# Patient Record
Sex: Female | Born: 1935 | Race: White | Hispanic: No | Smoking: Never smoker
Health system: Southern US, Community
[De-identification: ages and names within clinical notes are randomized; demographics above are authoritative.]

## PROBLEM LIST (undated history)

## (undated) DIAGNOSIS — D229 Melanocytic nevi, unspecified: Secondary | ICD-10-CM

## (undated) DIAGNOSIS — I359 Nonrheumatic aortic valve disorder, unspecified: Secondary | ICD-10-CM

## (undated) DIAGNOSIS — N6459 Other signs and symptoms in breast: Secondary | ICD-10-CM

## (undated) DIAGNOSIS — R42 Dizziness and giddiness: Secondary | ICD-10-CM

## (undated) HISTORY — PX: BACK SURGERY: SHX140

## (undated) HISTORY — DX: Nonrheumatic aortic valve disorder, unspecified: I35.9

## (undated) HISTORY — DX: Melanocytic nevi, unspecified: D22.9

## (undated) HISTORY — DX: Dizziness and giddiness: R42

## (undated) HISTORY — PX: APPENDECTOMY: SHX54

## (undated) HISTORY — DX: Other signs and symptoms in breast: N64.59

---

## 2003-09-25 ENCOUNTER — Encounter: Admission: RE | Admit: 2003-09-25 | Discharge: 2003-09-25 | Payer: Self-pay | Admitting: Internal Medicine

## 2004-01-18 ENCOUNTER — Ambulatory Visit: Payer: Self-pay | Admitting: Internal Medicine

## 2004-01-26 ENCOUNTER — Ambulatory Visit: Payer: Self-pay | Admitting: Gastroenterology

## 2004-02-09 ENCOUNTER — Ambulatory Visit: Payer: Self-pay | Admitting: Gastroenterology

## 2004-12-07 ENCOUNTER — Encounter: Admission: RE | Admit: 2004-12-07 | Discharge: 2004-12-07 | Payer: Self-pay | Admitting: Internal Medicine

## 2004-12-26 ENCOUNTER — Ambulatory Visit: Payer: Self-pay | Admitting: Internal Medicine

## 2004-12-30 ENCOUNTER — Encounter: Admission: RE | Admit: 2004-12-30 | Discharge: 2004-12-30 | Payer: Self-pay | Admitting: Internal Medicine

## 2005-07-19 ENCOUNTER — Ambulatory Visit: Payer: Self-pay | Admitting: Internal Medicine

## 2005-07-20 ENCOUNTER — Ambulatory Visit (HOSPITAL_COMMUNITY): Admission: RE | Admit: 2005-07-20 | Discharge: 2005-07-20 | Payer: Self-pay | Admitting: Internal Medicine

## 2005-07-20 ENCOUNTER — Encounter (INDEPENDENT_AMBULATORY_CARE_PROVIDER_SITE_OTHER): Payer: Self-pay | Admitting: *Deleted

## 2005-11-02 ENCOUNTER — Ambulatory Visit: Payer: Self-pay | Admitting: Internal Medicine

## 2006-10-24 ENCOUNTER — Ambulatory Visit: Payer: Self-pay | Admitting: Internal Medicine

## 2007-01-09 DIAGNOSIS — E785 Hyperlipidemia, unspecified: Secondary | ICD-10-CM | POA: Insufficient documentation

## 2007-01-09 DIAGNOSIS — J309 Allergic rhinitis, unspecified: Secondary | ICD-10-CM

## 2007-01-24 ENCOUNTER — Ambulatory Visit: Payer: Self-pay | Admitting: Internal Medicine

## 2007-01-24 DIAGNOSIS — M899 Disorder of bone, unspecified: Secondary | ICD-10-CM | POA: Insufficient documentation

## 2007-01-24 DIAGNOSIS — M545 Low back pain: Secondary | ICD-10-CM

## 2007-01-24 DIAGNOSIS — M949 Disorder of cartilage, unspecified: Secondary | ICD-10-CM

## 2007-01-24 LAB — CONVERTED CEMR LAB
ALT: 17 units/L (ref 0–35)
AST: 19 units/L (ref 0–37)
Albumin: 3.7 g/dL (ref 3.5–5.2)
Alkaline Phosphatase: 83 units/L (ref 39–117)
BUN: 15 mg/dL (ref 6–23)
Basophils Absolute: 0 10*3/uL (ref 0.0–0.1)
Basophils Relative: 0.3 % (ref 0.0–1.0)
Bilirubin, Direct: 0.1 mg/dL (ref 0.0–0.3)
CO2: 29 meq/L (ref 19–32)
Calcium: 9.5 mg/dL (ref 8.4–10.5)
Chloride: 106 meq/L (ref 96–112)
Cholesterol: 217 mg/dL (ref 0–200)
Creatinine, Ser: 1.1 mg/dL (ref 0.4–1.2)
Direct LDL: 141.4 mg/dL
Eosinophils Absolute: 0.3 10*3/uL (ref 0.0–0.6)
Eosinophils Relative: 3.3 % (ref 0.0–5.0)
GFR calc Af Amer: 62 mL/min
GFR calc non Af Amer: 51 mL/min
Glucose, Bld: 96 mg/dL (ref 70–99)
HCT: 38.9 % (ref 36.0–46.0)
HDL: 46.5 mg/dL (ref >39.0)
Hemoglobin: 13.5 g/dL (ref 12.0–15.0)
Lymphocytes Relative: 31.7 % (ref 12.0–46.0)
MCHC: 34.6 g/dL (ref 30.0–36.0)
MCV: 94.4 fL (ref 78.0–100.0)
Monocytes Absolute: 0.6 10*3/uL (ref 0.2–0.7)
Monocytes Relative: 8.3 % (ref 3.0–11.0)
Neutro Abs: 4.4 10*3/uL (ref 1.4–7.7)
Neutrophils Relative %: 56.4 % (ref 43.0–77.0)
Platelets: 214 10*3/uL (ref 150–400)
Potassium: 5.3 meq/L — ABNORMAL HIGH (ref 3.5–5.1)
RBC: 4.12 M/uL (ref 3.87–5.11)
RDW: 12.6 % (ref 11.5–14.6)
Sodium: 142 meq/L (ref 135–145)
TSH: 2 microintl units/mL (ref 0.35–5.50)
Total Bilirubin: 0.6 mg/dL (ref 0.3–1.2)
Total CHOL/HDL Ratio: 4.7
Total Protein: 6.8 g/dL (ref 6.0–8.3)
Triglycerides: 125 mg/dL (ref 0–149)
VLDL: 25 mg/dL (ref 0–40)
WBC: 7.8 10*3/uL (ref 4.5–10.5)

## 2007-02-15 ENCOUNTER — Encounter: Admission: RE | Admit: 2007-02-15 | Discharge: 2007-02-15 | Payer: Self-pay | Admitting: Internal Medicine

## 2007-10-22 ENCOUNTER — Ambulatory Visit: Payer: Self-pay | Admitting: Internal Medicine

## 2007-12-10 ENCOUNTER — Telehealth: Payer: Self-pay | Admitting: Internal Medicine

## 2008-01-07 ENCOUNTER — Ambulatory Visit: Payer: Self-pay | Admitting: Family Medicine

## 2008-02-26 ENCOUNTER — Ambulatory Visit: Payer: Self-pay | Admitting: Internal Medicine

## 2008-02-26 DIAGNOSIS — J069 Acute upper respiratory infection, unspecified: Secondary | ICD-10-CM | POA: Insufficient documentation

## 2008-08-31 ENCOUNTER — Encounter: Payer: Self-pay | Admitting: Internal Medicine

## 2008-08-31 ENCOUNTER — Encounter: Payer: Self-pay | Admitting: Cardiovascular Disease

## 2008-10-06 ENCOUNTER — Ambulatory Visit: Payer: Self-pay | Admitting: Internal Medicine

## 2008-11-23 ENCOUNTER — Telehealth: Payer: Self-pay | Admitting: Internal Medicine

## 2008-11-27 ENCOUNTER — Ambulatory Visit: Payer: Self-pay | Admitting: Internal Medicine

## 2008-11-27 DIAGNOSIS — L821 Other seborrheic keratosis: Secondary | ICD-10-CM

## 2008-12-23 ENCOUNTER — Encounter (INDEPENDENT_AMBULATORY_CARE_PROVIDER_SITE_OTHER): Payer: Self-pay | Admitting: *Deleted

## 2009-05-31 ENCOUNTER — Encounter: Payer: Self-pay | Admitting: Internal Medicine

## 2009-12-06 ENCOUNTER — Encounter: Payer: Self-pay | Admitting: Internal Medicine

## 2009-12-06 ENCOUNTER — Ambulatory Visit: Payer: Self-pay | Admitting: Cardiovascular Disease

## 2010-02-10 NOTE — Letter (Signed)
Summary: [facility]   Imported By: Maryln Gottron 12/14/2009 13:03:03  _____________________________________________________________________  External Attachment:    Type:   Image     Comment:   External Document

## 2010-02-10 NOTE — Letter (Signed)
Summary: [facility]   Imported By: Maryln Gottron 06/04/2009 14:53:49  _____________________________________________________________________  External Attachment:    Type:   Image     Comment:   External Document

## 2010-06-22 ENCOUNTER — Telehealth: Payer: Self-pay | Admitting: *Deleted

## 2010-06-22 DIAGNOSIS — Z1231 Encounter for screening mammogram for malignant neoplasm of breast: Secondary | ICD-10-CM

## 2010-06-22 DIAGNOSIS — Z1239 Encounter for other screening for malignant neoplasm of breast: Secondary | ICD-10-CM

## 2010-06-22 NOTE — Telephone Encounter (Signed)
Spoke with pt - informed that when I try to order - [place] does not come up as a chioce for locations to be done [facility] does. I will send order there and when she schedules she needs to ask if they are within network. KIK

## 2010-06-22 NOTE — Telephone Encounter (Signed)
ok 

## 2010-06-22 NOTE — Telephone Encounter (Signed)
Pt needs order for mammogram sent to [facility]  [facility] is not a Location manager with her Financial controller.

## 2010-06-30 ENCOUNTER — Ambulatory Visit: Payer: Self-pay

## 2010-06-30 ENCOUNTER — Other Ambulatory Visit: Payer: Self-pay | Admitting: Internal Medicine

## 2010-06-30 ENCOUNTER — Ambulatory Visit
Admission: RE | Admit: 2010-06-30 | Discharge: 2010-06-30 | Disposition: A | Discharge status: Home / Self Care | Payer: Medicare Other | Source: Ambulatory Visit | Attending: Internal Medicine | Admitting: Internal Medicine

## 2010-06-30 DIAGNOSIS — Z1231 Encounter for screening mammogram for malignant neoplasm of breast: Secondary | ICD-10-CM

## 2010-06-30 DIAGNOSIS — N649 Disorder of breast, unspecified: Secondary | ICD-10-CM

## 2010-07-07 ENCOUNTER — Ambulatory Visit
Admission: RE | Admit: 2010-07-07 | Discharge: 2010-07-07 | Disposition: A | Discharge status: Home / Self Care | Payer: Medicare Other | Source: Ambulatory Visit | Attending: Internal Medicine | Admitting: Internal Medicine

## 2010-07-07 DIAGNOSIS — N649 Disorder of breast, unspecified: Secondary | ICD-10-CM

## 2010-07-19 ENCOUNTER — Other Ambulatory Visit (INDEPENDENT_AMBULATORY_CARE_PROVIDER_SITE_OTHER): Payer: Self-pay | Admitting: Surgery

## 2010-07-19 ENCOUNTER — Ambulatory Visit
Admission: RE | Admit: 2010-07-19 | Discharge: 2010-07-19 | Disposition: A | Discharge status: Home / Self Care | Payer: Medicare Other | Source: Ambulatory Visit | Attending: Surgery | Admitting: Surgery

## 2010-07-19 ENCOUNTER — Encounter (INDEPENDENT_AMBULATORY_CARE_PROVIDER_SITE_OTHER): Payer: Self-pay | Admitting: Surgery

## 2010-07-19 ENCOUNTER — Ambulatory Visit (INDEPENDENT_AMBULATORY_CARE_PROVIDER_SITE_OTHER): Payer: Medicare Other | Admitting: Surgery

## 2010-07-19 ENCOUNTER — Ambulatory Visit
Admission: RE | Admit: 2010-07-19 | Discharge: 2010-07-19 | Disposition: A | Discharge status: Home / Self Care | Payer: Medicare Other | Source: Ambulatory Visit

## 2010-07-19 VITALS — BP 118/68 | HR 72 | Temp 96.6°F | Ht 64.0 in | Wt 157.6 lb

## 2010-07-19 DIAGNOSIS — N6320 Unspecified lump in the left breast, unspecified quadrant: Secondary | ICD-10-CM

## 2010-07-19 DIAGNOSIS — N6452 Nipple discharge: Secondary | ICD-10-CM | POA: Insufficient documentation

## 2010-07-19 DIAGNOSIS — N63 Unspecified lump in unspecified breast: Secondary | ICD-10-CM

## 2010-07-19 DIAGNOSIS — N6459 Other signs and symptoms in breast: Secondary | ICD-10-CM

## 2010-07-19 NOTE — Progress Notes (Signed)
Subjective:     Patient ID: Natalie Fitzgerald, female   DOB: [birth year], 77 y.o.   MRN: [id number]    BP 118/68  Pulse 72  Temp 96.6 F (35.9 C)  Ht 5\' 4"  (1.626 m)  Wt 157 lb 9.6 oz (71.487 kg)  BMI 27.05 kg/m2    HPI The patient recently noted a tiny nodule in the tip of the right nipple periods otherwise been asymptomatic. She doesn't she's had one deep breasts in the past. She's had no symptoms from the area of the nipple. The patient recently had a mammogram which she says was negative. She came here for surgical evaluation.  Review of Systems  Constitutional: Negative.   HENT: Negative.   Eyes: Negative.   Cardiovascular: Negative.   Gastrointestinal: Negative.   Genitourinary: Negative.   Musculoskeletal: Negative.   Neurological: Negative.   Hematological: Negative.   Psychiatric/Behavioral: Negative.        Objective:   Physical Exam  Constitutional: She is oriented to person, place, and time. She appears well-developed and well-nourished.  HENT:  Head: Normocephalic.  Eyes: Conjunctivae are normal. Pupils are equal, round, and reactive to light.  Neck: Normal range of motion. No thyromegaly present.  Cardiovascular: Normal rate, regular rhythm and normal heart sounds.   Pulmonary/Chest: Effort normal and breath sounds normal. Right breast exhibits nipple discharge. Right breast exhibits no inverted nipple and no tenderness. Left breast exhibits mass. Left breast exhibits no inverted nipple, no nipple discharge, no skin change and no tenderness. Breasts are symmetrical.         Tiny white 1mm nodule tip of R nipple, looks like dilated gland. Small amount clear nipple dc R. Mass like area high L UOQ  Abdominal: Soft. She exhibits no distension and no mass. There is no tenderness.  Neurological: She is alert and oriented to person, place, and time.  Skin: Skin is warm and dry.  Psychiatric: She has a normal mood and affect. Her behavior is normal. Judgment and  thought content normal.   Data reviewed: I went over her old records and attic as well as the mammogram an old ultrasound reports.   Assessment:     This small nodular density on tip of nipple which appears benign and likely were present a slightly dilated land. A small amount of clear nipple discharge is likewise benign she she has no spontaneous discharge. There is a soft about 3 cm masslike effect in the upper outer quadrant of the left breast. She says that's been there previously but to me it is slightly suspicious although it thank benign.    Plan:     I don't think we need to do anything with the small larger area on the tip of the nipple nor the nipple discharge was to become spontaneous. We are going to obtain an ultrasound of the upper outer quadrant of the left breast to be sure there is not a small mass there. It is negative she can just continue followup with her primary physician.

## 2010-07-19 NOTE — Patient Instructions (Signed)
Go to the breast center now for an ultrasoound of the left breast. If its OK we will see you again on an as needed basis

## 2010-09-13 ENCOUNTER — Encounter: Payer: Self-pay | Admitting: Internal Medicine

## 2010-09-13 ENCOUNTER — Ambulatory Visit (INDEPENDENT_AMBULATORY_CARE_PROVIDER_SITE_OTHER): Payer: Medicare Other | Admitting: Internal Medicine

## 2010-09-13 DIAGNOSIS — I10 Essential (primary) hypertension: Secondary | ICD-10-CM

## 2010-09-13 DIAGNOSIS — M899 Disorder of bone, unspecified: Secondary | ICD-10-CM

## 2010-09-13 MED ORDER — AMLODIPINE BESYLATE 2.5 MG PO TABS: 2.5000 mg | ORAL_TABLET | Freq: Every day | ORAL | Status: DC

## 2010-09-13 NOTE — Progress Notes (Signed)
  Subjective:    Patient ID: Natalie Fitzgerald, female    DOB: 02-11-1927, 77 y.o.   MRN: [id number]  HPI  77 year old patient who is seen today for followup. She has a history of mild dyslipidemia osteopenia and treated hypertension. Her blood pressure meds well controlled on amlodipine 2.5 mg daily. She has recently been seen by general surgery for a benign left breast lesion. She is doing quite well. He denies any cardiopulmonary complaints.    Review of Systems  Constitutional: Negative.   HENT: Negative for hearing loss, congestion, sore throat, rhinorrhea, dental problem, sinus pressure and tinnitus.   Eyes: Negative for pain, discharge and visual disturbance.  Respiratory: Negative for cough and shortness of breath.   Cardiovascular: Negative for chest pain, palpitations and leg swelling.  Gastrointestinal: Negative for nausea, vomiting, abdominal pain, diarrhea, constipation, blood in stool and abdominal distention.  Genitourinary: Negative for dysuria, urgency, frequency, hematuria, flank pain, vaginal bleeding, vaginal discharge, difficulty urinating, vaginal pain and pelvic pain.  Musculoskeletal: Negative for joint swelling, arthralgias and gait problem.  Skin: Negative for rash.  Neurological: Negative for dizziness, syncope, speech difficulty, weakness, numbness and headaches.  Hematological: Negative for adenopathy.  Psychiatric/Behavioral: Negative for behavioral problems, dysphoric mood and agitation. The patient is not nervous/anxious.        Objective:   Physical Exam  Constitutional: She is oriented to person, place, and time. She appears well-developed and well-nourished.  HENT:  Head: Normocephalic.  Right Ear: External ear normal.  Left Ear: External ear normal.  Mouth/Throat: Oropharynx is clear and moist.  Eyes: Conjunctivae and EOM are normal. Pupils are equal, round, and reactive to light.  Neck: Normal range of motion. Neck supple. No thyromegaly present.    Cardiovascular: Normal rate, regular rhythm and normal heart sounds.        Diminished right dorsalis pedis pulse  Pulmonary/Chest: Effort normal and breath sounds normal.  Abdominal: Soft. Bowel sounds are normal. She exhibits no mass. There is no tenderness.  Musculoskeletal: Normal range of motion.  Lymphadenopathy:    She has no cervical adenopathy.  Neurological: She is alert and oriented to person, place, and time.  Skin: Skin is warm and dry. No rash noted.  Psychiatric: She has a normal mood and affect. Her behavior is normal.          Assessment & Plan:   Hypertension. Well controlled Dyslipidemia  We'll schedule for a complete physical

## 2010-09-13 NOTE — Patient Instructions (Signed)
Limit your sodium (Salt) intake  Take a calcium supplement, plus 800-1200 units of vitamin D    It is important that you exercise regularly, at least 20 minutes 3 to 4 times per week.  If you develop chest pain or shortness of breath seek  medical attention.   

## 2010-10-31 ENCOUNTER — Telehealth: Payer: Self-pay | Admitting: Internal Medicine

## 2010-10-31 NOTE — Telephone Encounter (Signed)
30 min appt 

## 2010-10-31 NOTE — Telephone Encounter (Signed)
Daughter called and said that her mom may have slight dementia and/or Alzheimers. May need to be seen. 15 or 30 minutes for appt?

## 2011-01-19 ENCOUNTER — Encounter: Payer: Self-pay | Admitting: Gastroenterology

## 2011-06-01 ENCOUNTER — Other Ambulatory Visit: Payer: Self-pay | Admitting: Internal Medicine

## 2011-06-01 DIAGNOSIS — Z1231 Encounter for screening mammogram for malignant neoplasm of breast: Secondary | ICD-10-CM

## 2011-07-04 ENCOUNTER — Ambulatory Visit
Admission: RE | Admit: 2011-07-04 | Discharge: 2011-07-04 | Disposition: A | Discharge status: Home / Self Care | Payer: Medicare Other | Source: Ambulatory Visit | Attending: Internal Medicine | Admitting: Internal Medicine

## 2011-07-04 DIAGNOSIS — Z1231 Encounter for screening mammogram for malignant neoplasm of breast: Secondary | ICD-10-CM

## 2011-10-13 ENCOUNTER — Encounter: Payer: Self-pay | Admitting: Gastroenterology

## 2011-11-05 ENCOUNTER — Other Ambulatory Visit: Payer: Self-pay | Admitting: Internal Medicine

## 2011-11-30 ENCOUNTER — Ambulatory Visit (INDEPENDENT_AMBULATORY_CARE_PROVIDER_SITE_OTHER): Payer: Medicare Other | Admitting: Internal Medicine

## 2011-11-30 ENCOUNTER — Encounter: Payer: Self-pay | Admitting: Internal Medicine

## 2011-11-30 VITALS — BP 160/80 | HR 72 | Temp 98.1°F | Resp 18 | Wt 160.0 lb

## 2011-11-30 DIAGNOSIS — Z23 Encounter for immunization: Secondary | ICD-10-CM

## 2011-11-30 DIAGNOSIS — J309 Allergic rhinitis, unspecified: Secondary | ICD-10-CM

## 2011-11-30 DIAGNOSIS — E785 Hyperlipidemia, unspecified: Secondary | ICD-10-CM

## 2011-11-30 DIAGNOSIS — I10 Essential (primary) hypertension: Secondary | ICD-10-CM

## 2011-11-30 LAB — COMPREHENSIVE METABOLIC PANEL
ALT: 14 U/L (ref 0–35)
AST: 21 U/L (ref 0–37)
Albumin: 3.8 g/dL (ref 3.5–5.2)
Alkaline Phosphatase: 89 U/L (ref 39–117)
BUN: 30 mg/dL — ABNORMAL HIGH (ref 6–23)
CO2: 27 mEq/L (ref 19–32)
Chloride: 104 mEq/L (ref 96–112)
Glucose, Bld: 119 mg/dL — ABNORMAL HIGH (ref 70–99)
Potassium: 5.1 mEq/L (ref 3.5–5.1)
Sodium: 137 mEq/L (ref 135–145)
Total Protein: 7.4 g/dL (ref 6.0–8.3)

## 2011-11-30 LAB — CBC WITH DIFFERENTIAL/PLATELET
Basophils Absolute: 0 10*3/uL (ref 0.0–0.1)
Basophils Relative: 0.3 % (ref 0.0–3.0)
Eosinophils Absolute: 0.3 10*3/uL (ref 0.0–0.7)
Eosinophils Relative: 3.4 % (ref 0.0–5.0)
Hemoglobin: 12.3 g/dL (ref 12.0–15.0)
Lymphocytes Relative: 24.5 % (ref 12.0–46.0)
Lymphs Abs: 2.2 10*3/uL (ref 0.7–4.0)
MCHC: 32.7 g/dL (ref 30.0–36.0)
MCV: 96.4 fl (ref 78.0–100.0)
Monocytes Absolute: 0.6 10*3/uL (ref 0.1–1.0)
Monocytes Relative: 6.4 % (ref 3.0–12.0)
Neutro Abs: 5.9 10*3/uL (ref 1.4–7.7)
Neutrophils Relative %: 65.4 % (ref 43.0–77.0)
RBC: 3.91 Mil/uL (ref 3.87–5.11)
RDW: 13.7 % (ref 11.5–14.6)
WBC: 9.1 10*3/uL (ref 4.5–10.5)

## 2011-11-30 LAB — LIPID PANEL
Cholesterol: 198 mg/dL (ref 0–200)
LDL Cholesterol: 119 mg/dL — ABNORMAL HIGH (ref 0–99)
Total CHOL/HDL Ratio: 4
Triglycerides: 156 mg/dL — ABNORMAL HIGH (ref 0.0–149.0)

## 2011-11-30 MED ORDER — AMLODIPINE BESYLATE 2.5 MG PO TABS: 2.5000 mg | ORAL_TABLET | Freq: Every day | ORAL | Status: DC

## 2011-11-30 NOTE — Progress Notes (Signed)
Subjective:    Patient ID: Natalie Fitzgerald, female    DOB: 04-08-1927, 78 y.o.   MRN: [id number]  HPI  78 year old patient who is seen today for followup. She has not been seen here in over one year. She has treated hypertension which has done quite well. She has mild dyslipidemia history of allergic rhinitis and mild osteoarthritis. Doing quite well no major concerns or complaints  Past Medical History  Diagnosis Date  . Nipple problem   . Aortic valve disorders     hardening of valve`  . Atypical mole     nose  . Glaucoma(365)   . Dizziness     History   Social History  . Marital Status: Married    Spouse Name: N/A    Number of Children: N/A  . Years of Education: N/A   Occupational History  . Not on file.   Social History Main Topics  . Smoking status: Never Smoker   . Smokeless tobacco: Not on file  . Alcohol Use: No  . Drug Use: No  . Sexually Active:    Other Topics Concern  . Not on file   Social History Narrative  . No narrative on file    Past Surgical History  Procedure Date  . Appendectomy   . Back surgery     Family History  Problem Relation Age of Onset  . Cancer Mother     kidney  . Alcohol abuse Father     No Known Allergies  Current Outpatient Prescriptions on File Prior to Visit  Medication Sig Dispense Refill  . amLODipine (NORVASC) 2.5 MG tablet TAKE ONE TABLET BY MOUTH EVERY DAY  90 tablet  0  . aspirin 81 MG tablet Take 81 mg by mouth daily.        . Cholecalciferol (VITAMIN D-3 PO) Take 500 mg by mouth daily.        . Multiple Vitamin (MULTIVITAMINS PO) Take by mouth daily.          BP 160/80  Pulse 72  Temp 98.1 F (36.7 C) (Oral)  Resp 18  Wt 160 lb (72.576 kg)  SpO2 97%      Review of Systems  Constitutional: Negative.   HENT: Negative for hearing loss, congestion, sore throat, rhinorrhea, dental problem, sinus pressure and tinnitus.   Eyes: Negative for pain, discharge and visual disturbance.  Respiratory:  Negative for cough and shortness of breath.   Cardiovascular: Negative for chest pain, palpitations and leg swelling.  Gastrointestinal: Negative for nausea, vomiting, abdominal pain, diarrhea, constipation, blood in stool and abdominal distention.  Genitourinary: Negative for dysuria, urgency, frequency, hematuria, flank pain, vaginal bleeding, vaginal discharge, difficulty urinating, vaginal pain and pelvic pain.  Musculoskeletal: Negative for joint swelling, arthralgias and gait problem.  Skin: Negative for rash.  Neurological: Negative for dizziness, syncope, speech difficulty, weakness, numbness and headaches.  Hematological: Negative for adenopathy.  Psychiatric/Behavioral: Negative for behavioral problems, dysphoric mood and agitation. The patient is not nervous/anxious.        Objective:   Physical Exam  Constitutional: She is oriented to person, place, and time. She appears well-developed and well-nourished.       Repeat blood pressure 130/60  HENT:  Head: Normocephalic.  Right Ear: External ear normal.  Left Ear: External ear normal.  Mouth/Throat: Oropharynx is clear and moist.  Eyes: Conjunctivae normal and EOM are normal. Pupils are equal, round, and reactive to light.  Neck: Normal range of motion. Neck supple. No thyromegaly  present.  Cardiovascular: Normal rate, regular rhythm and normal heart sounds.        Pedal pulses full except absent right dorsalis pedis pulse  Pulmonary/Chest: Effort normal and breath sounds normal.  Abdominal: Soft. Bowel sounds are normal. She exhibits no mass. There is no tenderness.  Musculoskeletal: Normal range of motion.  Lymphadenopathy:    She has no cervical adenopathy.  Neurological: She is alert and oriented to person, place, and time.  Skin: Skin is warm and dry. No rash noted.  Psychiatric: She has a normal mood and affect. Her behavior is normal.          Assessment & Plan:   Hypertension well controlled. Repeat blood  pressure 130/60 History dyslipidemia Osteopenia allergic rhinitis   Medications refilled Lab updated  CPX one year

## 2011-11-30 NOTE — Patient Instructions (Signed)
Limit your sodium (Salt) intake    It is important that you exercise regularly, at least 20 minutes 3 to 4 times per week.  If you develop chest pain or shortness of breath seek  medical attention.  Return in one year for follow-up  Take a calcium supplement, plus 800-1200 units of vitamin D 

## 2012-12-23 ENCOUNTER — Ambulatory Visit (INDEPENDENT_AMBULATORY_CARE_PROVIDER_SITE_OTHER): Payer: Medicare Other | Admitting: Family

## 2012-12-23 ENCOUNTER — Encounter: Payer: Self-pay | Admitting: Family

## 2012-12-23 VITALS — BP 142/80 | HR 71 | Ht 62.0 in | Wt 160.0 lb

## 2012-12-23 DIAGNOSIS — I10 Essential (primary) hypertension: Secondary | ICD-10-CM

## 2012-12-23 DIAGNOSIS — E785 Hyperlipidemia, unspecified: Secondary | ICD-10-CM

## 2012-12-23 DIAGNOSIS — Z23 Encounter for immunization: Secondary | ICD-10-CM

## 2012-12-23 DIAGNOSIS — R82998 Other abnormal findings in urine: Secondary | ICD-10-CM

## 2012-12-23 DIAGNOSIS — R8290 Unspecified abnormal findings in urine: Secondary | ICD-10-CM

## 2012-12-23 DIAGNOSIS — Z0000 Encounter for general adult medical examination without abnormal findings: Secondary | ICD-10-CM

## 2012-12-23 DIAGNOSIS — J309 Allergic rhinitis, unspecified: Secondary | ICD-10-CM

## 2012-12-23 LAB — POCT URINALYSIS DIPSTICK
Bilirubin, UA: NEGATIVE
Blood, UA: NEGATIVE
Glucose, UA: NEGATIVE
Ketones, UA: NEGATIVE
Nitrite, UA: POSITIVE
Protein, UA: NEGATIVE
Spec Grav, UA: 1.02
Urobilinogen, UA: 0.2
pH, UA: 5.5

## 2012-12-23 MED ORDER — AMLODIPINE BESYLATE 2.5 MG PO TABS: 2.5000 mg | ORAL_TABLET | Freq: Every day | ORAL | Status: DC

## 2012-12-23 NOTE — Progress Notes (Signed)
Subjective:    Patient ID: Natalie Fitzgerald, female    DOB: Oct 01, 1927, 79 y.o.   MRN: [id number]  HPI 79 year old white female, nonsmoker, patient of Dr. Kirtland Bouchard is in for a routine physical examination for this healthy  Female. Reviewed all health maintenance protocols including mammography colonoscopy bone density and reviewed appropriate screening labs. Her immunization history was reviewed as well as her current medications and allergies refills of her chronic medications were given and the plan for yearly health maintenance was discussed all orders and referrals were made as appropriate.   Review of Systems  Constitutional: Negative.   HENT: Negative.   Eyes: Negative.   Respiratory: Negative.   Cardiovascular: Negative.   Gastrointestinal: Negative.   Endocrine: Negative.   Genitourinary: Negative.   Musculoskeletal: Negative.   Skin: Negative.   Allergic/Immunologic: Negative.   Neurological: Negative.   Hematological: Negative.   Psychiatric/Behavioral: Negative.    Past Medical History  Diagnosis Date  . Nipple problem   . Aortic valve disorders     hardening of valve`  . Atypical mole     nose  . Glaucoma   . Dizziness     History   Social History  . Marital Status: Married    Spouse Name: N/A    Number of Children: N/A  . Years of Education: N/A   Occupational History  . Not on file.   Social History Main Topics  . Smoking status: Never Smoker   . Smokeless tobacco: Not on file  . Alcohol Use: No  . Drug Use: No  . Sexual Activity:    Other Topics Concern  . Not on file   Social History Narrative  . No narrative on file    Past Surgical History  Procedure Laterality Date  . Appendectomy    . Back surgery      Family History  Problem Relation Age of Onset  . Cancer Mother     kidney  . Alcohol abuse Father     No Known Allergies  Current Outpatient Prescriptions on File Prior to Visit  Medication Sig Dispense Refill  . aspirin 81 MG  tablet Take 81 mg by mouth daily.        . Cholecalciferol (VITAMIN D-3 PO) Take 500 mg by mouth daily.        . Multiple Vitamin (MULTIVITAMINS PO) Take by mouth daily.         No current facility-administered medications on file prior to visit.    BP 142/80  Pulse 71  Ht 5\' 2"  (1.575 m)  Wt 160 lb (72.576 kg)  BMI 29.26 kg/m2chart    Objective:   Physical Exam  Constitutional: She is oriented to person, place, and time. She appears well-developed and well-nourished.  HENT:  Head: Normocephalic.  Right Ear: External ear normal.  Left Ear: External ear normal.  Nose: Nose normal.  Mouth/Throat: Oropharynx is clear and moist.  Eyes: Conjunctivae and EOM are normal. Pupils are equal, round, and reactive to light.  Neck: Normal range of motion. Neck supple. No thyromegaly present.  Cardiovascular: Normal rate, regular rhythm and normal heart sounds.   Pulmonary/Chest: Effort normal and breath sounds normal.  Abdominal: Soft. Bowel sounds are normal. She exhibits no distension. There is no tenderness. There is no rebound.  Musculoskeletal: She exhibits no edema and no tenderness.  Neurological: She is alert and oriented to person, place, and time. She has normal reflexes. She displays normal reflexes. No cranial nerve deficit. Coordination  normal.  Skin: Skin is warm and dry.  Psychiatric: She has a normal mood and affect.          Assessment & Plan:  Assessment: 1. Complete Physical Exam 2. Hyperlipidemia 3. Hypertension   Plan: Continue current medications. Norvasc refill. Labs and to include UA, CBC, BMP, LFTs, and lipid. The patient and the results. Patient declines mammogram which is to wait till June of next year. Advised her to continue self breast exams and notify us with any issues.Was given tetanus and flu immunization.

## 2012-12-23 NOTE — Patient Instructions (Signed)

## 2012-12-24 LAB — CBC WITH DIFFERENTIAL/PLATELET
Basophils Absolute: 0.1 10*3/uL (ref 0.0–0.1)
Basophils Relative: 1 % (ref 0.0–3.0)
Eosinophils Absolute: 0.4 10*3/uL (ref 0.0–0.7)
Eosinophils Relative: 4.6 % (ref 0.0–5.0)
HCT: 35.3 % — ABNORMAL LOW (ref 36.0–46.0)
Hemoglobin: 11.9 g/dL — ABNORMAL LOW (ref 12.0–15.0)
Lymphocytes Relative: 28.4 % (ref 12.0–46.0)
Lymphs Abs: 2.5 10*3/uL (ref 0.7–4.0)
MCHC: 33.8 g/dL (ref 30.0–36.0)
MCV: 93.1 fl (ref 78.0–100.0)
Monocytes Absolute: 0.5 10*3/uL (ref 0.1–1.0)
Monocytes Relative: 5.8 % (ref 3.0–12.0)
Neutro Abs: 5.4 10*3/uL (ref 1.4–7.7)
Neutrophils Relative %: 60.2 % (ref 43.0–77.0)
Platelets: 208 10*3/uL (ref 150.0–400.0)
RBC: 3.79 Mil/uL — ABNORMAL LOW (ref 3.87–5.11)
RDW: 13.6 % (ref 11.5–14.6)
WBC: 8.9 10*3/uL (ref 4.5–10.5)

## 2012-12-24 LAB — BASIC METABOLIC PANEL
BUN: 30 mg/dL — ABNORMAL HIGH (ref 6–23)
CO2: 25 mEq/L (ref 19–32)
Calcium: 9.7 mg/dL (ref 8.4–10.5)
Chloride: 104 mEq/L (ref 96–112)
Creatinine, Ser: 1.5 mg/dL — ABNORMAL HIGH (ref 0.4–1.2)
GFR: 34.71 mL/min — ABNORMAL LOW (ref >60.00)
Glucose, Bld: 94 mg/dL (ref 70–99)
Potassium: 5.2 mEq/L — ABNORMAL HIGH (ref 3.5–5.1)
Sodium: 136 mEq/L (ref 135–145)

## 2012-12-24 LAB — HEPATIC FUNCTION PANEL
ALT: 15 U/L (ref 0–35)
AST: 22 U/L (ref 0–37)
Albumin: 3.8 g/dL (ref 3.5–5.2)
Alkaline Phosphatase: 84 U/L (ref 39–117)
Bilirubin, Direct: 0.1 mg/dL (ref 0.0–0.3)
Total Bilirubin: 0.4 mg/dL (ref 0.3–1.2)
Total Protein: 6.6 g/dL (ref 6.0–8.3)

## 2012-12-24 LAB — TSH: TSH: 2.91 u[IU]/mL (ref 0.35–5.50)

## 2012-12-24 LAB — LIPID PANEL
Cholesterol: 207 mg/dL — ABNORMAL HIGH (ref 0–200)
HDL: 46.2 mg/dL (ref >39.00)
Total CHOL/HDL Ratio: 4
VLDL: 42.6 mg/dL — ABNORMAL HIGH (ref 0.0–40.0)

## 2012-12-24 NOTE — Addendum Note (Signed)
Addended by: Rita Ohara R on: 12/24/2012 12:57 PM   Modules accepted: Orders

## 2012-12-26 ENCOUNTER — Telehealth: Payer: Self-pay

## 2012-12-26 LAB — URINE CULTURE

## 2012-12-26 MED ORDER — SULFAMETHOXAZOLE-TMP DS 800-160 MG PO TABS: 1.0000 | ORAL_TABLET | Freq: Two times a day (BID) | ORAL | Status: DC

## 2012-12-26 NOTE — Telephone Encounter (Signed)
Pt aware and verbalized understanding.  

## 2012-12-26 NOTE — Telephone Encounter (Signed)
Message copied by Beverely Low on Thu Dec 26, 2012  9:19 AM ------      Message from: Adline Mango B      Created: Thu Dec 26, 2012  8:42 AM       Positive uti ------

## 2013-01-03 ENCOUNTER — Emergency Department (HOSPITAL_BASED_OUTPATIENT_CLINIC_OR_DEPARTMENT_OTHER)
Admission: EM | Admit: 2013-01-03 | Discharge: 2013-01-03 | Disposition: A | Discharge status: Home / Self Care | Payer: Medicare Other | Attending: Emergency Medicine | Admitting: Emergency Medicine

## 2013-01-03 ENCOUNTER — Emergency Department (HOSPITAL_BASED_OUTPATIENT_CLINIC_OR_DEPARTMENT_OTHER): Payer: Medicare Other

## 2013-01-03 ENCOUNTER — Encounter (HOSPITAL_BASED_OUTPATIENT_CLINIC_OR_DEPARTMENT_OTHER): Payer: Self-pay | Admitting: Emergency Medicine

## 2013-01-03 DIAGNOSIS — Z8679 Personal history of other diseases of the circulatory system: Secondary | ICD-10-CM | POA: Insufficient documentation

## 2013-01-03 DIAGNOSIS — Y929 Unspecified place or not applicable: Secondary | ICD-10-CM | POA: Insufficient documentation

## 2013-01-03 DIAGNOSIS — Z8669 Personal history of other diseases of the nervous system and sense organs: Secondary | ICD-10-CM | POA: Insufficient documentation

## 2013-01-03 DIAGNOSIS — S8990XA Unspecified injury of unspecified lower leg, initial encounter: Secondary | ICD-10-CM | POA: Insufficient documentation

## 2013-01-03 DIAGNOSIS — Z7982 Long term (current) use of aspirin: Secondary | ICD-10-CM | POA: Insufficient documentation

## 2013-01-03 DIAGNOSIS — Y9389 Activity, other specified: Secondary | ICD-10-CM | POA: Insufficient documentation

## 2013-01-03 DIAGNOSIS — M25561 Pain in right knee: Secondary | ICD-10-CM

## 2013-01-03 DIAGNOSIS — Z79899 Other long term (current) drug therapy: Secondary | ICD-10-CM | POA: Insufficient documentation

## 2013-01-03 MED ORDER — CYCLOBENZAPRINE HCL 5 MG PO TABS: 5.0000 mg | ORAL_TABLET | Freq: Three times a day (TID) | ORAL | Status: DC | PRN

## 2013-01-03 MED ORDER — IBUPROFEN 600 MG PO TABS: 600.0000 mg | ORAL_TABLET | Freq: Four times a day (QID) | ORAL | Status: DC | PRN

## 2013-01-03 NOTE — ED Notes (Signed)
Pt c/o right knee injury getting into car x 1 hr ago

## 2013-01-03 NOTE — ED Provider Notes (Signed)
CSN: [id number]     Arrival date & time 01/03/13  4098 History  This chart was scribed for non-physician practitioner Elpidio Anis working with Hurman Horn, MD by Carl Best, ED Scribe. This patient was seen in room [facility]2/[facility] and the patient's care was started at 9:09 PM.     Chief Complaint  Patient presents with  . Knee Pain    Patient is a 79 y.o. female presenting with knee pain. The history is provided by the patient. No language interpreter was used.  Knee Pain  HPI Comments: Natalie Fitzgerald is a 79 y.o. female who presents to the Emergency Department complaining of constant right knee pain that started last night while the patient was lying on her right side.  She states that when she turned over to her left side, the right knee pain subsided.  She states that while getting into her car this afternoon, the right knee pain returned and radiated to her right heel.  She states that the pain continued throughout the day but has subsided since being in the ED.  She states that the ACE wrap helped alleviate her right knee pain.  The patient states that elevating her right leg helped alleviate her right heel pain.  She denies experiencing these symptoms in the past.  The patient denies having any allergies to medications.  The patient denies seeing an orthopedist int he past.  The patient states that she lives by herself.   Past Medical History  Diagnosis Date  . Nipple problem   . Aortic valve disorders     hardening of valve`  . Atypical mole     nose  . Glaucoma   . Dizziness    Past Surgical History  Procedure Laterality Date  . Appendectomy    . Back surgery     Family History  Problem Relation Age of Onset  . Cancer Mother     kidney  . Alcohol abuse Father    History  Substance Use Topics  . Smoking status: Never Smoker   . Smokeless tobacco: Not on file  . Alcohol Use: No   OB History   Grav Para Term Preterm Abortions TAB SAB Ect Mult Living                  Review of Systems  Musculoskeletal: Positive for arthralgias (right knee).  All other systems reviewed and are negative.    Allergies  Review of patient's allergies indicates no known allergies.  Home Medications   Current Outpatient Rx  Name  Route  Sig  Dispense  Refill  . amLODipine (NORVASC) 2.5 MG tablet   Oral   Take 1 tablet (2.5 mg total) by mouth daily.   90 tablet   6     Dispense as written.   Marland Kitchen aspirin 81 MG tablet   Oral   Take 81 mg by mouth daily.           . Cholecalciferol (VITAMIN D-3 PO)   Oral   Take 500 mg by mouth daily.           . Multiple Vitamin (MULTIVITAMINS PO)   Oral   Take by mouth daily.           Marland Kitchen sulfamethoxazole-trimethoprim (BACTRIM DS) 800-160 MG per tablet   Oral   Take 1 tablet by mouth 2 (two) times daily.   14 tablet   0    Triage Vitals: BP 143/76  Pulse 92  Temp(Src)  98 F (36.7 C) (Oral)  Ht 5\' 3"  (1.6 m)  Wt 160 lb (72.576 kg)  BMI 28.35 kg/m2  SpO2 98%  Physical Exam  Nursing note and vitals reviewed. Constitutional: She is oriented to person, place, and time. She appears well-developed and well-nourished.  HENT:  Head: Normocephalic and atraumatic.  Eyes: Conjunctivae and EOM are normal. Pupils are equal, round, and reactive to light.  Neck: Normal range of motion and phonation normal. Neck supple.  Cardiovascular:  Distal pulses intact.    Pulmonary/Chest: Effort normal.  Musculoskeletal: Normal range of motion.  Right knee no swelling.  No redness.  Joint stable.  Focal tenderness to the posterolateral aspect of the knee without proximal or distal tenderness.    No tenderness or swelling to the right heel or foot.  Neurological: She is alert and oriented to person, place, and time. She exhibits normal muscle tone.  Skin: Skin is warm and dry.  Psychiatric: She has a normal mood and affect. Her behavior is normal. Judgment and thought content normal.    ED Course  Procedures (including  critical care time)  DIAGNOSTIC STUDIES: Oxygen Saturation is 98% on room air, normal by my interpretation.    COORDINATION OF CARE: 9:12 PM- Discussed a clinical suspicion of a soft tissue pain.  Discussed discharging the patient with an immobilizer and a muscle relaxant.  Advised the patient to follow up with an orthopedist and to take Motrin or Advil.  The patient agreed to the treatment plan.    Labs Review Labs Reviewed - No data to display Imaging Review Dg Knee Complete 4 Views Right  01/03/2013   CLINICAL DATA:  Twisting injury to the right knee.  Pain.  EXAM: RIGHT KNEE - COMPLETE 4+ VIEW  COMPARISON:  None.  FINDINGS: No fracture. The knee joint is normally aligned. There is mild narrowing of the patellofemoral joint space compartment. Chondrocalcinosis is noted along the menisci. Minimal marginal osteophytes project from the superior and inferior patella.  The bones are demineralized. There is no joint effusion. The soft tissues are unremarkable.  IMPRESSION: No fracture or dislocation or acute finding.   Electronically Signed   By: Amie Portland M.D.   On: 01/03/2013 20:03    EKG Interpretation   None       MDM  No diagnosis found. 1. Right knee pain\  Suspect pain is related to muscle or ligamentous strain. Doubt DVT, articular effusion. Knee immobilizer with improved comfort. Ambulatory. Desires to go home. Recommended ortho follow up.  I personally performed the services described in this documentation, which was scribed in my presence. The recorded information has been reviewed and is accurate.     Arnoldo Hooker, PA-C 01/04/13 0005

## 2013-01-04 NOTE — ED Provider Notes (Signed)
Medical screening examination/treatment/procedure(s) were performed by non-physician practitioner and as supervising physician I was immediately available for consultation/collaboration.   Kert Shackett M Tierre Gerard, MD 01/04/13 1332 

## 2013-07-22 ENCOUNTER — Encounter: Payer: Self-pay | Admitting: Internal Medicine

## 2013-07-22 ENCOUNTER — Ambulatory Visit (INDEPENDENT_AMBULATORY_CARE_PROVIDER_SITE_OTHER): Payer: Commercial Managed Care - HMO | Admitting: Internal Medicine

## 2013-07-22 VITALS — BP 122/76 | HR 65 | Temp 98.2°F | Resp 20 | Ht 63.0 in | Wt 154.0 lb

## 2013-07-22 DIAGNOSIS — I10 Essential (primary) hypertension: Secondary | ICD-10-CM

## 2013-07-22 DIAGNOSIS — J309 Allergic rhinitis, unspecified: Secondary | ICD-10-CM

## 2013-07-22 DIAGNOSIS — L989 Disorder of the skin and subcutaneous tissue, unspecified: Secondary | ICD-10-CM

## 2013-07-22 NOTE — Progress Notes (Signed)
Pre visit review using our clinic review tool, if applicable. No additional management support is needed unless otherwise documented below in the visit note. 

## 2013-07-22 NOTE — Patient Instructions (Signed)
Limit your sodium (Salt) intake  Please check your blood pressure on a regular basis.  If it is consistently greater than 150/90, please make an office appointment.  Return in 6 months for follow-up and CPX  Use hydrocortisone 1% cream applied twice daily to the left side of her nose for 10 days only (do  not use chronically)  will need dermatology referral.  If rash persists or reoccurs

## 2013-07-22 NOTE — Progress Notes (Signed)
Subjective:    Patient ID: Natalie Fitzgerald, female    DOB: 1928/01/07, 80 y.o.   MRN: [id number]  HPI 80 year old patient who has a history of hypertension and also history of allergic rhinitis.  She presents today with a chief complaint of a lesion involving the left side of her nose.  She states that approximately 15-20 years ago.  She was seen by dermatology for removal of a "precancerous dark mole"involving the same area.  For the past 3 months.  She has had some chronic scaliness involving this area.  That has been refractory to moisturizing creams and lotions Past Medical History  Diagnosis Date  . Nipple problem   . Aortic valve disorders     hardening of valve`  . Atypical mole     nose  . Glaucoma   . Dizziness     History   Social History  . Marital Status: Married    Spouse Name: N/A    Number of Children: N/A  . Years of Education: N/A   Occupational History  . Not on file.   Social History Main Topics  . Smoking status: Never Smoker   . Smokeless tobacco: Not on file  . Alcohol Use: No  . Drug Use: No  . Sexual Activity:    Other Topics Concern  . Not on file   Social History Narrative  . No narrative on file    Past Surgical History  Procedure Laterality Date  . Appendectomy    . Back surgery      Family History  Problem Relation Age of Onset  . Cancer Mother     kidney  . Alcohol abuse Father     No Known Allergies  Current Outpatient Prescriptions on File Prior to Visit  Medication Sig Dispense Refill  . amLODipine (NORVASC) 2.5 MG tablet Take 1 tablet (2.5 mg total) by mouth daily.  90 tablet  6  . aspirin 81 MG tablet Take 81 mg by mouth daily.        . Cholecalciferol (VITAMIN D-3 PO) Take 500 mg by mouth daily.        . cyclobenzaprine (FLEXERIL) 5 MG tablet Take 1 tablet (5 mg total) by mouth 3 (three) times daily as needed for muscle spasms.  30 tablet  0  . ibuprofen (ADVIL,MOTRIN) 600 MG tablet Take 1 tablet (600 mg total) by  mouth every 6 (six) hours as needed.  30 tablet  0  . Multiple Vitamin (MULTIVITAMINS PO) Take by mouth daily.         No current facility-administered medications on file prior to visit.    BP 122/76  Pulse 65  Temp(Src) 98.2 F (36.8 C) (Oral)  Resp 20  Ht 5\' 3"  (1.6 m)  Wt 154 lb (69.854 kg)  BMI 27.29 kg/m2  SpO2 98%      Review of Systems  Constitutional: Negative.   HENT: Negative for congestion, dental problem, hearing loss, rhinorrhea, sinus pressure, sore throat and tinnitus.   Eyes: Negative for pain, discharge and visual disturbance.  Respiratory: Negative for cough and shortness of breath.   Cardiovascular: Negative for chest pain, palpitations and leg swelling.  Gastrointestinal: Negative for nausea, vomiting, abdominal pain, diarrhea, constipation, blood in stool and abdominal distention.  Genitourinary: Negative for dysuria, urgency, frequency, hematuria, flank pain, vaginal bleeding, vaginal discharge, difficulty urinating, vaginal pain and pelvic pain.  Musculoskeletal: Negative for arthralgias, gait problem and joint swelling.  Skin: Positive for rash.  Neurological: Negative  for dizziness, syncope, speech difficulty, weakness, numbness and headaches.  Hematological: Negative for adenopathy.  Psychiatric/Behavioral: Negative for behavioral problems, dysphoric mood and agitation. The patient is not nervous/anxious.        Objective:   Physical Exam  Constitutional: She is oriented to person, place, and time. She appears well-developed and well-nourished.  Blood pressure well controlled  HENT:  Head: Normocephalic.  Right Ear: External ear normal.  Left Ear: External ear normal.  Mouth/Throat: Oropharynx is clear and moist.  Eyes: Conjunctivae and EOM are normal. Pupils are equal, round, and reactive to light.  Neck: Normal range of motion. Neck supple. No thyromegaly present.  Cardiovascular: Normal rate, regular rhythm, normal heart sounds and intact  distal pulses.   Pulmonary/Chest: Effort normal and breath sounds normal.  Abdominal: Soft. Bowel sounds are normal. She exhibits no mass. There is no tenderness.  Musculoskeletal: Normal range of motion.  Lymphadenopathy:    She has no cervical adenopathy.  Neurological: She is alert and oriented to person, place, and time.  Skin: Skin is warm and dry. No rash noted.  Some dry, flaky skin noted involving the left lateral nose.  This appeared to be over an area of mild chronic scarring  Psychiatric: She has a normal mood and affect. Her behavior is normal.          Assessment & Plan:    Hypertension well controlled.  Continue present regimen.  CPX 6 months Lesion left lateral nose.  We'll treat with a 1% hydrocortisone for 10 days twice daily and observe.  Patient was made aware that chronic steroid use would not be appropriate.  If rash fails to improve or if rash, recurs, we'll set up for dermatology referral

## 2013-07-23 ENCOUNTER — Telehealth: Payer: Self-pay | Admitting: Internal Medicine

## 2013-07-23 NOTE — Telephone Encounter (Signed)
Relevant patient education assigned to patient using Emmi. ° °

## 2013-10-23 ENCOUNTER — Ambulatory Visit (INDEPENDENT_AMBULATORY_CARE_PROVIDER_SITE_OTHER): Payer: Commercial Managed Care - HMO | Admitting: *Deleted

## 2013-10-23 DIAGNOSIS — Z23 Encounter for immunization: Secondary | ICD-10-CM

## 2013-10-24 ENCOUNTER — Ambulatory Visit: Payer: Commercial Managed Care - HMO | Admitting: *Deleted

## 2013-10-30 ENCOUNTER — Telehealth: Payer: Self-pay | Admitting: Internal Medicine

## 2013-10-30 DIAGNOSIS — E785 Hyperlipidemia, unspecified: Secondary | ICD-10-CM

## 2013-10-30 NOTE — Telephone Encounter (Signed)
Pt has hardening of a valve and went to dr Acie Fredrickson one time.  Needs to go back, bt wants to go to [facility]: Natalie Fitzgerald  pt needs referral to go. Pt has appt 10/27.

## 2013-11-04 ENCOUNTER — Ambulatory Visit (INDEPENDENT_AMBULATORY_CARE_PROVIDER_SITE_OTHER): Payer: Commercial Managed Care - HMO | Admitting: Internal Medicine

## 2013-11-04 ENCOUNTER — Encounter: Payer: Self-pay | Admitting: Internal Medicine

## 2013-11-04 VITALS — BP 120/80 | HR 65 | Temp 98.2°F | Resp 20 | Ht 63.0 in | Wt 155.0 lb

## 2013-11-04 DIAGNOSIS — L219 Seborrheic dermatitis, unspecified: Secondary | ICD-10-CM

## 2013-11-04 MED ORDER — KETOCONAZOLE 2 % EX CREA: 1.0000 "application " | TOPICAL_CREAM | Freq: Every day | CUTANEOUS | Status: DC

## 2013-11-04 MED ORDER — KETOCONAZOLE 1 % EX SHAM: MEDICATED_SHAMPOO | CUTANEOUS | Status: DC

## 2013-11-04 MED ORDER — HYDROCORTISONE 1 % EX LOTN: 1.0000 "application " | TOPICAL_LOTION | Freq: Two times a day (BID) | CUTANEOUS | Status: DC

## 2013-11-04 NOTE — Progress Notes (Signed)
   Subjective:    Patient ID: Natalie Fitzgerald, female    DOB: 1927-05-13, 80 y.o.   MRN: [id number]  HPI  80 year old patient who presents with a six-month history of a bothersome facial rash.  She basically describes small papules and scaling primarily over the mental region  Past Medical History  Diagnosis Date  . Nipple problem   . Aortic valve disorders     hardening of valve`  . Atypical mole     nose  . Glaucoma   . Dizziness     History   Social History  . Marital Status: Married    Spouse Name: N/A    Number of Children: N/A  . Years of Education: N/A   Occupational History  . Not on file.   Social History Main Topics  . Smoking status: Never Smoker   . Smokeless tobacco: Not on file  . Alcohol Use: No  . Drug Use: No  . Sexual Activity:    Other Topics Concern  . Not on file   Social History Narrative  . No narrative on file    Past Surgical History  Procedure Laterality Date  . Appendectomy    . Back surgery      Family History  Problem Relation Age of Onset  . Cancer Mother     kidney  . Alcohol abuse Father     No Known Allergies  Current Outpatient Prescriptions on File Prior to Visit  Medication Sig Dispense Refill  . amLODipine (NORVASC) 2.5 MG tablet Take 1 tablet (2.5 mg total) by mouth daily.  90 tablet  6  . aspirin 81 MG tablet Take 81 mg by mouth daily.        . Cholecalciferol (VITAMIN D-3 PO) Take 500 mg by mouth daily.        Marland Kitchen ibuprofen (ADVIL,MOTRIN) 600 MG tablet Take 1 tablet (600 mg total) by mouth every 6 (six) hours as needed.  30 tablet  0  . Multiple Vitamin (MULTIVITAMINS PO) Take by mouth daily.        . cyclobenzaprine (FLEXERIL) 5 MG tablet Take 1 tablet (5 mg total) by mouth 3 (three) times daily as needed for muscle spasms.  30 tablet  0   No current facility-administered medications on file prior to visit.    BP 120/80  Pulse 65  Temp(Src) 98.2 F (36.8 C) (Oral)  Resp 20  Ht 5\' 3"  (1.6 m)  Wt 155 lb  (70.308 kg)  BMI 27.46 kg/m2  SpO2 98%     Review of Systems  Skin: Positive for rash.       Objective:   Physical Exam  Constitutional: She appears well-developed and well-nourished. No distress.  Skin:  Scattered areas of erythema, dry, flaky skin in tiny papules, most prominent over the left side chin region          Assessment & Plan:   Seborrhea dermatitis.  Will treat with short-term hydrocortisone and also with ketoconazole cream and weekly shampoo CPX as scheduled

## 2013-11-04 NOTE — Patient Instructions (Signed)
Seborrheic Dermatitis °Seborrheic dermatitis involves pink or red skin with greasy, flaky scales. This is often found on the scalp, eyebrows, nose, bearded area, and on or behind the ears. It can also occur on the central chest. It often occurs where there are more oil (sebaceous) glands. This condition is also known as dandruff. When this condition affects a baby's scalp, it is called cradle cap. It may come and go for no known reason. It can occur at any time of life from infancy to old age. °CAUSES  °The cause is unknown. It is not the result of too little moisture or too much oil. In some people, seborrheic dermatitis flare-ups seem to be triggered by stress. It also commonly occurs in people with certain diseases such as Parkinson's disease or HIV/AIDS. °SYMPTOMS  °· Thick scales on the scalp. °· Redness on the face or in the armpits. °· The skin may seem oily or dry, but moisturizers do not help. °· In infants, seborrheic dermatitis appears as scaly redness that does not seem to bother the baby. In some babies, it affects only the scalp. In others, it also affects the neck creases, armpits, groin, or behind the ears. °· In adults and adolescents, seborrheic dermatitis may affect only the scalp. It may look patchy or spread out, with areas of redness and flaking. Other areas commonly affected include: °¨ Eyebrows. °¨ Eyelids. °¨ Forehead. °¨ Skin behind the ears. °¨ Outer ears. °¨ Chest. °¨ Armpits. °¨ Nose creases. °¨ Skin creases under the breasts. °¨ Skin between the buttocks. °¨ Groin. °· Some adults and adolescents feel itching or burning in the affected areas. °DIAGNOSIS  °Your caregiver can usually tell what the problem is by doing a physical exam. °TREATMENT  °· Cortisone (steroid) ointments, creams, and lotions can help decrease inflammation. °· Babies can be treated with baby oil to soften the scales, then they may be washed with baby shampoo. If this does not help, a prescription topical steroid  medicine may work. °· Adults can use medicated shampoos. °· Your caregiver may prescribe corticosteroid cream and shampoo containing an antifungal or yeast medicine (ketoconazole). Hydrocortisone or anti-yeast cream can be rubbed directly onto seborrheic dermatitis patches. Yeast does not cause seborrheic dermatitis, but it seems to add to the problem. °In infants, seborrheic dermatitis is often worst during the first year of life. It tends to disappear on its own as the child grows. However, it may return during the teenage years. In adults and adolescents, seborrheic dermatitis tends to be a long-lasting condition that comes and goes over many years. °HOME CARE INSTRUCTIONS  °· Use prescribed medicines as directed. °· In infants, do not aggressively remove the scales or flakes on the scalp with a comb or by other means. This may lead to hair loss. °SEEK MEDICAL CARE IF:  °· The problem does not improve from the medicated shampoos, lotions, or other medicines given by your caregiver. °· You have any other questions or concerns. °Document Released: 12/26/2004 Document Revised: 06/27/2011 Document Reviewed: 05/17/2009 °[employer]® Patient Information ©2015 [employer]. This information is not intended to replace advice given to you by your health care provider. Make sure you discuss any questions you have with your health care provider. ° °

## 2014-01-14 ENCOUNTER — Ambulatory Visit (INDEPENDENT_AMBULATORY_CARE_PROVIDER_SITE_OTHER): Payer: Commercial Managed Care - HMO | Admitting: Cardiovascular Disease

## 2014-01-14 ENCOUNTER — Encounter: Payer: Self-pay | Admitting: Cardiovascular Disease

## 2014-01-14 VITALS — BP 120/76 | HR 82 | Ht 64.0 in | Wt 153.1 lb

## 2014-01-14 DIAGNOSIS — I10 Essential (primary) hypertension: Secondary | ICD-10-CM

## 2014-01-14 DIAGNOSIS — I351 Nonrheumatic aortic (valve) insufficiency: Secondary | ICD-10-CM | POA: Insufficient documentation

## 2014-01-14 NOTE — Assessment & Plan Note (Signed)
History of moderate aortic insufficiency by 2-D echocardiogram performed in 2010 by Dr. Cathie Olden . He apparently placed her on low-dose amlodipine for "afterload reduction". I do not hear diastolic murmur today. I will recheck a 2-D echocardiogram.

## 2014-01-14 NOTE — Assessment & Plan Note (Signed)
History of hypertension with blood pressure measurements at 120/76. She is on low-dose amlodipine 2.5 mg daily. Continue current meds at current dosing

## 2014-01-14 NOTE — Patient Instructions (Signed)
Echocardiogram. Echocardiography is a painless test that uses sound waves to create images of your heart. It provides your doctor with information about the size and shape of your heart and how well your heart's chambers and valves are working. This procedure takes approximately one hour. There are no restrictions for this procedure.  Your physician wants you to follow-up in 1 year with Dr. Gwenlyn Found. You will receive a reminder letter in the mail 2 months in advance. If you do not receive a letter, please call our office to schedule the follow-up appointment.

## 2014-01-14 NOTE — Progress Notes (Signed)
01/14/2014 Natalie Fitzgerald   [birth year]  [id number]  Primary Physician Nyoka Cowden, MD Primary Cardiologist: Lorretta Harp MD [name]   HPI:  Natalie Fitzgerald is a delightful 81 year old moderately overweight widowed Caucasian female mother of 4 children, grandmother of 5 grandchildren self-referred to our [place] office because of convenience and parking issues at the [employer]. Her prior cardiologist was Dr. Cathie Olden. She has a history of questionable hypertension as well as moderate aortic insufficiency documented by 2-D echocardiogram in 2010. She tells me that she was put on low-dose amlodipine because of her aortic insufficiency for "afterload reduction". She has never had a heart attack or stroke. She denies chest pain or shortness of breath.   Current Outpatient Prescriptions  Medication Sig Dispense Refill  . amLODipine (NORVASC) 2.5 MG tablet Take 1 tablet (2.5 mg total) by mouth daily. 90 tablet 6  . aspirin 81 MG tablet Take 81 mg by mouth daily.      . cyclobenzaprine (FLEXERIL) 5 MG tablet Take 1 tablet (5 mg total) by mouth 3 (three) times daily as needed for muscle spasms. 30 tablet 0  . hydrocortisone 1 % lotion Apply 1 application topically 2 (two) times daily. 118 mL 0  . ketoconazole (NIZORAL) 2 % cream Apply 1 application topically daily. 15 g 0  . KETOCONAZOLE, TOPICAL, 1 % SHAM Use as a shampoo and facial wash once weekly 125 mL 4   No current facility-administered medications for this visit.    No Known Allergies  History   Social History  . Marital Status: Married    Spouse Name: N/A    Number of Children: N/A  . Years of Education: N/A   Occupational History  . Not on file.   Social History Main Topics  . Smoking status: Never Smoker   . Smokeless tobacco: Not on file  . Alcohol Use: No  . Drug Use: No  . Sexual Activity: Not on file   Other Topics Concern  . Not on file   Social History Narrative     Review of Systems: General: negative for chills, fever, night sweats or weight changes.  Cardiovascular: negative for chest pain, dyspnea on exertion, edema, orthopnea, palpitations, paroxysmal nocturnal dyspnea or shortness of breath Dermatological: negative for rash Respiratory: negative for cough or wheezing Urologic: negative for hematuria Abdominal: negative for nausea, vomiting, diarrhea, bright red blood per rectum, melena, or hematemesis Neurologic: negative for visual changes, syncope, or dizziness All other systems reviewed and are otherwise negative except as noted above.    Blood pressure 120/76, pulse 82, height 5\' 4"  (1.626 m), weight 153 lb 1.6 oz (69.446 kg).  General appearance: alert and no distress Neck: no adenopathy, no carotid bruit, no JVD, supple, symmetrical, trachea midline and thyroid not enlarged, symmetric, no tenderness/mass/nodules Lungs: clear to auscultation bilaterally Heart: regular rate and rhythm, S1, S2 normal, no murmur, click, rub or gallop Extremities: extremities normal, atraumatic, no cyanosis or edema  EKG normal sinus rhythm at 82 with a ST or T-wave changes. I personally reviewed this EKG  ASSESSMENT AND PLAN:   Hypertension History of hypertension with blood pressure measurements at 120/76. She is on low-dose amlodipine 2.5 mg daily. Continue current meds at current dosing  Aortic insufficiency History of moderate aortic insufficiency by 2-D echocardiogram performed in 2010 by Dr. Cathie Olden . He apparently placed her on low-dose amlodipine for "afterload reduction". I do not hear diastolic murmur today. I will recheck a 2-D  echocardiogram.      Lorretta Harp MD [facility], [facility] 01/14/2014 2:06 PM

## 2014-01-15 DIAGNOSIS — H47233 Glaucomatous optic atrophy, bilateral: Secondary | ICD-10-CM | POA: Diagnosis not present

## 2014-01-15 DIAGNOSIS — H402234 Chronic angle-closure glaucoma, bilateral, indeterminate stage: Secondary | ICD-10-CM | POA: Diagnosis not present

## 2014-01-21 ENCOUNTER — Ambulatory Visit (HOSPITAL_COMMUNITY)
Admission: RE | Admit: 2014-01-21 | Discharge: 2014-01-21 | Disposition: A | Discharge status: Home / Self Care | Payer: Medicare HMO | Source: Ambulatory Visit | Attending: Internal Medicine | Admitting: Internal Medicine

## 2014-01-21 DIAGNOSIS — I359 Nonrheumatic aortic valve disorder, unspecified: Secondary | ICD-10-CM

## 2014-01-21 DIAGNOSIS — I351 Nonrheumatic aortic (valve) insufficiency: Secondary | ICD-10-CM

## 2014-01-21 DIAGNOSIS — I10 Essential (primary) hypertension: Secondary | ICD-10-CM | POA: Insufficient documentation

## 2014-01-21 NOTE — Progress Notes (Signed)
2D Echo Performed 01/21/2014    Marygrace Drought, RCS

## 2014-01-22 ENCOUNTER — Encounter: Payer: Self-pay | Admitting: Internal Medicine

## 2014-01-22 ENCOUNTER — Ambulatory Visit (INDEPENDENT_AMBULATORY_CARE_PROVIDER_SITE_OTHER): Payer: Commercial Managed Care - HMO | Admitting: Internal Medicine

## 2014-01-22 DIAGNOSIS — E785 Hyperlipidemia, unspecified: Secondary | ICD-10-CM | POA: Diagnosis not present

## 2014-01-22 DIAGNOSIS — I10 Essential (primary) hypertension: Secondary | ICD-10-CM | POA: Diagnosis not present

## 2014-01-22 DIAGNOSIS — I351 Nonrheumatic aortic (valve) insufficiency: Secondary | ICD-10-CM | POA: Diagnosis not present

## 2014-01-22 DIAGNOSIS — Z23 Encounter for immunization: Secondary | ICD-10-CM

## 2014-01-22 DIAGNOSIS — Z0000 Encounter for general adult medical examination without abnormal findings: Secondary | ICD-10-CM

## 2014-01-22 LAB — CBC WITH DIFFERENTIAL/PLATELET
BASOS PCT: 0.5 % (ref 0.0–3.0)
Basophils Absolute: 0 10*3/uL (ref 0.0–0.1)
Eosinophils Absolute: 0.3 10*3/uL (ref 0.0–0.7)
Eosinophils Relative: 4.1 % (ref 0.0–5.0)
HEMATOCRIT: 37.4 % (ref 36.0–46.0)
HEMOGLOBIN: 12.2 g/dL (ref 12.0–15.0)
LYMPHS ABS: 2.3 10*3/uL (ref 0.7–4.0)
Lymphocytes Relative: 29.7 % (ref 12.0–46.0)
MCHC: 32.6 g/dL (ref 30.0–36.0)
MCV: 95.2 fl (ref 78.0–100.0)
MONO ABS: 0.6 10*3/uL (ref 0.1–1.0)
Monocytes Relative: 7.4 % (ref 3.0–12.0)
NEUTROS PCT: 58.3 % (ref 43.0–77.0)
Neutro Abs: 4.6 10*3/uL (ref 1.4–7.7)
Platelets: 212 10*3/uL (ref 150.0–400.0)
RBC: 3.93 Mil/uL (ref 3.87–5.11)
RDW: 14.3 % (ref 11.5–15.5)
WBC: 7.8 10*3/uL (ref 4.0–10.5)

## 2014-01-22 LAB — COMPREHENSIVE METABOLIC PANEL
ALK PHOS: 82 U/L (ref 39–117)
ALT: 11 U/L (ref 0–35)
AST: 17 U/L (ref 0–37)
Albumin: 3.8 g/dL (ref 3.5–5.2)
BUN: 31 mg/dL — ABNORMAL HIGH (ref 6–23)
CHLORIDE: 105 meq/L (ref 96–112)
CO2: 27 meq/L (ref 19–32)
Calcium: 9.6 mg/dL (ref 8.4–10.5)
Creatinine, Ser: 1.43 mg/dL — ABNORMAL HIGH (ref 0.40–1.20)
GFR: 37.15 mL/min — ABNORMAL LOW (ref >60.00)
Glucose, Bld: 96 mg/dL (ref 70–99)
POTASSIUM: 5 meq/L (ref 3.5–5.1)
Sodium: 137 mEq/L (ref 135–145)
Total Bilirubin: 0.4 mg/dL (ref 0.2–1.2)
Total Protein: 7.2 g/dL (ref 6.0–8.3)

## 2014-01-22 LAB — LIPID PANEL
CHOLESTEROL: 182 mg/dL (ref 0–200)
HDL: 47.2 mg/dL (ref >39.00)
LDL Cholesterol: 110 mg/dL — ABNORMAL HIGH (ref 0–99)
NONHDL: 134.8
Total CHOL/HDL Ratio: 4
Triglycerides: 126 mg/dL (ref 0.0–149.0)
VLDL: 25.2 mg/dL (ref 0.0–40.0)

## 2014-01-22 LAB — TSH: TSH: 3.46 u[IU]/mL (ref 0.35–4.50)

## 2014-01-22 MED ORDER — AMLODIPINE BESYLATE 2.5 MG PO TABS: 2.5000 mg | ORAL_TABLET | Freq: Every day | ORAL | Status: DC

## 2014-01-22 NOTE — Patient Instructions (Signed)
Limit your sodium (Salt) intake    It is important that you exercise regularly, at least 20 minutes 3 to 4 times per week.  If you develop chest pain or shortness of breath seek  medical attention.  Take a calcium supplement, plus 800-1200 units of vitamin D  Return in one year for follow-up  Health Maintenance Adopting a healthy lifestyle and getting preventive care can go a long way to promote health and wellness. Talk with your health care provider about what schedule of regular examinations is right for you. This is a good chance for you to check in with your provider about disease prevention and staying healthy. In between checkups, there are plenty of things you can do on your own. Experts have done a lot of research about which lifestyle changes and preventive measures are most likely to keep you healthy. Ask your health care provider for more information. WEIGHT AND DIET  Eat a healthy diet  Be sure to include plenty of vegetables, fruits, low-fat dairy products, and lean protein.  Do not eat a lot of foods high in solid fats, added sugars, or salt.  Get regular exercise. This is one of the most important things you can do for your health.  Most adults should exercise for at least 150 minutes each week. The exercise should increase your heart rate and make you sweat (moderate-intensity exercise).  Most adults should also do strengthening exercises at least twice a week. This is in addition to the moderate-intensity exercise.  Maintain a healthy weight  Body mass index (BMI) is a measurement that can be used to identify possible weight problems. It estimates body fat based on height and weight. Your health care provider can help determine your BMI and help you achieve or maintain a healthy weight.  For females 20 years of age and older:   A BMI below 18.5 is considered underweight.  A BMI of 18.5 to 24.9 is normal.  A BMI of 25 to 29.9 is considered overweight.  A BMI of  30 and above is considered obese.  Watch levels of cholesterol and blood lipids  You should start having your blood tested for lipids and cholesterol at 79 years of age, then have this test every 5 years.  You may need to have your cholesterol levels checked more often if:  Your lipid or cholesterol levels are high.  You are older than 79 years of age.  You are at high risk for heart disease.  CANCER SCREENING   Lung Cancer  Lung cancer screening is recommended for adults 55-80 years old who are at high risk for lung cancer because of a history of smoking.  A yearly low-dose CT scan of the lungs is recommended for people who:  Currently smoke.  Have quit within the past 15 years.  Have at least a 30-pack-year history of smoking. A pack year is smoking an average of one pack of cigarettes a day for 1 year.  Yearly screening should continue until it has been 15 years since you quit.  Yearly screening should stop if you develop a health problem that would prevent you from having lung cancer treatment.  Breast Cancer  Practice breast self-awareness. This means understanding how your breasts normally appear and feel.  It also means doing regular breast self-exams. Let your health care provider know about any changes, no matter how small.  If you are in your 20s or 30s, you should have a clinical breast exam (CBE) by   a health care provider every 1-3 years as part of a regular health exam.  If you are 40 or older, have a CBE every year. Also consider having a breast X-ray (mammogram) every year.  If you have a family history of breast cancer, talk to your health care provider about genetic screening.  If you are at high risk for breast cancer, talk to your health care provider about having an MRI and a mammogram every year.  Breast cancer gene (BRCA) assessment is recommended for women who have family members with BRCA-related cancers. BRCA-related cancers  include:  Breast.  Ovarian.  Tubal.  Peritoneal cancers.  Results of the assessment will determine the need for genetic counseling and BRCA1 and BRCA2 testing. Cervical Cancer Routine pelvic examinations to screen for cervical cancer are no longer recommended for nonpregnant women who are considered low risk for cancer of the pelvic organs (ovaries, uterus, and vagina) and who do not have symptoms. A pelvic examination may be necessary if you have symptoms including those associated with pelvic infections. Ask your health care provider if a screening pelvic exam is right for you.   The Pap test is the screening test for cervical cancer for women who are considered at risk.  If you had a hysterectomy for a problem that was not cancer or a condition that could lead to cancer, then you no longer need Pap tests.  If you are older than 65 years, and you have had normal Pap tests for the past 10 years, you no longer need to have Pap tests.  If you have had past treatment for cervical cancer or a condition that could lead to cancer, you need Pap tests and screening for cancer for at least 20 years after your treatment.  If you no longer get a Pap test, assess your risk factors if they change (such as having a new sexual partner). This can affect whether you should start being screened again.  Some women have medical problems that increase their chance of getting cervical cancer. If this is the case for you, your health care provider may recommend more frequent screening and Pap tests.  The human papillomavirus (HPV) test is another test that may be used for cervical cancer screening. The HPV test looks for the virus that can cause cell changes in the cervix. The cells collected during the Pap test can be tested for HPV.  The HPV test can be used to screen women 30 years of age and older. Getting tested for HPV can extend the interval between normal Pap tests from three to five years.  An HPV  test also should be used to screen women of any age who have unclear Pap test results.  After 79 years of age, women should have HPV testing as often as Pap tests.  Colorectal Cancer  This type of cancer can be detected and often prevented.  Routine colorectal cancer screening usually begins at 79 years of age and continues through 79 years of age.  Your health care provider may recommend screening at an earlier age if you have risk factors for colon cancer.  Your health care provider may also recommend using home test kits to check for hidden blood in the stool.  A small camera at the end of a tube can be used to examine your colon directly (sigmoidoscopy or colonoscopy). This is done to check for the earliest forms of colorectal cancer.  Routine screening usually begins at age 50.  Direct examination   of the colon should be repeated every 5-10 years through 79 years of age. However, you may need to be screened more often if early forms of precancerous polyps or small growths are found. Skin Cancer  Check your skin from head to toe regularly.  Tell your health care provider about any new moles or changes in moles, especially if there is a change in a mole's shape or color.  Also tell your health care provider if you have a mole that is larger than the size of a pencil eraser.  Always use sunscreen. Apply sunscreen liberally and repeatedly throughout the day.  Protect yourself by wearing long sleeves, pants, a wide-brimmed hat, and sunglasses whenever you are outside. HEART DISEASE, DIABETES, AND HIGH BLOOD PRESSURE   Have your blood pressure checked at least every 1-2 years. High blood pressure causes heart disease and increases the risk of stroke.  If you are between 51 years and 9 years old, ask your health care provider if you should take aspirin to prevent strokes.  Have regular diabetes screenings. This involves taking a blood sample to check your fasting blood sugar  level.  If you are at a normal weight and have a low risk for diabetes, have this test once every three years after 79 years of age.  If you are overweight and have a high risk for diabetes, consider being tested at a younger age or more often. PREVENTING INFECTION  Hepatitis B  If you have a higher risk for hepatitis B, you should be screened for this virus. You are considered at high risk for hepatitis B if:  You were born in a country where hepatitis B is common. Ask your health care provider which countries are considered high risk.  Your parents were born in a high-risk country, and you have not been immunized against hepatitis B (hepatitis B vaccine).  You have HIV or AIDS.  You use needles to inject street drugs.  You live with someone who has hepatitis B.  You have had sex with someone who has hepatitis B.  You get hemodialysis treatment.  You take certain medicines for conditions, including cancer, organ transplantation, and autoimmune conditions. Hepatitis C  Blood testing is recommended for:  Everyone born from [birth year] through 1963.  Anyone with known risk factors for hepatitis C. Sexually transmitted infections (STIs)  You should be screened for sexually transmitted infections (STIs) including gonorrhea and chlamydia if:  You are sexually active and are younger than 79 years of age.  You are older than 79 years of age and your health care provider tells you that you are at risk for this type of infection.  Your sexual activity has changed since you were last screened and you are at an increased risk for chlamydia or gonorrhea. Ask your health care provider if you are at risk.  If you do not have HIV, but are at risk, it may be recommended that you take a prescription medicine daily to prevent HIV infection. This is called pre-exposure prophylaxis (PrEP). You are considered at risk if:  You are sexually active and do not regularly use condoms or know the HIV status  of your partner(s).  You take drugs by injection.  You are sexually active with a partner who has HIV. Talk with your health care provider about whether you are at high risk of being infected with HIV. If you choose to begin PrEP, you should first be tested for HIV. You should then be tested every  3 months for as long as you are taking PrEP.  PREGNANCY   If you are premenopausal and you may become pregnant, ask your health care provider about preconception counseling.  If you may become pregnant, take 400 to 800 micrograms (mcg) of folic acid every day.  If you want to prevent pregnancy, talk to your health care provider about birth control (contraception). OSTEOPOROSIS AND MENOPAUSE   Osteoporosis is a disease in which the bones lose minerals and strength with aging. This can result in serious bone fractures. Your risk for osteoporosis can be identified using a bone density scan.  If you are 65 years of age or older, or if you are at risk for osteoporosis and fractures, ask your health care provider if you should be screened.  Ask your health care provider whether you should take a calcium or vitamin D supplement to lower your risk for osteoporosis.  Menopause may have certain physical symptoms and risks.  Hormone replacement therapy may reduce some of these symptoms and risks. Talk to your health care provider about whether hormone replacement therapy is right for you.  HOME CARE INSTRUCTIONS   Schedule regular health, dental, and eye exams.  Stay current with your immunizations.   Do not use any tobacco products including cigarettes, chewing tobacco, or electronic cigarettes.  If you are pregnant, do not drink alcohol.  If you are breastfeeding, limit how much and how often you drink alcohol.  Limit alcohol intake to no more than 1 drink per day for nonpregnant women. One drink equals 12 ounces of beer, 5 ounces of wine, or 1 ounces of hard liquor.  Do not use street  drugs.  Do not share needles.  Ask your health care provider for help if you need support or information about quitting drugs.  Tell your health care provider if you often feel depressed.  Tell your health care provider if you have ever been abused or do not feel safe at home. Document Released: 07/11/2010 Document Revised: 05/12/2013 Document Reviewed: 11/27/2012 Exit[employer] Patient Information 2015 [employer]. This information is not intended to replace advice given to you by your health care provider. Make sure you discuss any questions you have with your health care provider.  

## 2014-01-22 NOTE — Progress Notes (Signed)
Pre visit review using our clinic review tool, if applicable. No additional management support is needed unless otherwise documented below in the visit note. 

## 2014-01-22 NOTE — Progress Notes (Signed)
Subjective:    Patient ID: Natalie Fitzgerald, female    DOB: November 27, 1927, 81 y.o.   MRN: [id number]  HPI   81 year old patient who is seen today for a wellness exam.  She enjoys excellent health.  She has treated hypertension.  She is followed by cardiology due to mild aortic insufficiency.  She did have a 2-D echocardiogram yesterday that was reviewed.  No concerns or complaints  Current Allergies: No known allergies   Past Medical History:  Reviewed history from 01/24/2007 and no changes required:  Allergic rhinitis  Hyperlipidemia  Positive PPD  Low back pain  Osteopenia   Family History:  Reviewed history from 01/24/2007 and no changes required:  father died age 45. Gunshot wound history of EtOH  mother died in her 6s. Renal cancer    One brother history of hypertension and diabetes; history of CVA  Past Medical History  Diagnosis Date  . Nipple problem   . Aortic valve disorders     moderate aortic insufficiency  . Atypical mole     nose  . Glaucoma   . Dizziness     History   Social History  . Marital Status: Married    Spouse Name: N/A    Number of Children: N/A  . Years of Education: N/A   Occupational History  . Not on file.   Social History Main Topics  . Smoking status: Never Smoker   . Smokeless tobacco: Not on file  . Alcohol Use: No  . Drug Use: No  . Sexual Activity: Not on file   Other Topics Concern  . Not on file   Social History Narrative    Past Surgical History  Procedure Laterality Date  . Appendectomy    . Back surgery      Family History  Problem Relation Age of Onset  . Cancer Mother     kidney  . Alcohol abuse Father     No Known Allergies  Current Outpatient Prescriptions on File Prior to Visit  Medication Sig Dispense Refill  . amLODipine (NORVASC) 2.5 MG tablet Take 1 tablet (2.5 mg total) by mouth daily. 90 tablet 6  . aspirin 81 MG tablet Take 81 mg  by mouth daily.      . cyclobenzaprine (FLEXERIL) 5 MG tablet Take 1 tablet (5 mg total) by mouth 3 (three) times daily as needed for muscle spasms. 30 tablet 0  . hydrocortisone 1 % lotion Apply 1 application topically 2 (two) times daily. 118 mL 0  . ketoconazole (NIZORAL) 2 % cream Apply 1 application topically daily. 15 g 0  . KETOCONAZOLE, TOPICAL, 1 % SHAM Use as a shampoo and facial wash once weekly 125 mL 4   No current facility-administered medications on file prior to visit.    BP 138/80 mmHg  Pulse 73  Temp(Src) 98.2 F (36.8 C) (Oral)  Resp 20  Ht 5' 2.5" (1.588 m)  Wt 152 lb (68.947 kg)  BMI 27.34 kg/m2  SpO2 98%   1. Risk factors, based on past  M,S,F history.  Cardiovascular risk factors include hypertension, a mild dyslipidemia.  Patient has a history of mild aortic insufficiency.  2.  Physical activities: No restrictions.  Does walk 20 minutes 3 times weekly  3.  Depression/mood: No history of depression or mood disorder  4.  Hearing: No deficits  5.  ADL's: Independent in all aspects of daily living  6.  Fall risk: Low  7.  Home safety: No problems  identified  8.  Height weight, and visual acuity; height and weight stable no change in visual acuity.  Has had bilateral cataract extraction surgery.  Is followed by ophthalmology for glaucoma which has been stable  72.  Counseling: Continue regular exercise and heart healthy diet  10. Lab orders based on risk factors: Laboratory update, including lipid profile will be reviewed  11. Referral : Follow-up cardiology  12. Care plan: Continue heart healthy diet and active lifestyle  13. Cognitive assessment: Alert and oriented with normal affect.  No cognitive dysfunction  14. Screening: Continue annual primary care evaluation with screening lab  15. Provider List Update: Includes cardiology and primary care      Review of Systems  Constitutional: Negative for fever, appetite change, fatigue and  unexpected weight change.  HENT: Negative for congestion, dental problem, ear pain, hearing loss, mouth sores, nosebleeds, sinus pressure, sore throat, tinnitus, trouble swallowing and voice change.   Eyes: Negative for photophobia, pain, redness and visual disturbance.  Respiratory: Negative for cough, chest tightness and shortness of breath.   Cardiovascular: Negative for chest pain, palpitations and leg swelling.  Gastrointestinal: Negative for nausea, vomiting, abdominal pain, diarrhea, constipation, blood in stool, abdominal distention and rectal pain.  Genitourinary: Negative for dysuria, urgency, frequency, hematuria, flank pain, vaginal bleeding, vaginal discharge, difficulty urinating, genital sores, vaginal pain, menstrual problem and pelvic pain.  Musculoskeletal: Negative for back pain, arthralgias and neck stiffness.  Skin: Negative for rash.  Neurological: Negative for dizziness, syncope, speech difficulty, weakness, light-headedness, numbness and headaches.  Hematological: Negative for adenopathy. Does not bruise/bleed easily.  Psychiatric/Behavioral: Negative for suicidal ideas, behavioral problems, self-injury, dysphoric mood and agitation. The patient is not nervous/anxious.        Objective:   Physical Exam  Constitutional: She is oriented to person, place, and time. She appears well-developed and well-nourished.  HENT:  Head: Normocephalic and atraumatic.  Right Ear: External ear normal.  Left Ear: External ear normal.  Mouth/Throat: Oropharynx is clear and moist.  Eyes: Conjunctivae and EOM are normal.  Anisocoria  with irregular pupils  Neck: Normal range of motion. Neck supple. No JVD present. No thyromegaly present.  Cardiovascular: Normal rate, regular rhythm and normal heart sounds.   No murmur heard. Pedal pulses full except for an absent right dorsalis pedis pulse  No murmur of AI noted with the patient supine  Pulmonary/Chest: Effort normal and breath  sounds normal. She has no wheezes. She has no rales.  Abdominal: Soft. Bowel sounds are normal. She exhibits no distension and no mass. There is no tenderness. There is no rebound and no guarding.  Musculoskeletal: Normal range of motion. She exhibits no edema or tenderness.  Neurological: She is alert and oriented to person, place, and time. She has normal reflexes. No cranial nerve deficit. She exhibits normal muscle tone. Coordination normal.  Skin: Skin is warm and dry. No rash noted.  Psychiatric: She has a normal mood and affect. Her behavior is normal.          Assessment & Plan:   Preventive health care History of aortic insufficiency, stable Hypertension, controlled Osteopenia.  Continue calcium and vitamin D supplements Dyslipidemia.  Will review a lipid profile  Cardiology follow-up Recheck one year

## 2014-01-23 ENCOUNTER — Telehealth: Payer: Self-pay | Admitting: Cardiovascular Disease

## 2014-01-23 NOTE — Telephone Encounter (Signed)
Spoke with patient and notified of echo results

## 2014-01-23 NOTE — Telephone Encounter (Signed)
Returning your call from yesterday about her echo results.

## 2014-07-16 DIAGNOSIS — H43812 Vitreous degeneration, left eye: Secondary | ICD-10-CM | POA: Diagnosis not present

## 2014-07-16 DIAGNOSIS — H402290 Chronic angle-closure glaucoma, unspecified eye, stage unspecified: Secondary | ICD-10-CM | POA: Diagnosis not present

## 2015-01-15 ENCOUNTER — Encounter: Payer: Self-pay | Admitting: Cardiovascular Disease

## 2015-01-15 ENCOUNTER — Ambulatory Visit (INDEPENDENT_AMBULATORY_CARE_PROVIDER_SITE_OTHER): Payer: Commercial Managed Care - HMO | Admitting: Cardiovascular Disease

## 2015-01-15 VITALS — BP 116/52 | HR 79 | Ht 64.0 in | Wt 154.3 lb

## 2015-01-15 DIAGNOSIS — I10 Essential (primary) hypertension: Secondary | ICD-10-CM

## 2015-01-15 DIAGNOSIS — I351 Nonrheumatic aortic (valve) insufficiency: Secondary | ICD-10-CM

## 2015-01-15 DIAGNOSIS — E785 Hyperlipidemia, unspecified: Secondary | ICD-10-CM

## 2015-01-15 NOTE — Patient Instructions (Signed)

## 2015-01-15 NOTE — Assessment & Plan Note (Signed)
History of mild aortic insufficiency by 2-D echo most recently checked 01/21/14 with normal LV size and function.

## 2015-01-15 NOTE — Progress Notes (Signed)
     01/15/2015 Natalie Fitzgerald   04/20/1927  [id number]  Primary Physician Nyoka Cowden, MD Primary Cardiologist: Lorretta Harp MD [name]   HPI:  Natalie Fitzgerald is a delightful 82 year old moderately overweight widowed Caucasian female mother of 4 children, grandmother of 5 grandchildren self-referred to our [place] office because of convenience and parking issues at the [employer]. Her prior cardiologist was Dr. Cathie Olden. Last saw her in the office 01/14/14.She has a history of questionable hypertension as well as mild aortic insufficiency by 2-D echo as recently as 01/21/14 with normal LV size and function. She tells me that she was put on low-dose amlodipine because of her aortic insufficiency for "afterload reduction". She has never had a heart attack or stroke. She denies chest pain or shortness of breath.   Current Outpatient Prescriptions  Medication Sig Dispense Refill  . amLODipine (NORVASC) 2.5 MG tablet Take 1 tablet (2.5 mg total) by mouth daily. 90 tablet 3  . aspirin 81 MG tablet Take 81 mg by mouth daily.      . hydrocortisone 1 % lotion Apply 1 application topically 2 (two) times daily. 118 mL 0   No current facility-administered medications for this visit.    No Known Allergies  Social History   Social History  . Marital Status: Married    Spouse Name: N/A  . Number of Children: N/A  . Years of Education: N/A   Occupational History  . Not on file.   Social History Main Topics  . Smoking status: Never Smoker   . Smokeless tobacco: Not on file  . Alcohol Use: No  . Drug Use: No  . Sexual Activity: Not on file   Other Topics Concern  . Not on file   Social History Narrative     Review of Systems: General: negative for chills, fever, night sweats or weight changes.  Cardiovascular: negative for chest pain, dyspnea on exertion, edema, orthopnea, palpitations, paroxysmal nocturnal dyspnea or shortness of  breath Dermatological: negative for rash Respiratory: negative for cough or wheezing Urologic: negative for hematuria Abdominal: negative for nausea, vomiting, diarrhea, bright red blood per rectum, melena, or hematemesis Neurologic: negative for visual changes, syncope, or dizziness All other systems reviewed and are otherwise negative except as noted above.    Blood pressure 116/52, pulse 79, height 5\' 4"  (1.626 m), weight 154 lb 4.8 oz (69.99 kg).  General appearance: alert and no distress Neck: no adenopathy, no carotid bruit, no JVD, supple, symmetrical, trachea midline and thyroid not enlarged, symmetric, no tenderness/mass/nodules Lungs: clear to auscultation bilaterally Heart: regular rate and rhythm, S1, S2 normal, no murmur, click, rub or gallop  EKG normal sinus rhythm at 79 without ST or T-wave changes. I personally reviewed this EKG  ASSESSMENT AND PLAN:   Hypertension History of hypertension blood pressure measures 116/92. She is on amlodipine. Continue current meds at current dosing  Hyperlipemia History of hyperlipidemia not on statin therapy with recent lipid profile performed one year ago revealing total cholesterol 82, LDL of 110 with HDL 47.  Aortic insufficiency History of mild aortic insufficiency by 2-D echo most recently checked 01/21/14 with normal LV size and function.      Lorretta Harp MD FACP,FACC,FAHA, [facility] 01/15/2015 2:54 PM

## 2015-01-15 NOTE — Assessment & Plan Note (Signed)
History of hyperlipidemia not on statin therapy with recent lipid profile performed one year ago revealing total cholesterol 82, LDL of 110 with HDL 47.

## 2015-01-15 NOTE — Assessment & Plan Note (Signed)
History of hypertension blood pressure measures 116/92. She is on amlodipine. Continue current meds at current dosing

## 2015-01-21 DIAGNOSIS — H43813 Vitreous degeneration, bilateral: Secondary | ICD-10-CM | POA: Diagnosis not present

## 2015-01-21 DIAGNOSIS — H402232 Chronic angle-closure glaucoma, bilateral, moderate stage: Secondary | ICD-10-CM | POA: Diagnosis not present

## 2015-01-21 DIAGNOSIS — H47233 Glaucomatous optic atrophy, bilateral: Secondary | ICD-10-CM | POA: Diagnosis not present

## 2015-01-26 ENCOUNTER — Ambulatory Visit (INDEPENDENT_AMBULATORY_CARE_PROVIDER_SITE_OTHER): Payer: Commercial Managed Care - HMO | Admitting: Internal Medicine

## 2015-01-26 ENCOUNTER — Encounter: Payer: Self-pay | Admitting: Internal Medicine

## 2015-01-26 VITALS — BP 150/76 | HR 69 | Temp 98.2°F | Resp 20 | Ht 62.5 in | Wt 156.0 lb

## 2015-01-26 DIAGNOSIS — Z0000 Encounter for general adult medical examination without abnormal findings: Secondary | ICD-10-CM

## 2015-01-26 DIAGNOSIS — E785 Hyperlipidemia, unspecified: Secondary | ICD-10-CM

## 2015-01-26 DIAGNOSIS — Z23 Encounter for immunization: Secondary | ICD-10-CM | POA: Diagnosis not present

## 2015-01-26 DIAGNOSIS — I351 Nonrheumatic aortic (valve) insufficiency: Secondary | ICD-10-CM | POA: Diagnosis not present

## 2015-01-26 DIAGNOSIS — I10 Essential (primary) hypertension: Secondary | ICD-10-CM | POA: Diagnosis not present

## 2015-01-26 DIAGNOSIS — J309 Allergic rhinitis, unspecified: Secondary | ICD-10-CM | POA: Diagnosis not present

## 2015-01-26 LAB — COMPREHENSIVE METABOLIC PANEL
ALT: 10 U/L (ref 0–35)
AST: 15 U/L (ref 0–37)
Albumin: 3.8 g/dL (ref 3.5–5.2)
Alkaline Phosphatase: 79 U/L (ref 39–117)
BUN: 21 mg/dL (ref 6–23)
CO2: 30 mEq/L (ref 19–32)
Calcium: 9.2 mg/dL (ref 8.4–10.5)
Chloride: 105 mEq/L (ref 96–112)
Creatinine, Ser: 1.28 mg/dL — ABNORMAL HIGH (ref 0.40–1.20)
GFR: 42.12 mL/min — ABNORMAL LOW (ref >60.00)
GLUCOSE: 101 mg/dL — AB (ref 70–99)
POTASSIUM: 5 meq/L (ref 3.5–5.1)
SODIUM: 139 meq/L (ref 135–145)
Total Bilirubin: 0.4 mg/dL (ref 0.2–1.2)
Total Protein: 6.9 g/dL (ref 6.0–8.3)

## 2015-01-26 LAB — CBC WITH DIFFERENTIAL/PLATELET
Basophils Absolute: 0 10*3/uL (ref 0.0–0.1)
Basophils Relative: 0.5 % (ref 0.0–3.0)
EOS PCT: 3.7 % (ref 0.0–5.0)
Eosinophils Absolute: 0.3 10*3/uL (ref 0.0–0.7)
HEMATOCRIT: 36.2 % (ref 36.0–46.0)
Hemoglobin: 11.9 g/dL — ABNORMAL LOW (ref 12.0–15.0)
LYMPHS ABS: 1.9 10*3/uL (ref 0.7–4.0)
Lymphocytes Relative: 21.4 % (ref 12.0–46.0)
MCHC: 32.9 g/dL (ref 30.0–36.0)
MCV: 94.8 fl (ref 78.0–100.0)
MONOS PCT: 6.7 % (ref 3.0–12.0)
Monocytes Absolute: 0.6 10*3/uL (ref 0.1–1.0)
Neutro Abs: 6.1 10*3/uL (ref 1.4–7.7)
Neutrophils Relative %: 67.7 % (ref 43.0–77.0)
Platelets: 242 10*3/uL (ref 150.0–400.0)
RBC: 3.81 Mil/uL — AB (ref 3.87–5.11)
RDW: 14.2 % (ref 11.5–15.5)
WBC: 9.1 10*3/uL (ref 4.0–10.5)

## 2015-01-26 LAB — TSH: TSH: 4.08 u[IU]/mL (ref 0.35–4.50)

## 2015-01-26 MED ORDER — AMLODIPINE BESYLATE 2.5 MG PO TABS: 2.5000 mg | ORAL_TABLET | Freq: Every day | ORAL | Status: DC

## 2015-01-26 NOTE — Progress Notes (Signed)
Pre visit review using our clinic review tool, if applicable. No additional management support is needed unless otherwise documented below in the visit note. 

## 2015-01-26 NOTE — Patient Instructions (Signed)
Limit your sodium (Salt) intake  Please check your blood pressure on a regular basis.  If it is consistently greater than 150/90, please make an office appointment.    It is important that you exercise regularly, at least 20 minutes 3 to 4 times per week.  If you develop chest pain or shortness of breath seek  medical attention.  Return in one year for follow-up  

## 2015-01-26 NOTE — Progress Notes (Signed)
Subjective:    Patient ID: Natalie Fitzgerald, female    DOB: 13-Jun-1927, 82 y.o.   MRN: [id number]  HPI    82 year-old patient who is seen today for a wellness exam.    She enjoys excellent health.  She has treated hypertension.  She is followed by cardiology due to mild aortic insufficiency.  She did have a 2-D echocardiogram yesterday that was reviewed.  No concerns or complaints  Current Allergies: No known allergies   Past Medical History:   Allergic rhinitis  Hyperlipidemia  Positive PPD  Low back pain  Osteopenia   Family History:   father died age 61. Gunshot wound history of EtOH  mother died in her 84s. Renal cancer    One brother history of hypertension and diabetes; history of CVA  Past Medical History  Diagnosis Date  . Nipple problem   . Aortic valve disorders     moderate aortic insufficiency  . Atypical mole     nose  . Glaucoma   . Dizziness     Social History   Social History  . Marital Status: Married    Spouse Name: N/A  . Number of Children: N/A  . Years of Education: N/A   Occupational History  . Not on file.   Social History Main Topics  . Smoking status: Never Smoker   . Smokeless tobacco: Not on file  . Alcohol Use: No  . Drug Use: No  . Sexual Activity: Not on file   Other Topics Concern  . Not on file   Social History Narrative    Past Surgical History  Procedure Laterality Date  . Appendectomy    . Back surgery      Family History  Problem Relation Age of Onset  . Cancer Mother     kidney  . Alcohol abuse Father     No Known Allergies  Current Outpatient Prescriptions on File Prior to Visit  Medication Sig Dispense Refill  . aspirin 81 MG tablet Take 81 mg by mouth daily.      . hydrocortisone 1 % lotion Apply 1 application topically 2 (two) times daily. (Patient taking differently: Apply 1 application topically as needed. ) 118 mL 0   No current  facility-administered medications on file prior to visit.    BP 150/76 mmHg  Pulse 69  Temp(Src) 98.2 F (36.8 C) (Oral)  Resp 20  Ht 5' 2.5" (1.588 m)  Wt 156 lb (70.761 kg)  BMI 28.06 kg/m2  SpO2 98%   1. Risk factors, based on past  M,S,F history.  Cardiovascular risk factors include hypertension, a mild dyslipidemia.  Patient has a history of mild aortic insufficiency.  2.  Physical activities: No restrictions.  Does walk 20 minutes 3 times weekly  3.  Depression/mood: No history of depression or mood disorder  4.  Hearing: No deficits  5.  ADL's: Independent in all aspects of daily living  6.  Fall risk: Low  7.  Home safety: No problems identified  8.  Height weight, and visual acuity; height and weight stable no change in visual acuity.  Has had bilateral cataract extraction surgery.  Is followed by ophthalmology for glaucoma which has been stable  62.  Counseling: Continue regular exercise and heart healthy diet  10. Lab orders based on risk factors: Laboratory update, including lipid profile will be reviewed  11. Referral : Follow-up cardiology  12. Care plan: Continue heart healthy diet and active lifestyle  13.  Cognitive assessment: Alert and oriented with normal affect.  No cognitive dysfunction  14. Screening: Continue annual primary care evaluation with screening lab  15. Provider List Update: Includes cardiology and primary care      Review of Systems  Constitutional: Negative for fever, appetite change, fatigue and unexpected weight change.  HENT: Negative for congestion, dental problem, ear pain, hearing loss, mouth sores, nosebleeds, sinus pressure, sore throat, tinnitus, trouble swallowing and voice change.   Eyes: Negative for photophobia, pain, redness and visual disturbance.  Respiratory: Negative for cough, chest tightness and shortness of breath.   Cardiovascular: Negative for chest pain, palpitations and leg swelling.  Gastrointestinal:  Negative for nausea, vomiting, abdominal pain, diarrhea, constipation, blood in stool, abdominal distention and rectal pain.  Genitourinary: Negative for dysuria, urgency, frequency, hematuria, flank pain, vaginal bleeding, vaginal discharge, difficulty urinating, genital sores, vaginal pain, menstrual problem and pelvic pain.  Musculoskeletal: Negative for back pain, arthralgias and neck stiffness.  Skin: Negative for rash.  Neurological: Negative for dizziness, syncope, speech difficulty, weakness, light-headedness, numbness and headaches.  Hematological: Negative for adenopathy. Does not bruise/bleed easily.  Psychiatric/Behavioral: Negative for suicidal ideas, behavioral problems, self-injury, dysphoric mood and agitation. The patient is not nervous/anxious.        Objective:   Physical Exam  Constitutional: She is oriented to person, place, and time. She appears well-developed and well-nourished.  HENT:  Head: Normocephalic and atraumatic.  Right Ear: External ear normal.  Left Ear: External ear normal.  Mouth/Throat: Oropharynx is clear and moist.  Eyes: Conjunctivae and EOM are normal.  Anisocoria  with irregular pupils  Neck: Normal range of motion. Neck supple. No JVD present. No thyromegaly present.  Cardiovascular: Normal rate, regular rhythm and normal heart sounds.   No murmur heard. Pedal pulses full except for an absent right dorsalis pedis pulse  No murmur of AI noted with the patient supine  Pulmonary/Chest: Effort normal and breath sounds normal. She has no wheezes. She has no rales.  Abdominal: Soft. Bowel sounds are normal. She exhibits no distension and no mass. There is no tenderness. There is no rebound and no guarding.  Musculoskeletal: Normal range of motion. She exhibits no edema or tenderness.  Neurological: She is alert and oriented to person, place, and time. She has normal reflexes. No cranial nerve deficit. She exhibits normal muscle tone. Coordination  normal.  Skin: Skin is warm and dry. No rash noted.  Psychiatric: She has a normal mood and affect. Her behavior is normal.          Assessment & Plan:   Preventive health care History of aortic insufficiency, stable Hypertension, controlled Osteopenia.  Continue calcium and vitamin D supplements   Cardiology follow-up Recheck one year

## 2015-09-17 ENCOUNTER — Telehealth: Payer: Self-pay

## 2015-09-17 ENCOUNTER — Ambulatory Visit (INDEPENDENT_AMBULATORY_CARE_PROVIDER_SITE_OTHER): Payer: Commercial Managed Care - HMO

## 2015-09-17 DIAGNOSIS — Z23 Encounter for immunization: Secondary | ICD-10-CM

## 2015-09-17 DIAGNOSIS — Z0000 Encounter for general adult medical examination without abnormal findings: Secondary | ICD-10-CM | POA: Diagnosis not present

## 2015-09-17 NOTE — Progress Notes (Addendum)
Subjective:   Natalie Fitzgerald is a 82 y.o. female who presents for Medicare Annual (Subsequent) preventive examination.  HRA assessment completed during this visit with Natalie Fitzgerald   The Patient was informed that the wellness visit is to identify future health risk and educate and initiate measures that can reduce risk for increased disease through the lifespan.    NO ROS; Medicare Wellness Visit Last OV:  01/2015 Labs completed: 01/26/2015 Lipids 01/2014; Trig 126; HDL 47; LDL 110   Has a quinea pig pet at home  Has 3 children in town;  Son end stage COPD Baby girl has end stage COPD Oldest has OA  Children call her, help with shopping; bring her lunch etc    Social History   Social History  . Marital status: Married    Spouse name: N/A  . Number of children: N/A  . Years of education: N/A   Occupational History  . Not on file.   Social History Main Topics  . Smoking status: Never Smoker  . Smokeless tobacco: Not on file  . Alcohol use No  . Drug use: No  . Sexual activity: Not on file   Other Topics Concern  . Not on file   Social History Narrative  . No narrative on file   fup  Tobacco / no hx Etoh / very limited / only medicinal use   Likes the history channel and discovery channel  Medications; no issues   BMI: 26.8 (wnl for seniors)  Diet: oatmeal and banana for breakfast; 10am:  eats a snack  Steams vegetables;  Bakes potatoes in microwave Likes vegetables; loves grapes  Dtrs will pick her up and take her places;  Brings in lunch every day;  Weight is stable; was 160;  156 today but states she has lost weight   Teeth or Denture issues? No issues  Dentist x 2 per year  Exercise;  tries to walk x 20 minutes per day Goes up and down 7 steps to the bathroom 7 steps down to do laundry  Asked what she would do if she needed help? 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  Started taking cell phone in the bathroom with her  I  Fall hx; fell going up the stairs on nightgown; Now has shower gowns.    Given education on "Fall Prevention in the Home" for more safety tips the patient can apply as appropriate.  Long term goal is to "age in place" or undecided  States on child wants her to move in downstairs at her home  Started thinking it would be warmer in the winter; cooler in the summer; can set up kitchenette downstairs. Has bathroom but will still go up stairs to shower.   Safety features reviewed for safe community; firearms if in the home; smoke alarms; sun protection when outside; driving difficulties or accidents   Mental Health:  Any emotional problems? Anxious, depressed, irritable, sad or blue? no Denies feeling depressed or hopeless; voices pleasure in daily life no  Who would help you with chores; illness; shopping other? Middle dtr is doing more now  Doesn't drive at hs except on very few occasions close to home   Pain: no  Cognitive; no failures at independent living  Manages checkbook, medications; no failures of task Ad8 score reviewed for  issues;  Issues making decisions; no  Less interest in hobbies / activities" no  Repeats questions, stories; family complaining: NO  Trouble using ordinary gadgets; microwave; computer: no  Forgets the month or year: no  Mismanaging finances: just now decided to get POW via [place] her middle dtr.   Missing apt: no but does write them down  Daily problems with thinking of memory NO Ad8 score is 0   Any dizziness when standing up? No   Mobilization and Functional losses from last year to this year? no  Sleep pattern changes; up to the bathroom to void. Asked to discuss with her doctor if she would like to try medicine  Hearing: 2000hz  both ears  Ophthalmology exam; Natalie Fitzgerald ; biannual eye visit     Advanced Directive  addressed; Completed  May take organ donor off; has middle dtr as POA     Counseling Health Maintenance Gaps: Health Maintenance Due  Topic Date Due  . DEXA SCAN  01/01/1995  . INFLUENZA VACCINE  08/10/2015   Declined shingles; not sure she every had chicken pox   DEXA; declined at this time  Recommendations for Dexa Scan Female over the age of 32 If you broke a bone past the age of 33 Women menopausal age with risk factors (thin frame; smoker; hx of fx ) Post menopausal women under the age of 45 with risk factors  Strength building exercises discussed; can include walking; housework; small weights or stretch bands; silver sneakers [facility] to the [facility]  Agreed to take the flu shot today (high dose)    Colonoscopy; 06/2008/ AGED OUT  EKG: 01/2015 Mammogram: 06/2010 Dexa/ no scan noted DECLINED TODAY   Eye checked; no med for ? Glaucoma Natalie Fitzgerald no meds but watching   Individual Goal:  To continue to exercise some   Health Recommendations and Referrals  Barriers to Success Frail and aging; but get up and go is good Fairly good support. Is checked on often;  Mood bright and stable  Cardiac Risk Factors include: advanced age (>45men, >14 women);sedentary lifestyle     Objective:     Vitals: BP 130/60   Pulse 79   Ht 5\' 4"  (1.626 m)   Wt 156 lb 8 oz (71 kg)   SpO2 98%   BMI 26.86 kg/m   Body mass index is 26.86 kg/m.   Tobacco History  Smoking Status  . Never Smoker  Smokeless Tobacco  . Not on file     Counseling given: Yes   Past Medical History:  Diagnosis Date  . Aortic valve disorders    moderate aortic insufficiency  . Atypical mole    nose  . Dizziness   . Glaucoma   . Nipple problem    Past Surgical History:  Procedure Laterality Date  . APPENDECTOMY    . BACK SURGERY     Family History  Problem Relation Age of Onset  . Cancer Mother     kidney  . Alcohol abuse Father    History  Sexual Activity  . Sexual activity: Not on  file    Outpatient Encounter Prescriptions as of 09/17/2015  Medication Sig  . amLODipine (NORVASC) 2.5 MG tablet Take 1 tablet (2.5 mg total) by mouth daily.  Marland Kitchen aspirin 81 MG tablet Take 81 mg by mouth daily.    . hydrocortisone 1 % lotion Apply 1 application topically 2 (two) times daily. (Patient taking differently: Apply 1 application topically as needed. )   No  facility-administered encounter medications on file as of 09/17/2015.     Activities of Daily Living In your present state of health, do you have any difficulty performing the following activities: 09/17/2015 09/17/2015  Hearing? - N  Vision? - N  Difficulty concentrating or making decisions? - N  Walking or climbing stairs? - N  Dressing or bathing? - N  Doing errands, shopping? - N  Conservation officer, nature and eating ? - N  Using the Toilet? - N  In the past six months, have you accidently leaked urine? Y N  Do you have problems with loss of bowel control? - N  Managing your Medications? - N  Managing your Finances? - N  Housekeeping or managing your Housekeeping? - N  Some recent data might be hidden    Patient Care Team: Marletta Lor, MD as PCP - General    Assessment:    Assessment included:  Review for health history  Self Assessment of health status; poor, fair, good or Great  Psychosocial risk; stress; depression; pain; lack of support; lack of income to buy groceries, meds etc.  Behavioral risk addressed such as tobacco, ETOH; diet (metabolic syndrome) and exercise  Loss of ability to function independently with ADLs or IADLs  Risk for independent living   Risk for safety; Bathroom; falls or railing or a decline in gait; which include review of osteopenia   Review of over due preventive screens with plan to obtain or educate as appropriate   Changes in mental status   Medication reconciliation, past medical history, social history, problem list and allergies were reviewed in detail with the  patient  Goals were established with regard to weight loss, exercise, and diet in compliance with medications based on the patient individualized risk;   End of life planning was discussed.     Exercise Activities and Dietary recommendations Current Exercise Habits: Home exercise routine, Type of exercise: walking, Time (Minutes): 30, Frequency (Times/Week): 3, Weekly Exercise (Minutes/Week): 90, Intensity: Mild  Goals    . Exercise 150 minutes per week (moderate activity)          Using the  Stretch for ARMS AND WORK WITH LEGS     . patient          Patient may move downstairs to make life easier;  No steps; can get outside; has a bathroom       Fall Risk Fall Risk  09/17/2015 01/26/2015 01/22/2014  Falls in the past year? Yes Yes No  Number falls in past yr: 1 1 -  Injury with Fall? - Yes -  Risk for fall due to : - Other (Comment) -  Risk for fall due to (comments): - syncope episode -  Follow up Education provided - -   Depression Screen PHQ 2/9 Scores 01/26/2015 01/22/2014  PHQ - 2 Score 0 0     Cognitive Testing MMSE - Mini Mental State Exam 09/17/2015  Not completed: (No Data)   Ad8 score 0   Immunization History  Administered Date(s) Administered  . Influenza Split 11/30/2011  . Influenza Whole 10/24/2006, 10/22/2007, 10/06/2008  . Influenza, High Dose Seasonal PF 12/09/2014, 09/17/2015  . Influenza,inj,Quad PF,36+ Mos 12/23/2012, 10/23/2013  . Pneumococcal Conjugate-13 01/22/2014  . Pneumococcal-Unspecified 11/05/2010  . Td 12/23/2012   Screening Tests Health Maintenance  Topic Date Due  . DEXA SCAN  01/01/1995  . INFLUENZA VACCINE  08/10/2015  . ZOSTAVAX  10/06/2016 (Originally 12/31/1989)  . TETANUS/TDAP  12/24/2022  . PNA vac  Low Risk Adult  Completed      Plan:   Will take high does flu shot today  Declined shingles  Declined dexa scan;   May discuss urinary frequency with Dr. Raliegh Ip if she would like.  Discussed multiple bathroom trips at hs  put her more at risk for falls.  During the course of the visit the patient was educated and counseled about the following appropriate screening and preventive services:   Vaccines to include Pneumoccal, Influenza, Hepatitis B, Td, Zostavax, HCV  Electrocardiogram  Cardiovascular Disease  Colorectal cancer screening/ aged out  Bone density screening/ declined   Diabetes screening/neg  Glaucoma screening/ mild; no treatment; Natalie Fitzgerald   Mammography/aged out   Nutrition counseling / adequate; appears stable, eats a lot of vegetables   Patient Instructions (the written plan) was given to the patient.   Wynetta Fines, RN  09/17/2015  Results of annual Medicare wellness visit reviewed.  Agree with findings  Nyoka Cowden

## 2015-09-17 NOTE — Telephone Encounter (Signed)
In for AWV. With cane today. Needs handicapped sticker renewed.  Completed and signed form; Please review and sign and mail.  Tks

## 2015-09-17 NOTE — Patient Instructions (Addendum)
[name] , Thank you for taking time to come for your Medicare Wellness Visit. I appreciate your ongoing commitment to your health goals. Please review the following plan we discussed and let me know if I can assist you in the future.   Will take flu shot today; (high does)   Will continue to use stretch bands  Can discuss urinary issues with Dr. Raliegh Ip at the next visit  Will decline shingles and dexa scan;     These are the goals we discussed: Goals    . Exercise 150 minutes per week (moderate activity)          Using the  Stretch for ARMS AND WORK WITH LEGS     . patient          Patient may move downstairs to make life easier;  No steps; can get outside; has a bathroom        This is a list of the screening recommended for you and due dates:  Health Maintenance  Topic Date Due  . DEXA scan (bone density measurement)  01/01/1995  . Flu Shot  08/10/2015  . Shingles Vaccine  10/06/2016*  . Tetanus Vaccine  12/24/2022  . Pneumonia vaccines  Completed  *Topic was postponed. The date shown is not the original due date.       Fall Prevention in the Home  Falls can cause injuries. They can happen to people of all ages. There are many things you can do to make your home safe and to help prevent falls.  WHAT CAN I DO ON THE OUTSIDE OF MY HOME?  Regularly fix the edges of walkways and driveways and fix any cracks.  Remove anything that might make you trip as you walk through a door, such as a raised step or threshold.  Trim any bushes or trees on the path to your home.  Use bright outdoor lighting.  Clear any walking paths of anything that might make someone trip, such as rocks or tools.  Regularly check to see if handrails are loose or broken. Make sure that both sides of any steps have handrails.  Any raised decks and porches should have guardrails on the edges.  Have any leaves, snow, or ice cleared regularly.  Use sand or salt on walking paths during  winter.  Clean up any spills in your garage right away. This includes oil or grease spills. WHAT CAN I DO IN THE BATHROOM?   Use night lights.  Install grab bars by the toilet and in the tub and shower. Do not use towel bars as grab bars.  Use non-skid mats or decals in the tub or shower.  If you need to sit down in the shower, use a plastic, non-slip stool.  Keep the floor dry. Clean up any water that spills on the floor as soon as it happens.  Remove soap buildup in the tub or shower regularly.  Attach bath mats securely with double-sided non-slip rug tape.  Do not have throw rugs and other things on the floor that can make you trip. WHAT CAN I DO IN THE BEDROOM?  Use night lights.  Make sure that you have a light by your bed that is easy to reach.  Do not use any sheets or blankets that are too big for your bed. They should not hang down onto the floor.  Have a firm chair that has side arms. You can use this for support while you get dressed.  Do  not have throw rugs and other things on the floor that can make you trip. WHAT CAN I DO IN THE KITCHEN?  Clean up any spills right away.  Avoid walking on wet floors.  Keep items that you use a lot in easy-to-reach places.  If you need to reach something above you, use a strong step stool that has a grab bar.  Keep electrical cords out of the way.  Do not use floor polish or wax that makes floors slippery. If you must use wax, use non-skid floor wax.  Do not have throw rugs and other things on the floor that can make you trip. WHAT CAN I DO WITH MY STAIRS?  Do not leave any items on the stairs.  Make sure that there are handrails on both sides of the stairs and use them. Fix handrails that are broken or loose. Make sure that handrails are as long as the stairways.  Check any carpeting to make sure that it is firmly attached to the stairs. Fix any carpet that is loose or worn.  Avoid having throw rugs at the top or  bottom of the stairs. If you do have throw rugs, attach them to the floor with carpet tape.  Make sure that you have a light switch at the top of the stairs and the bottom of the stairs. If you do not have them, ask someone to add them for you. WHAT ELSE CAN I DO TO HELP PREVENT FALLS?  Wear shoes that:  Do not have high heels.  Have rubber bottoms.  Are comfortable and fit you well.  Are closed at the toe. Do not wear sandals.  If you use a stepladder:  Make sure that it is fully opened. Do not climb a closed stepladder.  Make sure that both sides of the stepladder are locked into place.  Ask someone to hold it for you, if possible.  Clearly mark and make sure that you can see:  Any grab bars or handrails.  First and last steps.  Where the edge of each step is.  Use tools that help you move around (mobility aids) if they are needed. These include:  Canes.  Walkers.  Scooters.  Crutches.  Turn on the lights when you go into a dark area. Replace any light bulbs as soon as they burn out.  Set up your furniture so you have a clear path. Avoid moving your furniture around.  If any of your floors are uneven, fix them.  If there are any pets around you, be aware of where they are.  Review your medicines with your doctor. Some medicines can make you feel dizzy. This can increase your chance of falling. Ask your doctor what other things that you can do to help prevent falls.   This information is not intended to replace advice given to you by your health care provider. Make sure you discuss any questions you have with your health care provider.   Document Released: 10/22/2008 Document Revised: 05/12/2014 Document Reviewed: 01/30/2014 Elsevier Interactive Patient Education 2016 [place] Maintenance, Female Adopting a healthy lifestyle and getting preventive care can go a long way to promote health and wellness. Talk with your health care provider about  what schedule of regular examinations is right for you. This is a good chance for you to check in with your provider about disease prevention and staying healthy. In between checkups, there are plenty of things you can do on your own. Experts  have done a lot of research about which lifestyle changes and preventive measures are most likely to keep you healthy. Ask your health care provider for more information. WEIGHT AND DIET  Eat a healthy diet  Be sure to include plenty of vegetables, fruits, low-fat dairy products, and lean protein.  Do not eat a lot of foods high in solid fats, added sugars, or salt.  Get regular exercise. This is one of the most important things you can do for your health.  Most adults should exercise for at least 150 minutes each week. The exercise should increase your heart rate and make you sweat (moderate-intensity exercise).  Most adults should also do strengthening exercises at least twice a week. This is in addition to the moderate-intensity exercise.  Maintain a healthy weight  Body mass index (BMI) is a measurement that can be used to identify possible weight problems. It estimates body fat based on height and weight. Your health care provider can help determine your BMI and help you achieve or maintain a healthy weight.  For females 84 years of age and older:   A BMI below 18.5 is considered underweight.  A BMI of 18.5 to 24.9 is normal.  A BMI of 25 to 29.9 is considered overweight.  A BMI of 30 and above is considered obese.  Watch levels of cholesterol and blood lipids  You should start having your blood tested for lipids and cholesterol at 80 years of age, then have this test every 5 years.  You may need to have your cholesterol levels checked more often if:  Your lipid or cholesterol levels are high.  You are older than 80 years of age.  You are at high risk for heart disease.  CANCER SCREENING   Lung Cancer  Lung cancer screening is  recommended for adults 37-23 years old who are at high risk for lung cancer because of a history of smoking.  A yearly low-dose CT scan of the lungs is recommended for people who:  Currently smoke.  Have quit within the past 15 years.  Have at least a 30-pack-year history of smoking. A pack year is smoking an average of one pack of cigarettes a day for 1 year.  Yearly screening should continue until it has been 15 years since you quit.  Yearly screening should stop if you develop a health problem that would prevent you from having lung cancer treatment.  Breast Cancer  Practice breast self-awareness. This means understanding how your breasts normally appear and feel.  It also means doing regular breast self-exams. Let your health care provider know about any changes, no matter how small.  If you are in your 20s or 30s, you should have a clinical breast exam (CBE) by a health care provider every 1-3 years as part of a regular health exam.  If you are 35 or older, have a CBE every year. Also consider having a breast X-ray (mammogram) every year.  If you have a family history of breast cancer, talk to your health care provider about genetic screening.  If you are at high risk for breast cancer, talk to your health care provider about having an MRI and a mammogram every year.  Breast cancer gene (BRCA) assessment is recommended for women who have family members with BRCA-related cancers. BRCA-related cancers include:  Breast.  Ovarian.  Tubal.  Peritoneal cancers.  Results of the assessment will determine the need for genetic counseling and BRCA1 and BRCA2 testing. Cervical Cancer Your  health care provider may recommend that you be screened regularly for cancer of the pelvic organs (ovaries, uterus, and vagina). This screening involves a pelvic examination, including checking for microscopic changes to the surface of your cervix (Pap test). You may be encouraged to have this  screening done every 3 years, beginning at age 51.  For women ages 54-65, health care providers may recommend pelvic exams and Pap testing every 3 years, or they may recommend the Pap and pelvic exam, combined with testing for human papilloma virus (HPV), every 5 years. Some types of HPV increase your risk of cervical cancer. Testing for HPV may also be done on women of any age with unclear Pap test results.  Other health care providers may not recommend any screening for nonpregnant women who are considered low risk for pelvic cancer and who do not have symptoms. Ask your health care provider if a screening pelvic exam is right for you.  If you have had past treatment for cervical cancer or a condition that could lead to cancer, you need Pap tests and screening for cancer for at least 20 years after your treatment. If Pap tests have been discontinued, your risk factors (such as having a new sexual partner) need to be reassessed to determine if screening should resume. Some women have medical problems that increase the chance of getting cervical cancer. In these cases, your health care provider may recommend more frequent screening and Pap tests. Colorectal Cancer  This type of cancer can be detected and often prevented.  Routine colorectal cancer screening usually begins at 80 years of age and continues through 80 years of age.  Your health care provider may recommend screening at an earlier age if you have risk factors for colon cancer.  Your health care provider may also recommend using home test kits to check for hidden blood in the stool.  A small camera at the end of a tube can be used to examine your colon directly (sigmoidoscopy or colonoscopy). This is done to check for the earliest forms of colorectal cancer.  Routine screening usually begins at age 6.  Direct examination of the colon should be repeated every 5-10 years through 80 years of age. However, you may need to be screened  more often if early forms of precancerous polyps or small growths are found. Skin Cancer  Check your skin from head to toe regularly.  Tell your health care provider about any new moles or changes in moles, especially if there is a change in a mole's shape or color.  Also tell your health care provider if you have a mole that is larger than the size of a pencil eraser.  Always use sunscreen. Apply sunscreen liberally and repeatedly throughout the day.  Protect yourself by wearing long sleeves, pants, a wide-brimmed hat, and sunglasses whenever you are outside. HEART DISEASE, DIABETES, AND HIGH BLOOD PRESSURE   High blood pressure causes heart disease and increases the risk of stroke. High blood pressure is more likely to develop in:  People who have blood pressure in the high end of the normal range (130-139/85-89 mm Hg).  People who are overweight or obese.  People who are African American.  If you are 59-65 years of age, have your blood pressure checked every 3-5 years. If you are 22 years of age or older, have your blood pressure checked every year. You should have your blood pressure measured twice--once when you are at a hospital or clinic, and once when  you are not at a hospital or clinic. Record the average of the two measurements. To check your blood pressure when you are not at a hospital or clinic, you can use:  An automated blood pressure machine at a pharmacy.  A home blood pressure monitor.  If you are between 65 years and 59 years old, ask your health care provider if you should take aspirin to prevent strokes.  Have regular diabetes screenings. This involves taking a blood sample to check your fasting blood sugar level.  If you are at a normal weight and have a low risk for diabetes, have this test once every three years after 80 years of age.  If you are overweight and have a high risk for diabetes, consider being tested at a younger age or more often. PREVENTING  INFECTION  Hepatitis B  If you have a higher risk for hepatitis B, you should be screened for this virus. You are considered at high risk for hepatitis B if:  You were born in a country where hepatitis B is common. Ask your health care provider which countries are considered high risk.  Your parents were born in a high-risk country, and you have not been immunized against hepatitis B (hepatitis B vaccine).  You have HIV or AIDS.  You use needles to inject street drugs.  You live with someone who has hepatitis B.  You have had sex with someone who has hepatitis B.  You get hemodialysis treatment.  You take certain medicines for conditions, including cancer, organ transplantation, and autoimmune conditions. Hepatitis C  Blood testing is recommended for:  Everyone born from [birth year] through 1963.  Anyone with known risk factors for hepatitis C. Sexually transmitted infections (STIs)  You should be screened for sexually transmitted infections (STIs) including gonorrhea and chlamydia if:  You are sexually active and are younger than 80 years of age.  You are older than 80 years of age and your health care provider tells you that you are at risk for this type of infection.  Your sexual activity has changed since you were last screened and you are at an increased risk for chlamydia or gonorrhea. Ask your health care provider if you are at risk.  If you do not have HIV, but are at risk, it may be recommended that you take a prescription medicine daily to prevent HIV infection. This is called pre-exposure prophylaxis (PrEP). You are considered at risk if:  You are sexually active and do not regularly use condoms or know the HIV status of your partner(s).  You take drugs by injection.  You are sexually active with a partner who has HIV. Talk with your health care provider about whether you are at high risk of being infected with HIV. If you choose to begin PrEP, you should first be  tested for HIV. You should then be tested every 3 months for as long as you are taking PrEP.  PREGNANCY   If you are premenopausal and you may become pregnant, ask your health care provider about preconception counseling.  If you may become pregnant, take 400 to 800 micrograms (mcg) of folic acid every day.  If you want to prevent pregnancy, talk to your health care provider about birth control (contraception). OSTEOPOROSIS AND MENOPAUSE   Osteoporosis is a disease in which the bones lose minerals and strength with aging. This can result in serious bone fractures. Your risk for osteoporosis can be identified using a bone density scan.  If you  are 64 years of age or older, or if you are at risk for osteoporosis and fractures, ask your health care provider if you should be screened.  Ask your health care provider whether you should take a calcium or vitamin D supplement to lower your risk for osteoporosis.  Menopause may have certain physical symptoms and risks.  Hormone replacement therapy may reduce some of these symptoms and risks. Talk to your health care provider about whether hormone replacement therapy is right for you.  HOME CARE INSTRUCTIONS   Schedule regular health, dental, and eye exams.  Stay current with your immunizations.   Do not use any tobacco products including cigarettes, chewing tobacco, or electronic cigarettes.  If you are pregnant, do not drink alcohol.  If you are breastfeeding, limit how much and how often you drink alcohol.  Limit alcohol intake to no more than 1 drink per day for nonpregnant women. One drink equals 12 ounces of beer, 5 ounces of wine, or 1 ounces of hard liquor.  Do not use street drugs.  Do not share needles.  Ask your health care provider for help if you need support or information about quitting drugs.  Tell your health care provider if you often feel depressed.  Tell your health care provider if you have ever been abused or  do not feel safe at home.   This information is not intended to replace advice given to you by your health care provider. Make sure you discuss any questions you have with your health care provider.   Document Released: 07/11/2010 Document Revised: 01/16/2014 Document Reviewed: 11/27/2012 Elsevier Interactive Patient Education [employer].

## 2015-09-20 NOTE — Telephone Encounter (Signed)
Left detailed message on personal voicemail handicapped placard form filled out and put in the mail. Any questions please call office.

## 2016-01-18 ENCOUNTER — Encounter: Payer: Self-pay | Admitting: *Deleted

## 2016-01-18 ENCOUNTER — Ambulatory Visit: Payer: Commercial Managed Care - HMO | Admitting: Cardiovascular Disease

## 2016-04-07 ENCOUNTER — Other Ambulatory Visit: Payer: Self-pay | Admitting: Internal Medicine

## 2016-04-07 DIAGNOSIS — I10 Essential (primary) hypertension: Secondary | ICD-10-CM

## 2016-04-07 DIAGNOSIS — Z23 Encounter for immunization: Secondary | ICD-10-CM

## 2016-05-12 ENCOUNTER — Ambulatory Visit (INDEPENDENT_AMBULATORY_CARE_PROVIDER_SITE_OTHER): Payer: Medicare HMO | Admitting: Cardiovascular Disease

## 2016-05-12 ENCOUNTER — Encounter: Payer: Self-pay | Admitting: Cardiovascular Disease

## 2016-05-12 VITALS — BP 146/72 | HR 74 | Ht 64.0 in | Wt 152.8 lb

## 2016-05-12 DIAGNOSIS — E7800 Pure hypercholesterolemia, unspecified: Secondary | ICD-10-CM | POA: Diagnosis not present

## 2016-05-12 DIAGNOSIS — I351 Nonrheumatic aortic (valve) insufficiency: Secondary | ICD-10-CM

## 2016-05-12 DIAGNOSIS — I10 Essential (primary) hypertension: Secondary | ICD-10-CM

## 2016-05-12 NOTE — Progress Notes (Signed)
     05/12/2016 [place]   Dec 16, 1927  [id number]  Primary Physician Nyoka Cowden, MD Primary Cardiologist: Lorretta Harp MD [name]  HPI:  Mrs. Heming is a delightful 83 year old moderately overweight widowed Caucasian female mother of 4 children, grandmother of 5 grandchildren self-referred to our [place] office because of convenience and parking issues at the [employer]. Her prior cardiologist was Dr. Cathie Olden. Last saw her in the office 01/15/15.She has a history of questionable hypertension as well as mild aortic insufficiency by 2-D echo as recently as 01/21/14 with normal LV size and function. She tells me that she was put on low-dose amlodipine because of her aortic insufficiency for "afterload reduction". She has never had a heart attack or stroke. She denies chest pain or shortness of breath.    Current Outpatient Prescriptions  Medication Sig Dispense Refill  . amLODipine (NORVASC) 2.5 MG tablet TAKE ONE TABLET BY MOUTH ONCE DAILY 90 tablet 3  . aspirin 81 MG tablet Take 81 mg by mouth daily.      . hydrocortisone 1 % lotion Apply 1 application topically 2 (two) times daily. (Patient taking differently: Apply 1 application topically as needed. ) 118 mL 0   No current facility-administered medications for this visit.     No Known Allergies  Social History   Social History  . Marital status: Married    Spouse name: N/A  . Number of children: N/A  . Years of education: N/A   Occupational History  . Not on file.   Social History Main Topics  . Smoking status: Never Smoker  . Smokeless tobacco: Never Used  . Alcohol use No  . Drug use: No  . Sexual activity: Not on file   Other Topics Concern  . Not on file   Social History Narrative  . No narrative on file     Review of Systems: General: negative for chills, fever, night sweats or weight changes.  Cardiovascular: negative for chest pain, dyspnea on exertion,  edema, orthopnea, palpitations, paroxysmal nocturnal dyspnea or shortness of breath Dermatological: negative for rash Respiratory: negative for cough or wheezing Urologic: negative for hematuria Abdominal: negative for nausea, vomiting, diarrhea, bright red blood per rectum, melena, or hematemesis Neurologic: negative for visual changes, syncope, or dizziness All other systems reviewed and are otherwise negative except as noted above.    Blood pressure (!) 146/72, pulse 74, height 5\' 4"  (1.626 m), weight 152 lb 12.8 oz (69.3 kg).  General appearance: alert and no distress Neck: no adenopathy, no carotid bruit, no JVD, supple, symmetrical, trachea midline and thyroid not enlarged, symmetric, no tenderness/mass/nodules Lungs: clear to auscultation bilaterally Heart: regular rate and rhythm, S1, S2 normal, no murmur, click, rub or gallop Extremities: extremities normal, atraumatic, no cyanosis or edema  EKG normal sinus rhythm at 74 without ST or T-wave changes. I personally reviewed this EKG  ASSESSMENT AND PLAN:   Hyperlipemia History of hyperlipidemia not on statin therapy followed by her PCP  Hypertension History of hypertension blood pressure measures 130/60. She is on amlodipine. Continue current meds at current dosing  Aortic insufficiency History of mild aortic insufficiency on amlodipine for afterload reduction. Her last 2-D echocardiogram performed 01/21/14 revealed normal LV size and function with mild aortic insufficiency.      Lorretta Harp MD FACP,FACC,FAHA, FSCAI 05/12/2016 1:50 PM

## 2016-05-12 NOTE — Patient Instructions (Signed)

## 2016-05-12 NOTE — Assessment & Plan Note (Signed)
History of hyperlipidemia not on statin therapy followed by her PCP 

## 2016-05-12 NOTE — Assessment & Plan Note (Signed)
History of mild aortic insufficiency on amlodipine for afterload reduction. Her last 2-D echocardiogram performed 01/21/14 revealed normal LV size and function with mild aortic insufficiency.

## 2016-05-12 NOTE — Assessment & Plan Note (Signed)
History of hypertension blood pressure measures 130/60. She is on amlodipine. Continue current meds at current dosing

## 2016-06-28 ENCOUNTER — Encounter: Payer: Self-pay | Admitting: Internal Medicine

## 2016-06-28 ENCOUNTER — Ambulatory Visit (INDEPENDENT_AMBULATORY_CARE_PROVIDER_SITE_OTHER): Payer: Medicare HMO | Admitting: Internal Medicine

## 2016-06-28 VITALS — BP 118/74 | HR 80 | Temp 98.1°F | Ht 64.0 in | Wt 152.2 lb

## 2016-06-28 DIAGNOSIS — Z0000 Encounter for general adult medical examination without abnormal findings: Secondary | ICD-10-CM

## 2016-06-28 DIAGNOSIS — J301 Allergic rhinitis due to pollen: Secondary | ICD-10-CM

## 2016-06-28 DIAGNOSIS — I351 Nonrheumatic aortic (valve) insufficiency: Secondary | ICD-10-CM

## 2016-06-28 DIAGNOSIS — I10 Essential (primary) hypertension: Secondary | ICD-10-CM

## 2016-06-28 LAB — CBC WITH DIFFERENTIAL/PLATELET
BASOS ABS: 0 10*3/uL (ref 0.0–0.1)
Basophils Relative: 0.5 % (ref 0.0–3.0)
EOS ABS: 0.4 10*3/uL (ref 0.0–0.7)
Eosinophils Relative: 4.8 % (ref 0.0–5.0)
HEMATOCRIT: 36.4 % (ref 36.0–46.0)
HEMOGLOBIN: 12.1 g/dL (ref 12.0–15.0)
LYMPHS PCT: 22.3 % (ref 12.0–46.0)
Lymphs Abs: 1.8 10*3/uL (ref 0.7–4.0)
MCHC: 33.4 g/dL (ref 30.0–36.0)
MCV: 94.4 fl (ref 78.0–100.0)
Monocytes Absolute: 0.6 10*3/uL (ref 0.1–1.0)
Monocytes Relative: 7.3 % (ref 3.0–12.0)
Neutro Abs: 5.1 10*3/uL (ref 1.4–7.7)
Neutrophils Relative %: 65.1 % (ref 43.0–77.0)
Platelets: 247 10*3/uL (ref 150.0–400.0)
RBC: 3.85 Mil/uL — ABNORMAL LOW (ref 3.87–5.11)
RDW: 13.6 % (ref 11.5–15.5)
WBC: 7.8 10*3/uL (ref 4.0–10.5)

## 2016-06-28 LAB — COMPREHENSIVE METABOLIC PANEL
ALBUMIN: 3.8 g/dL (ref 3.5–5.2)
ALT: 12 U/L (ref 0–35)
AST: 16 U/L (ref 0–37)
Alkaline Phosphatase: 74 U/L (ref 39–117)
BILIRUBIN TOTAL: 0.3 mg/dL (ref 0.2–1.2)
BUN: 22 mg/dL (ref 6–23)
CALCIUM: 9.5 mg/dL (ref 8.4–10.5)
CHLORIDE: 100 meq/L (ref 96–112)
CO2: 23 mEq/L (ref 19–32)
CREATININE: 1.32 mg/dL — AB (ref 0.40–1.20)
GFR: 40.51 mL/min — AB (ref >60.00)
Glucose, Bld: 101 mg/dL — ABNORMAL HIGH (ref 70–99)
Potassium: 4.6 mEq/L (ref 3.5–5.1)
Sodium: 132 mEq/L — ABNORMAL LOW (ref 135–145)
Total Protein: 6.3 g/dL (ref 6.0–8.3)

## 2016-06-28 LAB — TSH: TSH: 3.16 u[IU]/mL (ref 0.35–4.50)

## 2016-06-28 NOTE — Progress Notes (Signed)
Subjective:    Patient ID: Natalie Fitzgerald, female    DOB: April 23, 1927, 83 y.o.   MRN: [id number]  HPI 83 year old patient who is seen today for annual exam and Medicare wellness visit.  She has a history of mild AI and is followed by cardiology annually.  She has essential hypertension and has been on amlodipine. More recently, blood pressure readings have been slightly low Otherwise, doing well without concerns or complaints Accompanied by her daughter today  Blood pressure readings at home are often low.  Since her last visit here, she has had a syncopal episode associated with heat exposure and poor oral intake  She now walks with a cane  She has been seen by cardiology recently and is scheduled for annual visits  Current Allergies: No known allergies   Past Medical History:   Allergic rhinitis  Hyperlipidemia  Positive PPD  Low back pain  Osteopenia   Family History:   father died age 6. Gunshot wound history of EtOH  mother died in her 72s. Renal cancer    One brother history of hypertension and diabetes; history of CVA  Past Medical History:  Diagnosis Date  . Aortic valve disorders    moderate aortic insufficiency  . Atypical mole    nose  . Dizziness   . Glaucoma   . Nipple problem      Social History   Social History  . Marital status: Married    Spouse name: N/A  . Number of children: N/A  . Years of education: N/A   Occupational History  . Not on file.   Social History Main Topics  . Smoking status: Never Smoker  . Smokeless tobacco: Never Used  . Alcohol use No  . Drug use: No  . Sexual activity: Not on file   Other Topics Concern  . Not on file   Social History Narrative  . No narrative on file    Past Surgical History:  Procedure Laterality Date  . APPENDECTOMY    . BACK SURGERY      Family History  Problem Relation Age of Onset  . Cancer Mother        kidney  .  Alcohol abuse Father     No Known Allergies  Current Outpatient Prescriptions on File Prior to Visit  Medication Sig Dispense Refill  . amLODipine (NORVASC) 2.5 MG tablet TAKE ONE TABLET BY MOUTH ONCE DAILY 90 tablet 3  . aspirin 81 MG tablet Take 81 mg by mouth daily.      . hydrocortisone 1 % lotion Apply 1 application topically 2 (two) times daily. (Patient taking differently: Apply 1 application topically as needed. ) 118 mL 0   No current facility-administered medications on file prior to visit.     BP 118/74 (BP Location: Left Arm, Patient Position: Sitting, Cuff Size: Normal)   Pulse 80   Temp 98.1 F (36.7 C) (Oral)   Ht 5\' 4"  (1.626 m)   Wt 152 lb 3.2 oz (69 kg)   SpO2 96%   BMI 26.13 kg/m   Subsequent Medicare wellness visit    1. Risk factors, based on past  M,S,F history.  Cardiovascular risk factors include hypertension, a mild dyslipidemia.  Patient has a history of mild aortic insufficiency.  2.  Physical activities: No restrictions.  Activity level has fallen off a bit complains of some mild gait instability  3.  Depression/mood: No history of depression or mood disorder  4.  Hearing: No deficits  5.  ADL's: Independent in all aspects of daily living  6.  Fall risk: moderate due to age and gait instability  7.  Home safety: No problems identified  8.  Height weight, and visual acuity; height and weight stable no change in visual acuity.  Has had bilateral cataract extraction surgery.  Is followed by ophthalmology for glaucoma which has been stable  62.  Counseling: Continue regular exercise and heart healthy diet  10. Lab orders based on risk factors: Laboratory update, including lipid profile will be reviewed  11. Referral : Follow-up cardiology  12. Care plan: Continue heart healthy diet and active lifestyle  13. Cognitive assessment: Alert and oriented with normal affect.  No cognitive dysfunction  14. Screening: Continue annual  primary care evaluation with screening lab  15. Provider List Update: Includes cardiology and primary care    Review of Systems  Constitutional: Negative.   HENT: Negative for congestion, dental problem, hearing loss, rhinorrhea, sinus pressure, sore throat and tinnitus.   Eyes: Negative for pain, discharge and visual disturbance.  Respiratory: Negative for cough and shortness of breath.   Cardiovascular: Negative for chest pain, palpitations and leg swelling.  Gastrointestinal: Negative for abdominal distention, abdominal pain, blood in stool, constipation, diarrhea, nausea and vomiting.  Genitourinary: Negative for difficulty urinating, dysuria, flank pain, frequency, hematuria, pelvic pain, urgency, vaginal bleeding, vaginal discharge and vaginal pain.  Musculoskeletal: Positive for arthralgias, back pain and gait problem. Negative for joint swelling.  Skin: Negative for rash.  Neurological: Negative for dizziness, syncope, speech difficulty, weakness, numbness and headaches.  Hematological: Negative for adenopathy.  Psychiatric/Behavioral: Negative for agitation, behavioral problems and dysphoric mood. The patient is not nervous/anxious.        Objective:   Physical Exam  Constitutional: She is oriented to person, place, and time. She appears well-developed and well-nourished.  Blood pressure 100/66  HENT:  Head: Normocephalic and atraumatic.  Right Ear: External ear normal.  Left Ear: External ear normal.  Mouth/Throat: Oropharynx is clear and moist.  Eyes: Conjunctivae and EOM are normal.  Neck: Normal range of motion. Neck supple. No JVD present. No thyromegaly present.  Cardiovascular: Normal rate, regular rhythm, normal heart sounds and intact distal pulses.   No murmur heard. Decreased right dorsalis pedis pulse  No murmur of AI noted  Pulmonary/Chest: Effort normal and breath sounds normal. She has no wheezes. She has no rales.  Abdominal: Soft. Bowel sounds are  normal. She exhibits no distension and no mass. There is no tenderness. There is no rebound and no guarding.  Musculoskeletal: Normal range of motion. She exhibits no edema or tenderness.  Neurological: She is alert and oriented to person, place, and time. She has normal reflexes. No cranial nerve deficit. She exhibits normal muscle tone. Coordination normal.  Skin: Skin is warm and dry. No rash noted.  Psychiatric: She has a normal mood and affect. Her behavior is normal.          Assessment & Plan:   Preventive health examination Subsequent Medicare wellness exam History of essential hypertension.  According to cardiology note, patient was placed on amlodipine as a afterload reducer due to her AI.  Blood pressure readings have been low normal and patient has had an episode of syncope.  Will discontinue low-dose amlodipine and observe off therapy  Recheck 6 months  Nyoka Cowden

## 2016-06-28 NOTE — Patient Instructions (Signed)
Discontinue amlodipine  Limit your sodium (Salt) intake  Please check your blood pressure on a regular basis.  If it is consistently greater than 150/90, please make an office appointment.    It is important that you exercise regularly, at least 20 minutes 3 to 4 times per week.  If you develop chest pain or shortness of breath seek  medical attention.  Take a calcium supplement, plus [id number] units of vitamin D

## 2016-08-23 ENCOUNTER — Telehealth: Payer: Self-pay | Admitting: Internal Medicine

## 2016-08-23 NOTE — Telephone Encounter (Signed)
Pt needs a letter stating she needs porch delivery for her mail. The mailman suggested she have the letter say, Pt is a physically disabled elderly pt who needs porch delivery.  Pt has fallen several times in the yard trying to get up her hill.

## 2016-08-24 NOTE — Telephone Encounter (Signed)
Please advise 

## 2016-08-25 ENCOUNTER — Encounter: Payer: Self-pay | Admitting: Internal Medicine

## 2016-08-25 NOTE — Telephone Encounter (Signed)
Pt made aware, she will stop by the office to pick it up.

## 2016-08-25 NOTE — Telephone Encounter (Signed)
Done.  Please notify patient for pickup or mail.  If patient desires

## 2017-06-13 ENCOUNTER — Encounter: Payer: Self-pay | Admitting: Cardiovascular Disease

## 2017-06-13 ENCOUNTER — Ambulatory Visit: Payer: Medicare HMO | Admitting: Cardiovascular Disease

## 2017-06-13 VITALS — BP 130/72 | HR 88 | Ht 62.0 in | Wt 157.0 lb

## 2017-06-13 DIAGNOSIS — E7800 Pure hypercholesterolemia, unspecified: Secondary | ICD-10-CM

## 2017-06-13 DIAGNOSIS — I351 Nonrheumatic aortic (valve) insufficiency: Secondary | ICD-10-CM

## 2017-06-13 DIAGNOSIS — I10 Essential (primary) hypertension: Secondary | ICD-10-CM | POA: Diagnosis not present

## 2017-06-13 NOTE — Patient Instructions (Signed)

## 2017-06-13 NOTE — Progress Notes (Signed)
06/13/2017 Natalie Fitzgerald   12-14-1927  [id number]  Primary Physician Marletta Lor, MD Primary Cardiologist: Lorretta Harp MD Lupe Carney, [place]  HPI:  Natalie Fitzgerald is a 84 y.o.  moderately overweight widowed Caucasian female mother of 4 children, grandmother of 5 grandchildren self-referred to our [place] office because of convenience and parking issues at the [employer]. Her prior cardiologist was Dr. Cathie Olden. Last saw her in the office  05/12/2016.She has a history of questionable hypertension as well as mild aortic insufficiency by 2-D echo as recently as 01/21/14 with normal LV size and function. She tells me that she was put on low-dose amlodipine because of her aortic insufficiency for "afterload reduction". She has never had a heart attack or stroke. She denies chest pain or shortness of breath.     Current Meds  Medication Sig  . aspirin 81 MG tablet Take 81 mg by mouth daily.    . hydrocortisone 1 % lotion Apply 1 application topically 2 (two) times daily. (Patient taking differently: Apply 1 application topically as needed. )     No Known Allergies  Social History   Socioeconomic History  . Marital status: Married    Spouse name: Not on file  . Number of children: Not on file  . Years of education: Not on file  . Highest education level: Not on file  Occupational History  . Not on file  Social Needs  . Financial resource strain: Not on file  . Food insecurity:    Worry: Not on file    Inability: Not on file  . Transportation needs:    Medical: Not on file    Non-medical: Not on file  Tobacco Use  . Smoking status: Never Smoker  . Smokeless tobacco: Never Used  Substance and Sexual Activity  . Alcohol use: No  . Drug use: No  . Sexual activity: Not on file  Lifestyle  . Physical activity:    Days per week: Not on file    Minutes per session: Not on file  . Stress: Not on file  Relationships  . Social connections:   Talks on phone: Not on file    Gets together: Not on file    Attends religious service: Not on file    Active member of club or organization: Not on file    Attends meetings of clubs or organizations: Not on file    Relationship status: Not on file  . Intimate partner violence:    Fear of current or ex partner: Not on file    Emotionally abused: Not on file    Physically abused: Not on file    Forced sexual activity: Not on file  Other Topics Concern  . Not on file  Social History Narrative  . Not on file     Review of Systems: General: negative for chills, fever, night sweats or weight changes.  Cardiovascular: negative for chest pain, dyspnea on exertion, edema, orthopnea, palpitations, paroxysmal nocturnal dyspnea or shortness of breath Dermatological: negative for rash Respiratory: negative for cough or wheezing Urologic: negative for hematuria Abdominal: negative for nausea, vomiting, diarrhea, bright red blood per rectum, melena, or hematemesis Neurologic: negative for visual changes, syncope, or dizziness All other systems reviewed and are otherwise negative except as noted above.    Blood pressure 130/72, pulse 88, height 5\' 2"  (1.575 m), weight 157 lb (71.2 kg).  General appearance: alert and no distress Neck: no adenopathy, no  carotid bruit, no JVD, supple, symmetrical, trachea midline and thyroid not enlarged, symmetric, no tenderness/mass/nodules Lungs: clear to auscultation bilaterally Heart: regular rate and rhythm, S1, S2 normal, no murmur, click, rub or gallop Extremities: extremities normal, atraumatic, no cyanosis or edema Pulses: 2+ and symmetric Skin: Skin color, texture, turgor normal. No rashes or lesions Neurologic: Alert and oriented X 3, normal strength and tone. Normal symmetric reflexes. Normal coordination and gait  EKG sinus rhythm at 88 without ST or T wave changes.  I personally reviewed this EKG  ASSESSMENT AND PLAN:   Hyperlipemia History  of hyperlipidemia not on statin therapy  Hypertension History of essential hypertension her blood pressure measured today 130/72.  She is on no antihypertensive medications  Aortic insufficiency History of mild aortic insufficiency in the past with echo performed 01/21/2014 revealing normal LV size and function mild AI.      Lorretta Harp MD FACP,FACC,FAHA, [facility] 06/13/2017 2:30 PM

## 2017-06-13 NOTE — Assessment & Plan Note (Signed)
History of hyperlipidemia not on statin therapy. 

## 2017-06-13 NOTE — Assessment & Plan Note (Signed)
History of essential hypertension her blood pressure measured today 130/72.  She is on no antihypertensive medications

## 2017-06-13 NOTE — Assessment & Plan Note (Signed)
History of mild aortic insufficiency in the past with echo performed 01/21/2014 revealing normal LV size and function mild AI.

## 2017-06-25 ENCOUNTER — Encounter: Payer: Self-pay | Admitting: Family Medicine

## 2017-06-25 ENCOUNTER — Ambulatory Visit (INDEPENDENT_AMBULATORY_CARE_PROVIDER_SITE_OTHER): Payer: Medicare HMO | Admitting: Family Medicine

## 2017-06-25 VITALS — BP 130/80 | HR 85 | Temp 98.1°F | Resp 12 | Ht 62.0 in | Wt 157.4 lb

## 2017-06-25 DIAGNOSIS — H6122 Impacted cerumen, left ear: Secondary | ICD-10-CM | POA: Diagnosis not present

## 2017-06-25 DIAGNOSIS — B372 Candidiasis of skin and nail: Secondary | ICD-10-CM

## 2017-06-25 DIAGNOSIS — L299 Pruritus, unspecified: Secondary | ICD-10-CM

## 2017-06-25 DIAGNOSIS — R2681 Unsteadiness on feet: Secondary | ICD-10-CM | POA: Diagnosis not present

## 2017-06-25 DIAGNOSIS — N183 Chronic kidney disease, stage 3 unspecified: Secondary | ICD-10-CM | POA: Insufficient documentation

## 2017-06-25 DIAGNOSIS — I10 Essential (primary) hypertension: Secondary | ICD-10-CM | POA: Diagnosis not present

## 2017-06-25 LAB — BASIC METABOLIC PANEL
BUN: 29 mg/dL — ABNORMAL HIGH (ref 6–23)
CHLORIDE: 104 meq/L (ref 96–112)
CO2: 25 meq/L (ref 19–32)
CREATININE: 1.38 mg/dL — AB (ref 0.40–1.20)
Calcium: 9.5 mg/dL (ref 8.4–10.5)
GFR: 38.4 mL/min — ABNORMAL LOW (ref >60.00)
Glucose, Bld: 113 mg/dL — ABNORMAL HIGH (ref 70–99)
POTASSIUM: 4.9 meq/L (ref 3.5–5.1)
SODIUM: 139 meq/L (ref 135–145)

## 2017-06-25 LAB — MICROALBUMIN / CREATININE URINE RATIO
CREATININE, U: 121.5 mg/dL
MICROALB UR: 1.6 mg/dL (ref 0.0–1.9)
MICROALB/CREAT RATIO: 1.3 mg/g (ref 0.0–30.0)

## 2017-06-25 LAB — VITAMIN D 25 HYDROXY (VIT D DEFICIENCY, FRACTURES): VITD: 14.97 ng/mL — ABNORMAL LOW (ref 30.00–100.00)

## 2017-06-25 MED ORDER — NYSTATIN-TRIAMCINOLONE 100000-0.1 UNIT/GM-% EX CREA: 1.0000 "application " | TOPICAL_CREAM | Freq: Two times a day (BID) | CUTANEOUS | 0 refills | Status: DC | PRN

## 2017-06-25 NOTE — Assessment & Plan Note (Signed)
Most likely related with history of hypertension. Adequate hydration. Low-salt diet. Avoid NSAIDs were recommended.

## 2017-06-25 NOTE — Assessment & Plan Note (Signed)
Well-controlled with nonpharmacologic treatment. Continue low-salt diet. Follow-up in 12 months, before if needed.

## 2017-06-25 NOTE — Assessment & Plan Note (Signed)
Educated about fall precautions. I strongly recommend using a walker instead a cane.

## 2017-06-25 NOTE — Progress Notes (Signed)
HPI:   [name] is a 84 y.o. female, who is here today with her daughter to establish care.  Former PCP: Dr Sharren Bridge Last preventive routine visit: 06/2016.  Chronic medical problems: HTN,aortic valvular disease,HLD, and back pain among some.  She is living with her son. She was living in her house until recently. Moved with son was discharged from rehab,he has had some health issues.  Mild aortic insufficiency: She follows with cardiologist, Dr. Alvester Chou, annually. Currently she is on Aspirin 81 mg daily. No history of CAD or CVD. HTN now on non pharmacologic treatment. She was on Amlodipine for about 6 years.  CKD III: She has not noted gross hematuria or foam in urine.  Concerns today:   She is complaining of a few weeks of "female itching." External genital pruritus, started after wearing pad lighting in a "really hot day." No vaginal discharge or bleeding. No pain or edema. No urinary symptoms. She has applied Peroxide 2%, which helped temporarily. She has not used OTC cream.   -Also complaining of bilateral ear pruritus and left ear fullness sensation. Problem has been intermittently for a while.  No recent URI or travel. She has not used OTC medication, she has used a little stick to remove wax. No fever or chills. She has not identified exacerbating or alleviating factors.  Unstable gait,recent;y fell when she was pulling trash can,unharmed. She has a Insurance underwriter at home but uses a cane most of the time.   Review of Systems  Constitutional: Negative for activity change, appetite change, fatigue and fever.  HENT: Positive for hearing loss. Negative for congestion, ear pain, mouth sores, nosebleeds, rhinorrhea and trouble swallowing.   Eyes: Negative for redness and visual disturbance.  Respiratory: Negative for cough, shortness of breath and wheezing.   Cardiovascular: Negative for chest pain, palpitations and leg swelling.  Gastrointestinal:  Negative for abdominal pain, nausea and vomiting.       Negative for changes in bowel habits.  Genitourinary: Negative for decreased urine volume, dysuria and hematuria.  Musculoskeletal: Positive for arthralgias and gait problem.  Allergic/Immunologic: Positive for environmental allergies.  Neurological: Negative for syncope, weakness and headaches.      Current Outpatient Medications on File Prior to Visit  Medication Sig Dispense Refill  . aspirin 81 MG tablet Take 81 mg by mouth daily.      . hydrocortisone 1 % lotion Apply 1 application topically 2 (two) times daily. (Patient taking differently: Apply 1 application topically as needed. ) 118 mL 0   No current facility-administered medications on file prior to visit.      Past Medical History:  Diagnosis Date  . Aortic valve disorders    moderate aortic insufficiency  . Atypical mole    nose  . Dizziness   . Glaucoma   . Nipple problem    No Known Allergies  Family History  Problem Relation Age of Onset  . Cancer Mother        kidney  . Alcohol abuse Father     Social History   Socioeconomic History  . Marital status: Married    Spouse name: Not on file  . Number of children: Not on file  . Years of education: Not on file  . Highest education level: Not on file  Occupational History  . Not on file  Social Needs  . Financial resource strain: Not on file  . Food insecurity:    Worry: Not on file  Inability: Not on file  . Transportation needs:    Medical: Not on file    Non-medical: Not on file  Tobacco Use  . Smoking status: Never Smoker  . Smokeless tobacco: Never Used  Substance and Sexual Activity  . Alcohol use: No  . Drug use: No  . Sexual activity: Not on file  Lifestyle  . Physical activity:    Days per week: Not on file    Minutes per session: Not on file  . Stress: Not on file  Relationships  . Social connections:    Talks on phone: Not on file    Gets together: Not on file     Attends religious service: Not on file    Active member of club or organization: Not on file    Attends meetings of clubs or organizations: Not on file    Relationship status: Not on file  Other Topics Concern  . Not on file  Social History Narrative  . Not on file    Vitals:   06/25/17 1407  BP: 130/80  Pulse: 85  Resp: 12  Temp: 98.1 F (36.7 C)  SpO2: 98%    Body mass index is 28.78 kg/m.   Physical Exam  Nursing note and vitals reviewed. Constitutional: She is oriented to person, place, and time. She appears well-developed. No distress.  HENT:  Head: Normocephalic and atraumatic.  Right Ear: Tympanic membrane, external ear and ear canal normal.  Mouth/Throat: Oropharynx is clear and moist and mucous membranes are normal.  Left ear with excess cerumen,not able to see TM. Right ear canal no erythema,scaly skin.  Eyes: Conjunctivae are normal. Pupils are unequal.  Right pupil slightly bigger.   Cardiovascular: Normal rate and regular rhythm.  No murmur heard. Pulses:      Dorsalis pedis pulses are 2+ on the right side, and 2+ on the left side.  Respiratory: Effort normal and breath sounds normal. No respiratory distress.  GI: Soft. She exhibits no mass. There is no hepatomegaly. There is no tenderness.  Genitourinary:  Genitourinary Comments: Refused examination, would like deferred to next visit if needed.  Musculoskeletal: She exhibits edema (Trace pitting pedal edema, L>R). She exhibits no tenderness.  Lymphadenopathy:    She has no cervical adenopathy.  Neurological: She is alert and oriented to person, place, and time. She has normal strength. Gait abnormal.  Skin: Skin is warm. No erythema.  Psychiatric: She has a normal mood and affect.  Well groomed, good eye contact.    ASSESSMENT AND PLAN:  Ms. Symia was seen today for transfer care.  Orders Placed This Encounter  Procedures  . Basic metabolic panel  . Microalbumin / creatinine urine ratio  .  VITAMIN D 25 Hydroxy (Vit-D Deficiency, Fractures)   Lab Results  Component Value Date   CREATININE 1.38 (H) 06/25/2017   BUN 29 (H) 06/25/2017   NA 139 06/25/2017   K 4.9 06/25/2017   CL 104 06/25/2017   CO2 25 06/25/2017   Lab Results  Component Value Date   MICROALBUR 1.6 06/25/2017     Impacted cerumen of left ear  Ear discomfort improved greatly after ear lavage,which was performed after verbal consent. Follow-up as needed.  Itching of ear  ? Seborrheic dermatitis. Treatment options discussed. She wants to try warm olive oil for ear pruritus. F/U as needed.  Candidal dermatitis  Based on Hx empiric treatment for candidiasis given. If not better she will le me know. Avoid panty liners. F/U as needed.  -  nystatin-triamcinolone (MYCOLOG II) cream; Apply 1 application topically 2 (two) times daily as needed.   Unstable gait Educated about fall precautions. I strongly recommend using a walker instead a cane.   CKD (chronic kidney disease), stage III ([place]) Most likely related with history of hypertension. Adequate hydration. Low-salt diet. Avoid NSAIDs were recommended.   Hypertension Well-controlled with nonpharmacologic treatment. Continue low-salt diet. Follow-up in 12 months, before if needed.      Hanifa Antonetti G. [place], MD  [facility]. [place] office.

## 2017-06-25 NOTE — Patient Instructions (Signed)
A few things to remember from today's visit:   Impacted cerumen of left ear  Itching of ear  Unstable gait  CKD (chronic kidney disease), stage III ([place]) - Plan: Basic metabolic panel, Microalbumin / creatinine urine ratio, VITAMIN D 25 Hydroxy (Vit-D Deficiency, Fractures)  Fall prevention. Consider using your walker instead your cane. Ask your cardiology if you need to continue taking Aspirin daily. If labs are stable I think I can see you annually.

## 2017-06-27 ENCOUNTER — Encounter: Payer: Self-pay | Admitting: *Deleted

## 2017-11-13 ENCOUNTER — Ambulatory Visit (INDEPENDENT_AMBULATORY_CARE_PROVIDER_SITE_OTHER): Payer: Medicare HMO | Admitting: Family Medicine

## 2017-11-13 ENCOUNTER — Ambulatory Visit (INDEPENDENT_AMBULATORY_CARE_PROVIDER_SITE_OTHER): Payer: Medicare HMO

## 2017-11-13 ENCOUNTER — Encounter: Payer: Self-pay | Admitting: Family Medicine

## 2017-11-13 VITALS — BP 120/60 | HR 79 | Temp 98.1°F | Resp 16 | Ht 62.0 in | Wt 158.0 lb

## 2017-11-13 DIAGNOSIS — S8991XA Unspecified injury of right lower leg, initial encounter: Secondary | ICD-10-CM

## 2017-11-13 DIAGNOSIS — S82831A Other fracture of upper and lower end of right fibula, initial encounter for closed fracture: Secondary | ICD-10-CM | POA: Diagnosis not present

## 2017-11-13 DIAGNOSIS — Y92009 Unspecified place in unspecified non-institutional (private) residence as the place of occurrence of the external cause: Secondary | ICD-10-CM

## 2017-11-13 DIAGNOSIS — R251 Tremor, unspecified: Secondary | ICD-10-CM

## 2017-11-13 DIAGNOSIS — W19XXXA Unspecified fall, initial encounter: Secondary | ICD-10-CM | POA: Diagnosis not present

## 2017-11-13 DIAGNOSIS — S8264XA Nondisplaced fracture of lateral malleolus of right fibula, initial encounter for closed fracture: Secondary | ICD-10-CM | POA: Diagnosis not present

## 2017-11-13 DIAGNOSIS — M79661 Pain in right lower leg: Secondary | ICD-10-CM | POA: Diagnosis not present

## 2017-11-13 LAB — BASIC METABOLIC PANEL
BUN: 26 mg/dL — AB (ref 6–23)
CHLORIDE: 100 meq/L (ref 96–112)
CO2: 23 meq/L (ref 19–32)
CREATININE: 1.25 mg/dL — AB (ref 0.40–1.20)
Calcium: 9.2 mg/dL (ref 8.4–10.5)
GFR: 43 mL/min — ABNORMAL LOW (ref >60.00)
Glucose, Bld: 197 mg/dL — ABNORMAL HIGH (ref 70–99)
POTASSIUM: 4.8 meq/L (ref 3.5–5.1)
SODIUM: 132 meq/L — AB (ref 135–145)

## 2017-11-13 LAB — CBC
HEMATOCRIT: 33.4 % — AB (ref 36.0–46.0)
HEMOGLOBIN: 11.1 g/dL — AB (ref 12.0–15.0)
MCHC: 33.1 g/dL (ref 30.0–36.0)
MCV: 91.7 fl (ref 78.0–100.0)
Platelets: 287 10*3/uL (ref 150.0–400.0)
RBC: 3.64 Mil/uL — ABNORMAL LOW (ref 3.87–5.11)
RDW: 14.3 % (ref 11.5–15.5)
WBC: 9.1 10*3/uL (ref 4.0–10.5)

## 2017-11-13 NOTE — Progress Notes (Signed)
HPI:  Chief Complaint  Patient presents with  . Follow-up  . Right ankle pain    pain and swelling in right ankle, fell 2 weeks ago    [name] is a 84 y.o. female, who is here today with her daughter c/o right ankle pain after fall about 2 weeks ago.   She got up from the couch and collapse, no LOC. She was not "feeling myself."  She had some upper lip numbness before fall. She denies chest pain,dyspnea,palpitation,nausea,vomiting,or focal deficit.  Right ankle pain started a few minutes after fall. + Edema and ecchymosis. Limitation of ROM.  She has not taken OTC analgesic. She is wearing a ankle splint.  Pain is keeping her from sleep.  Pain is severe,exacerbated by movement,she cannot bare wt. Alleviated by rest.   Denies hitting her head. Family helped her get up from floor. She did not seek medical attention.  She is c/o new onset of tremor that started about 2-3 weeks ago. Denies prior Hx. No FHx of tremor. Exacerbated by elevating UE,trying to hold spoon or cup. Alleviated by rest.  She denies having headache or visual changes.  Hx of hypoNa++ Last Na++ 139 (132).  Negative for abdominal pain,changes in bowel habits, or MS changes.  Review of Systems  Constitutional: Positive for fatigue. Negative for appetite change and diaphoresis.  Respiratory: Negative for shortness of breath and wheezing.   Cardiovascular: Positive for leg swelling. Negative for chest pain and palpitations.  Gastrointestinal: Negative for abdominal pain, nausea and vomiting.       No changes in bowel habits.  Genitourinary: Negative for decreased urine volume, dysuria and hematuria.  Musculoskeletal: Positive for arthralgias, gait problem and joint swelling.  Skin: Negative for rash and wound.  Neurological: Positive for tremors. Negative for syncope, weakness, numbness and headaches.  Psychiatric/Behavioral: Positive for sleep disturbance. Negative for  confusion.      Current Outpatient Medications on File Prior to Visit  Medication Sig Dispense Refill  . aspirin 81 MG tablet Take 81 mg by mouth daily.      . hydrocortisone 1 % lotion Apply 1 application topically 2 (two) times daily. (Patient taking differently: Apply 1 application topically as needed. ) 118 mL 0  . nystatin-triamcinolone (MYCOLOG II) cream Apply 1 application topically 2 (two) times daily as needed. 45 g 0   No current facility-administered medications on file prior to visit.      Past Medical History:  Diagnosis Date  . Aortic valve disorders    moderate aortic insufficiency  . Atypical mole    nose  . Dizziness   . Glaucoma   . Nipple problem    No Known Allergies  Social History   Socioeconomic History  . Marital status: Married    Spouse name: Not on file  . Number of children: Not on file  . Years of education: Not on file  . Highest education level: Not on file  Occupational History  . Not on file  Social Needs  . Financial resource strain: Not on file  . Food insecurity:    Worry: Not on file    Inability: Not on file  . Transportation needs:    Medical: Not on file    Non-medical: Not on file  Tobacco Use  . Smoking status: Never Smoker  . Smokeless tobacco: Never Used  Substance and Sexual Activity  . Alcohol use: No  . Drug use: No  . Sexual activity: Not  on file  Lifestyle  . Physical activity:    Days per week: Not on file    Minutes per session: Not on file  . Stress: Not on file  Relationships  . Social connections:    Talks on phone: Not on file    Gets together: Not on file    Attends religious service: Not on file    Active member of club or organization: Not on file    Attends meetings of clubs or organizations: Not on file    Relationship status: Not on file  Other Topics Concern  . Not on file  Social History Narrative  . Not on file    Vitals:   11/13/17 1445  BP: 120/60  Pulse: 79  Resp: 16  Temp:  98.1 F (36.7 C)  SpO2: 97%   Body mass index is 28.9 kg/m.   Physical Exam  Nursing note and vitals reviewed. Constitutional: She is oriented to person, place, and time. She appears well-developed. No distress.  HENT:  Head: Normocephalic and atraumatic.  Mouth/Throat: Oropharynx is clear and moist and mucous membranes are normal.  Eyes: Pupils are equal, round, and reactive to light. Conjunctivae are normal.  Cardiovascular: Normal rate and regular rhythm.  No murmur heard. Pulses:      Dorsalis pedis pulses are 2+ on the right side, and 2+ on the left side.  Respiratory: Effort normal and breath sounds normal. No respiratory distress.  GI: Soft. She exhibits no mass. There is no tenderness.  Musculoskeletal: She exhibits edema (RLE and foot pitting edema, 1+).       Right ankle: She exhibits decreased range of motion and swelling. She exhibits normal pulse. Tenderness. Lateral malleolus and proximal fibula tenderness found. No head of 5th metatarsal tenderness found. Achilles tendon exhibits no pain and no defect.  Lymphadenopathy:    She has no cervical adenopathy.  Neurological: She is alert and oriented to person, place, and time. She has normal strength. No cranial nerve deficit.  Skin: Skin is warm. Ecchymosis noted. No rash noted. No erythema.  Psychiatric: She has a normal mood and affect.  Well groomed, good eye contact.      Medial malleolus.   ASSESSMENT AND PLAN:  [name] was seen today for follow-up and right ankle pain.  Orders Placed This Encounter  Procedures  . DG Ankle Complete Right  . DG Tibia/Fibula Right  . Basic metabolic panel  . CBC    Lab Results  Component Value Date   WBC 9.1 11/13/2017   HGB 11.1 (L) 11/13/2017   HCT 33.4 (L) 11/13/2017   MCV 91.7 11/13/2017   PLT 287.0 11/13/2017   Lab Results  Component Value Date   CREATININE 1.25 (H) 11/13/2017   BUN 26 (H) 11/13/2017   NA 132 (L) 11/13/2017   K 4.8 11/13/2017   CL 100  11/13/2017   CO2 23 11/13/2017    Right leg injury, initial encounter Elevation and local ice. X ray done today showed distal displaced fibular fracture.  -     DG Ankle Complete Right; Future -     DG Tibia/Fibula Right; Future  Closed fracture of distal end of right fibula, unspecified fracture morphology, initial encounter  Referred to acute ortho care that opens at 5 pm tonight. Daughter knows how to get there.  Fall at home, initial encounter ? Presyncopal episode. Fall prevention discussed. Recommend getting up slowly. Adequate hydration.  -     Basic metabolic panel  Tremor,  unspecified  Possible etiologies discussed. ? Essential tremor.  -     Basic metabolic panel -     CBC         G. Martinique, MD  [facility]. [place] office.

## 2017-11-13 NOTE — Patient Instructions (Signed)
A few things to remember from today's visit:   Right leg injury, initial encounter - Plan: DG Ankle Complete Right, DG Tibia/Fibula Right  Tremor, unspecified - Plan: Basic metabolic panel  Fall at home, initial encounter - Plan: Basic metabolic panel   Please be sure medication list is accurate. If a new problem present, please set up appointment sooner than planned today.

## 2017-11-21 ENCOUNTER — Encounter: Payer: Self-pay | Admitting: *Deleted

## 2017-11-29 DIAGNOSIS — S8264XD Nondisplaced fracture of lateral malleolus of right fibula, subsequent encounter for closed fracture with routine healing: Secondary | ICD-10-CM | POA: Diagnosis not present

## 2017-12-20 DIAGNOSIS — S82831A Other fracture of upper and lower end of right fibula, initial encounter for closed fracture: Secondary | ICD-10-CM | POA: Diagnosis not present

## 2018-01-09 DIAGNOSIS — S82831D Other fracture of upper and lower end of right fibula, subsequent encounter for closed fracture with routine healing: Secondary | ICD-10-CM | POA: Diagnosis not present

## 2018-01-10 DIAGNOSIS — S82831D Other fracture of upper and lower end of right fibula, subsequent encounter for closed fracture with routine healing: Secondary | ICD-10-CM | POA: Diagnosis not present

## 2018-02-07 DIAGNOSIS — S8264XA Nondisplaced fracture of lateral malleolus of right fibula, initial encounter for closed fracture: Secondary | ICD-10-CM | POA: Diagnosis not present

## 2018-03-06 ENCOUNTER — Ambulatory Visit (INDEPENDENT_AMBULATORY_CARE_PROVIDER_SITE_OTHER): Payer: Medicare HMO | Admitting: Family Medicine

## 2018-03-06 ENCOUNTER — Encounter: Payer: Self-pay | Admitting: Family Medicine

## 2018-03-06 VITALS — BP 130/70 | HR 87 | Temp 98.0°F | Resp 12 | Ht 62.0 in

## 2018-03-06 DIAGNOSIS — R2681 Unsteadiness on feet: Secondary | ICD-10-CM

## 2018-03-06 DIAGNOSIS — R252 Cramp and spasm: Secondary | ICD-10-CM

## 2018-03-06 DIAGNOSIS — M1712 Unilateral primary osteoarthritis, left knee: Secondary | ICD-10-CM | POA: Diagnosis not present

## 2018-03-06 DIAGNOSIS — R739 Hyperglycemia, unspecified: Secondary | ICD-10-CM | POA: Diagnosis not present

## 2018-03-06 DIAGNOSIS — N183 Chronic kidney disease, stage 3 unspecified: Secondary | ICD-10-CM

## 2018-03-06 DIAGNOSIS — I10 Essential (primary) hypertension: Secondary | ICD-10-CM | POA: Diagnosis not present

## 2018-03-06 NOTE — Patient Instructions (Signed)
A few things to remember from today's visit:   Essential hypertension - Plan: Basic metabolic panel  CKD (chronic kidney disease), stage III (Natalie Fitzgerald) - Plan: Basic metabolic panel, VITAMIN D 25 Hydroxy (Vit-D Deficiency, Fractures)  Unstable gait  Osteoarthritis of left knee, unspecified osteoarthritis type - Plan: Ambulatory referral to Home Health  Hyperglycemia - Plan: Hemoglobin A1c   Continue local heat, extremity elevation above waist level. Tylenol 500 mg 3-4 times per day for pain. Topical icy hot or Aspercreme on affected area.  Low-salt diet. Continue monitoring blood pressure.  Please be sure medication list is accurate. If a new problem present, please set up appointment sooner than planned today.

## 2018-03-06 NOTE — Progress Notes (Addendum)
ACUTE VISIT   HPI:  Chief Complaint  Patient presents with  . Left knee pain    started last night, feels like really bad cramps    Natalie Fitzgerald is a 85 y.o. female, who is here today with her daughter complaining of left knee pain that started yesterday. She denies Hx of trauma or unusual physical activity. Cramp in posterior aspect of left knee.  Intermittent pain,severe this morning 10/10. Pain exacerbated by standing,walking,and certain movements.  Al this time she has no pain.  No recent surgery or long driving/travel.   She also mentions bilateral LE edema. According to daughter she has not noted worsening edema. No erythema or skin lesions.  She took Ibuprofen 600 mg.  Daughter was concerned about "high" BP yesterday and dizziness,she cannot recall reading. She thought it was due dehydration,so pushed oral fluids,symptoms improved. She denies chest pain,palpitations,dyspnea,or diaphoresis.  HTN on non pharmacologic treatment.  CKD III,she has not noted foam in urine or decreased urine output. She is on K+ supplementation,not sure about dose.  Lab Results  Component Value Date   CREATININE 1.25 (H) 11/13/2017   BUN 26 (H) 11/13/2017   NA 132 (L) 11/13/2017   K 4.8 11/13/2017   CL 100 11/13/2017   CO2 23 11/13/2017    -She has had high glucose, 113,197. Denies abdominal pain, nausea,vomiting, polydipsia,polyuria, or polyphagia.   Her daughter is also concerned about unstable gait.It is getting progressively worse. She is using a walker at home. Negative for falls. Hx of knee OA.   Review of Systems  Constitutional: Negative for chills and fever.  Respiratory: Negative for cough, shortness of breath and wheezing.   Cardiovascular: Positive for leg swelling. Negative for chest pain and palpitations.  Gastrointestinal: Negative for nausea and vomiting.  Genitourinary: Negative for decreased urine volume and hematuria.  Musculoskeletal:  Positive for arthralgias, gait problem and joint swelling.  Skin: Negative for color change and rash.  Neurological: Negative for weakness and numbness.     No current outpatient medications on file prior to visit.   No current facility-administered medications on file prior to visit.      Past Medical History:  Diagnosis Date  . Aortic valve disorders    moderate aortic insufficiency  . Atypical mole    nose  . Dizziness   . Glaucoma   . Nipple problem    No Known Allergies  Social History   Socioeconomic History  . Marital status: Married    Spouse name: Not on file  . Number of children: Not on file  . Years of education: Not on file  . Highest education level: Not on file  Occupational History  . Not on file  Social Needs  . Financial resource strain: Not on file  . Food insecurity:    Worry: Not on file    Inability: Not on file  . Transportation needs:    Medical: Not on file    Non-medical: Not on file  Tobacco Use  . Smoking status: Never Smoker  . Smokeless tobacco: Never Used  Substance and Sexual Activity  . Alcohol use: No  . Drug use: No  . Sexual activity: Not on file  Lifestyle  . Physical activity:    Days per week: Not on file    Minutes per session: Not on file  . Stress: Not on file  Relationships  . Social connections:    Talks on phone: Not on file  Gets together: Not on file    Attends religious service: Not on file    Active member of club or organization: Not on file    Attends meetings of clubs or organizations: Not on file    Relationship status: Not on file  Other Topics Concern  . Not on file  Social History Narrative  . Not on file    Vitals:   03/06/18 1152  BP: 130/70  Pulse: 87  Resp: 12  Temp: 98 F (36.7 C)  SpO2: 97%   Body mass index is 28.9 kg/m.   Physical Exam  Nursing note and vitals reviewed. Constitutional: She is oriented to person, place, and time. She appears well-developed. No distress.    HENT:  Head: Normocephalic and atraumatic.  Mouth/Throat: Oropharynx is clear and moist and mucous membranes are normal.  Eyes: Conjunctivae are normal.  Cardiovascular: Normal rate and regular rhythm.  No murmur heard. Pulses:      Dorsalis pedis pulses are 2+ on the right side and 2+ on the left side.  Negative Homan's in,LLE   Respiratory: Effort normal and breath sounds normal. No respiratory distress.  GI: Soft. There is no abdominal tenderness.  Musculoskeletal:        General: Edema (Pedal and LE pitting 2+ edema,bilateral.) present.     Left knee: She exhibits decreased range of motion and effusion. She exhibits no deformity and no erythema. No tenderness found.     Left lower leg: She exhibits no tenderness and no bony tenderness.     Comments: No tenderness upon palpation of affected area,no mass noted in popliteal fossa.  Neurological: She is alert and oriented to person, place, and time. She has normal strength. Gait abnormal.  She is a wheel chair.  Skin: Skin is warm. No rash noted. No erythema.  Psychiatric: She has a normal mood and affect.  Well groomed, good eye contact.    ASSESSMENT AND PLAN:  Ms. Analayah was seen today for left knee pain.  Orders Placed This Encounter  Procedures  . Basic metabolic panel  . Hemoglobin A1c  . VITAMIN D 25 Hydroxy (Vit-D Deficiency, Fractures)  . Ambulatory referral to Home Health     Leg cramp We discussed possible etiologies. No tenderness with palpation and no masses appreciated. Pain could be associated with OA,  ? Electrolyte abnormality.  LE edema seems to be stable and no tenderness or redness. We discussed the options of doing a LE [place] but daughter does not think it is necessary. She does npt want imaging. Clearly instructed about warning signs.  Essential hypertension BP today adequately controlled. Continue low salt diet and monitoring BP.  -     Basic metabolic panel  CKD (chronic kidney disease), stage  III (HCC) Continue low salt diet. Recommend avoiding NSAID's. Increased fluid intake.  -     Basic metabolic panel -     VITAMIN D 25 Hydroxy (Vit-D Deficiency, Fractures)  Unstable gait Fall prevention discussed. PT through [facility] will be arranged.  Osteoarthritis of left knee, unspecified osteoarthritis type Acetaminophen 500 mg tid as needed.  -     Ambulatory referral to Home Health  Hyperglycemia Further recommendations according to lab results. -     Hemoglobin A1c    Return in about 4 months (around 07/05/2018) for f/u.     Moksha Dorgan G. Martinique, MD  [facility]. [place] office.

## 2018-03-13 DIAGNOSIS — R252 Cramp and spasm: Secondary | ICD-10-CM | POA: Diagnosis not present

## 2018-03-13 DIAGNOSIS — I129 Hypertensive chronic kidney disease with stage 1 through stage 4 chronic kidney disease, or unspecified chronic kidney disease: Secondary | ICD-10-CM | POA: Diagnosis not present

## 2018-03-13 DIAGNOSIS — I351 Nonrheumatic aortic (valve) insufficiency: Secondary | ICD-10-CM | POA: Diagnosis not present

## 2018-03-13 DIAGNOSIS — R739 Hyperglycemia, unspecified: Secondary | ICD-10-CM | POA: Diagnosis not present

## 2018-03-13 DIAGNOSIS — Z7982 Long term (current) use of aspirin: Secondary | ICD-10-CM | POA: Diagnosis not present

## 2018-03-13 DIAGNOSIS — N183 Chronic kidney disease, stage 3 (moderate): Secondary | ICD-10-CM | POA: Diagnosis not present

## 2018-03-13 DIAGNOSIS — M1712 Unilateral primary osteoarthritis, left knee: Secondary | ICD-10-CM | POA: Diagnosis not present

## 2018-03-14 ENCOUNTER — Telehealth: Payer: Self-pay | Admitting: Family Medicine

## 2018-03-14 NOTE — Telephone Encounter (Signed)
Copied from [place] [id number]. Topic: Quick Communication - Home Health Verbal Orders >> Mar 14, 2018 10:24 AM Percell Belt A wrote: Caller/Agency:  Shanon Brow with [facility] Callback Number: [phone] Requesting OT/PT/Skilled Nursing/Social Work: need Verbal PT  Frequency: 2 week 4/ balance, fall prevention , transfers

## 2018-03-15 DIAGNOSIS — Z7982 Long term (current) use of aspirin: Secondary | ICD-10-CM | POA: Diagnosis not present

## 2018-03-15 DIAGNOSIS — I351 Nonrheumatic aortic (valve) insufficiency: Secondary | ICD-10-CM | POA: Diagnosis not present

## 2018-03-15 DIAGNOSIS — R739 Hyperglycemia, unspecified: Secondary | ICD-10-CM | POA: Diagnosis not present

## 2018-03-15 DIAGNOSIS — M1712 Unilateral primary osteoarthritis, left knee: Secondary | ICD-10-CM | POA: Diagnosis not present

## 2018-03-15 DIAGNOSIS — N183 Chronic kidney disease, stage 3 (moderate): Secondary | ICD-10-CM | POA: Diagnosis not present

## 2018-03-15 DIAGNOSIS — I129 Hypertensive chronic kidney disease with stage 1 through stage 4 chronic kidney disease, or unspecified chronic kidney disease: Secondary | ICD-10-CM | POA: Diagnosis not present

## 2018-03-15 DIAGNOSIS — R252 Cramp and spasm: Secondary | ICD-10-CM | POA: Diagnosis not present

## 2018-03-15 NOTE — Telephone Encounter (Signed)
Left message for Shanon Brow to return call to clinic for verbal orders.

## 2018-03-15 NOTE — Telephone Encounter (Signed)
Spoke with Shanon Brow and verbal orders as requested.

## 2018-03-18 DIAGNOSIS — Z7982 Long term (current) use of aspirin: Secondary | ICD-10-CM | POA: Diagnosis not present

## 2018-03-18 DIAGNOSIS — I351 Nonrheumatic aortic (valve) insufficiency: Secondary | ICD-10-CM | POA: Diagnosis not present

## 2018-03-18 DIAGNOSIS — R739 Hyperglycemia, unspecified: Secondary | ICD-10-CM | POA: Diagnosis not present

## 2018-03-18 DIAGNOSIS — R252 Cramp and spasm: Secondary | ICD-10-CM | POA: Diagnosis not present

## 2018-03-18 DIAGNOSIS — N183 Chronic kidney disease, stage 3 (moderate): Secondary | ICD-10-CM | POA: Diagnosis not present

## 2018-03-18 DIAGNOSIS — M1712 Unilateral primary osteoarthritis, left knee: Secondary | ICD-10-CM | POA: Diagnosis not present

## 2018-03-18 DIAGNOSIS — I129 Hypertensive chronic kidney disease with stage 1 through stage 4 chronic kidney disease, or unspecified chronic kidney disease: Secondary | ICD-10-CM | POA: Diagnosis not present

## 2018-03-21 DIAGNOSIS — N183 Chronic kidney disease, stage 3 (moderate): Secondary | ICD-10-CM | POA: Diagnosis not present

## 2018-03-21 DIAGNOSIS — Z7982 Long term (current) use of aspirin: Secondary | ICD-10-CM | POA: Diagnosis not present

## 2018-03-21 DIAGNOSIS — R739 Hyperglycemia, unspecified: Secondary | ICD-10-CM | POA: Diagnosis not present

## 2018-03-21 DIAGNOSIS — I129 Hypertensive chronic kidney disease with stage 1 through stage 4 chronic kidney disease, or unspecified chronic kidney disease: Secondary | ICD-10-CM | POA: Diagnosis not present

## 2018-03-21 DIAGNOSIS — I351 Nonrheumatic aortic (valve) insufficiency: Secondary | ICD-10-CM | POA: Diagnosis not present

## 2018-03-21 DIAGNOSIS — R252 Cramp and spasm: Secondary | ICD-10-CM | POA: Diagnosis not present

## 2018-03-21 DIAGNOSIS — M1712 Unilateral primary osteoarthritis, left knee: Secondary | ICD-10-CM | POA: Diagnosis not present

## 2018-03-26 DIAGNOSIS — R739 Hyperglycemia, unspecified: Secondary | ICD-10-CM | POA: Diagnosis not present

## 2018-03-26 DIAGNOSIS — Z7982 Long term (current) use of aspirin: Secondary | ICD-10-CM | POA: Diagnosis not present

## 2018-03-26 DIAGNOSIS — I351 Nonrheumatic aortic (valve) insufficiency: Secondary | ICD-10-CM | POA: Diagnosis not present

## 2018-03-26 DIAGNOSIS — I129 Hypertensive chronic kidney disease with stage 1 through stage 4 chronic kidney disease, or unspecified chronic kidney disease: Secondary | ICD-10-CM | POA: Diagnosis not present

## 2018-03-26 DIAGNOSIS — R252 Cramp and spasm: Secondary | ICD-10-CM | POA: Diagnosis not present

## 2018-03-26 DIAGNOSIS — N183 Chronic kidney disease, stage 3 (moderate): Secondary | ICD-10-CM | POA: Diagnosis not present

## 2018-03-26 DIAGNOSIS — M1712 Unilateral primary osteoarthritis, left knee: Secondary | ICD-10-CM | POA: Diagnosis not present

## 2018-03-29 DIAGNOSIS — N183 Chronic kidney disease, stage 3 (moderate): Secondary | ICD-10-CM | POA: Diagnosis not present

## 2018-03-29 DIAGNOSIS — I129 Hypertensive chronic kidney disease with stage 1 through stage 4 chronic kidney disease, or unspecified chronic kidney disease: Secondary | ICD-10-CM | POA: Diagnosis not present

## 2018-03-29 DIAGNOSIS — R252 Cramp and spasm: Secondary | ICD-10-CM | POA: Diagnosis not present

## 2018-03-29 DIAGNOSIS — I351 Nonrheumatic aortic (valve) insufficiency: Secondary | ICD-10-CM | POA: Diagnosis not present

## 2018-03-29 DIAGNOSIS — M1712 Unilateral primary osteoarthritis, left knee: Secondary | ICD-10-CM | POA: Diagnosis not present

## 2018-03-29 DIAGNOSIS — R739 Hyperglycemia, unspecified: Secondary | ICD-10-CM | POA: Diagnosis not present

## 2018-03-29 DIAGNOSIS — Z7982 Long term (current) use of aspirin: Secondary | ICD-10-CM | POA: Diagnosis not present

## 2018-04-02 DIAGNOSIS — R252 Cramp and spasm: Secondary | ICD-10-CM | POA: Diagnosis not present

## 2018-04-02 DIAGNOSIS — I351 Nonrheumatic aortic (valve) insufficiency: Secondary | ICD-10-CM | POA: Diagnosis not present

## 2018-04-02 DIAGNOSIS — N183 Chronic kidney disease, stage 3 (moderate): Secondary | ICD-10-CM | POA: Diagnosis not present

## 2018-04-02 DIAGNOSIS — I129 Hypertensive chronic kidney disease with stage 1 through stage 4 chronic kidney disease, or unspecified chronic kidney disease: Secondary | ICD-10-CM | POA: Diagnosis not present

## 2018-04-02 DIAGNOSIS — Z7982 Long term (current) use of aspirin: Secondary | ICD-10-CM | POA: Diagnosis not present

## 2018-04-02 DIAGNOSIS — R739 Hyperglycemia, unspecified: Secondary | ICD-10-CM | POA: Diagnosis not present

## 2018-04-02 DIAGNOSIS — M1712 Unilateral primary osteoarthritis, left knee: Secondary | ICD-10-CM | POA: Diagnosis not present

## 2018-04-04 DIAGNOSIS — R252 Cramp and spasm: Secondary | ICD-10-CM | POA: Diagnosis not present

## 2018-04-04 DIAGNOSIS — R739 Hyperglycemia, unspecified: Secondary | ICD-10-CM | POA: Diagnosis not present

## 2018-04-04 DIAGNOSIS — M1712 Unilateral primary osteoarthritis, left knee: Secondary | ICD-10-CM | POA: Diagnosis not present

## 2018-04-04 DIAGNOSIS — I351 Nonrheumatic aortic (valve) insufficiency: Secondary | ICD-10-CM | POA: Diagnosis not present

## 2018-04-04 DIAGNOSIS — N183 Chronic kidney disease, stage 3 (moderate): Secondary | ICD-10-CM | POA: Diagnosis not present

## 2018-04-04 DIAGNOSIS — I129 Hypertensive chronic kidney disease with stage 1 through stage 4 chronic kidney disease, or unspecified chronic kidney disease: Secondary | ICD-10-CM | POA: Diagnosis not present

## 2018-04-04 DIAGNOSIS — Z7982 Long term (current) use of aspirin: Secondary | ICD-10-CM | POA: Diagnosis not present

## 2018-04-11 ENCOUNTER — Telehealth: Payer: Self-pay

## 2018-04-11 NOTE — Telephone Encounter (Signed)
Author phoned pt. to assess interest in scheduling virtual awv. No answer, author left detailed VM asking for return call.   

## 2018-07-25 ENCOUNTER — Telehealth: Payer: Self-pay

## 2018-07-25 ENCOUNTER — Telehealth: Payer: Self-pay | Admitting: Cardiovascular Disease

## 2018-07-25 NOTE — Telephone Encounter (Signed)

## 2018-07-25 NOTE — Telephone Encounter (Signed)
lmtcb to:  -change 7/17 virtual appt to OV   OR  -keep appt type as virtual and switch appt date to a designated virtual day

## 2018-07-26 ENCOUNTER — Encounter: Payer: Self-pay | Admitting: Cardiovascular Disease

## 2018-07-26 ENCOUNTER — Telehealth: Payer: Medicare HMO | Admitting: Cardiovascular Disease

## 2018-07-26 NOTE — Telephone Encounter (Signed)
Left message for patient to call and reschedule 07/26/18 virtualvisit with Dr. Gwenlyn Found.  Please schedule on designated half day (virtual visit)

## 2018-07-30 ENCOUNTER — Telehealth: Payer: Self-pay | Admitting: Cardiovascular Disease

## 2018-07-30 ENCOUNTER — Telehealth: Payer: Self-pay

## 2018-07-30 ENCOUNTER — Telehealth (INDEPENDENT_AMBULATORY_CARE_PROVIDER_SITE_OTHER): Payer: Medicare HMO | Admitting: Cardiovascular Disease

## 2018-07-30 DIAGNOSIS — I10 Essential (primary) hypertension: Secondary | ICD-10-CM

## 2018-07-30 DIAGNOSIS — I351 Nonrheumatic aortic (valve) insufficiency: Secondary | ICD-10-CM

## 2018-07-30 NOTE — Patient Instructions (Signed)
Medication Instructions:  Your physician recommends that you continue on your current medications as directed. Please refer to the Current Medication list given to you today.  If you need a refill on your cardiac medications before your next appointment, please call your pharmacy.   Lab work: NONE If you have labs (blood work) drawn today and your tests are completely normal, you will receive your results only by: Marland Kitchen MyChart Message (if you have MyChart) OR . A paper copy in the mail If you have any lab test that is abnormal or we need to change your treatment, we will call you to review the results.  Testing/Procedures: NONE  Follow-Up: At [facility], you and your health needs are our priority.  As part of our continuing mission to provide you with exceptional heart care, we have created designated Provider Care Teams.  These Care Teams include your primary Cardiologist (physician) and Advanced Practice Providers (APPs -  Physician Assistants and Nurse Practitioners) who all work together to provide you with the care you need, when you need it. . You may schedule a follow up appointment as needed. You may see Dr. Gwenlyn Found or one of the following Advanced Practice Providers on your designated Care Team:   . Kerin Ransom, [place] . Almyra Deforest, PA-C . Fabian Sharp, PA-C . Jory Sims, DNP . Rosaria Ferries, PA-C . Roby Lofts, PA-C

## 2018-07-30 NOTE — Telephone Encounter (Signed)
LVM, reminding  Pt of her appt on [id number] with Dr Gwenlyn Found.

## 2018-07-30 NOTE — Progress Notes (Signed)
Virtual Visit via Telephone Note   This visit type was conducted due to national recommendations for restrictions regarding the COVID-19 Pandemic (e.g. social distancing) in an effort to limit this patient's exposure and mitigate transmission in our community.  Due to her co-morbid illnesses, this patient is at least at moderate risk for complications without adequate follow up.  This format is felt to be most appropriate for this patient at this time.  The patient did not have access to video technology/had technical difficulties with video requiring transitioning to audio format only (telephone).  All issues noted in this document were discussed and addressed.  No physical exam could be performed with this format.  Please refer to the patient's chart for her  consent to telehealth for [facility].   Date:  07/30/2018   ID:  Natalie Fitzgerald, DOB 06-14-1927, MRN [id number]  Patient Location: Home Provider Location: Home  PCP:  Martinique, Betty G, MD  Cardiologist: Dr. Quay Burow Electrophysiologist:  None   Evaluation Performed:  Follow-Up Visit  Chief Complaint: Aortic insufficiency and hypertension  History of Present Illness:    Natalie Fitzgerald is a 85y.o.  moderately overweight widowed Caucasian female mother of 4 children, grandmother of 5 grandchildren self-referred to our [place] office because of convenience and parking issues at the [employer]. Her prior cardiologist was Dr. Cathie Olden. Last saw her in the office  06/13/2017.She has a history of questionable hypertension as well as mild aortic insufficiency by 2-D echo as recently as 01/21/14 with normal LV size and function. She tells me that she was put on low-dose amlodipine because of her aortic insufficiency for "afterload reduction". She has never had a heart attack or stroke. She denies chest pain or shortness of breath.  Since I saw her in the office a year ago she is remained stable.  Her daughter is present  during the interview.  She denies chest pain or shortness of breath.  Blood pressures been under good control.   The patient does not have symptoms concerning for COVID-19 infection (fever, chills, cough, or new shortness of breath).    Past Medical History:  Diagnosis Date  . Aortic valve disorders    moderate aortic insufficiency  . Atypical mole    nose  . Dizziness   . Glaucoma   . Nipple problem    Past Surgical History:  Procedure Laterality Date  . APPENDECTOMY    . BACK SURGERY       No outpatient medications have been marked as taking for the 07/30/18 encounter (Appointment) with Lorretta Harp, MD.     Allergies:   Patient has no known allergies.   Social History   Tobacco Use  . Smoking status: Never Smoker  . Smokeless tobacco: Never Used  Substance Use Topics  . Alcohol use: No  . Drug use: No     Family Hx: The patient's family history includes Alcohol abuse in her father; Cancer in her mother.  ROS:   Please see the history of present illness.     All other systems reviewed and are negative.   Prior CV studies:   The following studies were reviewed today:  None  Labs/Other Tests and Data Reviewed:    EKG:  No ECG reviewed.  Recent Labs: 11/13/2017: BUN 26; Creatinine, Ser 1.25; Hemoglobin 11.1; Platelets 287.0; Potassium 4.8; Sodium 132   Recent Lipid Panel Lab Results  Component Value Date/Time   CHOL 182 01/22/2014 10:16 AM  TRIG 126.0 01/22/2014 10:16 AM   HDL 47.20 01/22/2014 10:16 AM   CHOLHDL 4 01/22/2014 10:16 AM   LDLCALC 110 (H) 01/22/2014 10:16 AM   LDLDIRECT 130.2 12/23/2012 04:17 PM    Wt Readings from Last 3 Encounters:  07/26/18 155 lb (70.3 kg)  11/13/17 158 lb (71.7 kg)  06/25/17 157 lb 6 oz (71.4 kg)     Objective:    Vital Signs:  There were no vitals taken for this visit.   VITAL SIGNS:  reviewed a complete physical exam was not performed since this was a virtual telemedicine phone visit  ASSESSMENT  & PLAN:    1. Aortic insufficiency- mild by last echo 01/21/2014  COVID-19 Education: The signs and symptoms of COVID-19 were discussed with the patient and how to seek care for testing (follow up with PCP or arrange E-visit).  The importance of social distancing was discussed today.  Time:   Today, I have spent 5 minutes with the patient with telehealth technology discussing the above problems.     Medication Adjustments/Labs and Tests Ordered: Current medicines are reviewed at length with the patient today.  Concerns regarding medicines are outlined above.   Tests Ordered: No orders of the defined types were placed in this encounter.   Medication Changes: No orders of the defined types were placed in this encounter.   Follow Up:  In Person prn  Signed, Quay Burow, MD  07/30/2018 7:57 AM    [place]

## 2018-07-30 NOTE — Telephone Encounter (Signed)
Left detailed message per DPR, regarding pt 7/21 AVS and stated AVS to be mailed to pt address on file  Letter including After Visit Summary and any other necessary documents to be mailed to the patient's address on file.

## 2018-12-09 ENCOUNTER — Telehealth: Payer: Self-pay | Admitting: *Deleted

## 2018-12-09 ENCOUNTER — Telehealth (INDEPENDENT_AMBULATORY_CARE_PROVIDER_SITE_OTHER): Payer: Medicare HMO | Admitting: Family Medicine

## 2018-12-09 ENCOUNTER — Other Ambulatory Visit: Payer: Self-pay

## 2018-12-09 DIAGNOSIS — I10 Essential (primary) hypertension: Secondary | ICD-10-CM | POA: Diagnosis not present

## 2018-12-09 DIAGNOSIS — R410 Disorientation, unspecified: Secondary | ICD-10-CM

## 2018-12-09 DIAGNOSIS — N1832 Chronic kidney disease, stage 3b: Secondary | ICD-10-CM

## 2018-12-09 DIAGNOSIS — E559 Vitamin D deficiency, unspecified: Secondary | ICD-10-CM | POA: Diagnosis not present

## 2018-12-09 MED ORDER — SULFAMETHOXAZOLE-TRIMETHOPRIM 800-160 MG PO TABS: 1.0000 | ORAL_TABLET | Freq: Two times a day (BID) | ORAL | 0 refills | Status: AC

## 2018-12-09 NOTE — Telephone Encounter (Signed)
Patient has an appointment scheduled. Nothing further needed.

## 2018-12-09 NOTE — Progress Notes (Signed)
Virtual Visit via Telephone Note  I connected with Natalie Fitzgerald and her daughter on 12/09/18 at  4:00 PM EST by telephone and verified that I am speaking with the correct person using two identifiers.   I discussed the limitations, risks, security and privacy concerns of performing an evaluation and management service by telephone and the availability of in person appointments. I also discussed with the patient that there may be a patient responsible charge related to this service. The patient expressed understanding and agreed to proceed.  Location patient: home Location provider: work office Participants present for the call: patient, provider Patient did not have a visit in the prior 7 days to address this/these issue(s).   History of Present Illness: Natalie Fitzgerald is a 85 yo female with hx of hypertension, aortic insufficiency, CKD 3, hyperlipidemia, and chronic back pain whose daughter is concerned about being "more confused than usual" for the past 2 days. Her daughter thinks she may have a UTI. She got a OTC UTI kit, which suggested the presence of UTI. Decreased oral intake,including fluids and she is sleeping more than usual.  Today she is drinking more fluids. Negative for fever, chills, sore throat, chest pain, dyspnea, palpitations, abdominal pain, dysuria, hematuria, increased urinary frequency, or urinary urgency. According to her daughter, urine is clear and without odor.   Her daughter also mentions that she has not showered in 2 weeks. She denies depression.  Unstable gait,which is not new and has been unchanged, no falls recently. HTN, she is not checking BP at home. She is on non pharmacologic treatment.  Lab Results  Component Value Date   CREATININE 1.25 (H) 11/13/2017   BUN 26 (H) 11/13/2017   NA 132 (L) 11/13/2017   K 4.8 11/13/2017   CL 100 11/13/2017   CO2 23 11/13/2017    + CKD III, she has not noted decreased urine output,foam in urine,or edema. Vit  D deficiency, she is on Vit D supplementation, ? U  Yesterday she had an episode of loose stool,no associated N/V or blood in stool. Negative for sick contact.    Observations/Objective: Patient sounds cheerful and well on the phone. I do not appreciate any SOB,cough,or stridor. Speech and thought processing are grossly intact. Patient reported vitals:N/A  Assessment and Plan:  1. Episodic confusion Improved today, so I do not think ER evaluation is necessary at this time. Possible etiologies discussed. Hx does not suggest UTI but given her age is to be considered for differential Dx's. Because time she could not bring urine. So empiric abx treatment with Bactrim recommended. Side effects of abx discussed. Instructed about warning signs.  2. Essential hypertension Recommend monitoring BP regularly. Continue low salt diet. Continue non pharmacologic treatment.  - Basic Metabolic Panel; Future - CBC; Future  3. Stage 3b chronic kidney disease Adequate hydration. Avoid NSAID's.  - Microalbumin / creatinine urine ratio; Future  4. Vitamin D deficiency, unspecified Continue current management.  - VITAMIN D 25 Hydroxy (Vit-D Deficiency, Fractures); Future  Follow Up Instructions:  Return if symptoms worsen or fail to improve, for Lab appt within the next 3-4 weeks.Thanks.   I did not refer this patient for an OV in the next 24 hours for this/these issue(s).  I discussed the assessment and treatment plan with the patient. She and her daughter provided an opportunity to ask questions and all were answered. They agreed with the plan and demonstrated an understanding of the instructions.   The patient was advised to  call back or seek an in-person evaluation if the symptoms worsen or if the condition fails to improve as anticipated.  I provided 15 minutes of non-face-to-face time during this encounter.   Betty Martinique, MD

## 2018-12-09 NOTE — Telephone Encounter (Signed)
Copied from [place] [id number]. Topic: General - Other >> Dec 09, 2018  7:19 AM Carolyn Stare wrote: Pt daughter call to say pt has a UTI she confirmed it with test strips from [facility], she is asking if something can be called in  [place]

## 2018-12-09 NOTE — Telephone Encounter (Signed)
ATC. Unable to leave a voicemail. CRM created.

## 2018-12-09 NOTE — Telephone Encounter (Signed)
Please arrange appt. Thanks, Natalie Fitzgerald

## 2018-12-13 DIAGNOSIS — R531 Weakness: Secondary | ICD-10-CM | POA: Diagnosis not present

## 2018-12-13 DIAGNOSIS — R55 Syncope and collapse: Secondary | ICD-10-CM | POA: Diagnosis not present

## 2018-12-13 DIAGNOSIS — D649 Anemia, unspecified: Secondary | ICD-10-CM | POA: Diagnosis not present

## 2018-12-13 DIAGNOSIS — R000 Tachycardia, unspecified: Secondary | ICD-10-CM | POA: Diagnosis not present

## 2018-12-13 DIAGNOSIS — R42 Dizziness and giddiness: Secondary | ICD-10-CM | POA: Diagnosis not present

## 2018-12-13 DIAGNOSIS — I10 Essential (primary) hypertension: Secondary | ICD-10-CM | POA: Diagnosis not present

## 2018-12-13 DIAGNOSIS — Z743 Need for continuous supervision: Secondary | ICD-10-CM | POA: Diagnosis not present

## 2018-12-13 DIAGNOSIS — I959 Hypotension, unspecified: Secondary | ICD-10-CM | POA: Diagnosis not present

## 2018-12-15 DIAGNOSIS — R55 Syncope and collapse: Secondary | ICD-10-CM | POA: Diagnosis not present

## 2018-12-24 ENCOUNTER — Telehealth (INDEPENDENT_AMBULATORY_CARE_PROVIDER_SITE_OTHER): Payer: Medicare HMO | Admitting: Family Medicine

## 2018-12-24 ENCOUNTER — Encounter: Payer: Self-pay | Admitting: Family Medicine

## 2018-12-24 VITALS — BP 172/78 | HR 65 | Ht 64.0 in

## 2018-12-24 DIAGNOSIS — N1832 Chronic kidney disease, stage 3b: Secondary | ICD-10-CM | POA: Diagnosis not present

## 2018-12-24 DIAGNOSIS — R410 Disorientation, unspecified: Secondary | ICD-10-CM | POA: Diagnosis not present

## 2018-12-24 DIAGNOSIS — Z7189 Other specified counseling: Secondary | ICD-10-CM | POA: Diagnosis not present

## 2018-12-24 DIAGNOSIS — I10 Essential (primary) hypertension: Secondary | ICD-10-CM | POA: Diagnosis not present

## 2018-12-24 DIAGNOSIS — D649 Anemia, unspecified: Secondary | ICD-10-CM | POA: Diagnosis not present

## 2018-12-24 DIAGNOSIS — R5381 Other malaise: Secondary | ICD-10-CM | POA: Diagnosis not present

## 2018-12-24 DIAGNOSIS — Z66 Do not resuscitate: Secondary | ICD-10-CM | POA: Diagnosis not present

## 2018-12-24 MED ORDER — AMLODIPINE BESYLATE 2.5 MG PO TABS: 2.5000 mg | ORAL_TABLET | Freq: Every day | ORAL | 1 refills | Status: DC

## 2018-12-24 NOTE — Progress Notes (Signed)
Virtual Visit via Video Note   I connected with Natalie Mukhopadhyay and her daughter on 12/24/18 by a video enabled telemedicine application and verified that I am speaking with the correct person using two identifiers.  Location patient: home Location provider:work office Persons participating in the virtual visit: patient, provider  I discussed the limitations of evaluation and management by telemedicine and the availability of in person appointments. The patient expressed understanding and agreed to proceed.   HPI: Natalie Fitzgerald is a 85 yo female with history of hypertension, aortic insufficiency, CKD 3, and unstable gait who was in the ER recently. Evaluated in the ER on 12/13/2018 because of loss of consciousness.  During ER visit BP was elevated at 182/70. She has not had any other episodes of syncope since last evaluated in the ER. UA negative for nitrates, protein, glucose, ketones, and leukocytes. Rare urine bacteria and mucus, hyaline cast 10.  CBC and Differential (12/13/2018 11:15 PM EST) CBC and Differential (12/13/2018 11:15 PM EST)  Component Value Ref Range Performed At Pathologist Signature  WBC 10.8 4.4 - 11.0 x 10*3/uL [facility]   RBC 3.14 (L) 4.10 - 5.10 x 10*6/uL [facility]   Hemoglobin 8.2 (L) 12.3 - 15.3 G/DL [facility]   Hematocrit 25.3 (L) 35.9 - 44.6 % [facility]   MCV 80.8 80.0 - 96.0 FL [facility]   MCH 26.2 (L) 27.5 - 33.2 PG [facility]   MCHC 32.4 (L) 33.0 - 37.0 G/DL [facility]   RDW 15.9 12.3 - 17.0 % [facility]   Platelets 326 150 - 450 X 10*3/uL [facility]   MPV 7.7 6.8 - 10.2 FL [facility]   Neutrophil % 77 % [facility]   Lymphocyte % 13 % [facility]   Monocyte % 7 % [facility]   Eosinophil % 3 % [facility]   Basophil % 1 % [facility]   Neutrophil Absolute 8.4 (H) 1.8 - 7.8 x 10*3/uL [facility]   Lymphocyte Absolute 1.4 1.0 - 4.8 x 10*3/uL [facility]   Monocyte Absolute 0.7 0.0 - 0.8 x 10*3/uL [facility]   Eosinophil Absolute 0.3 0.0 - 0.5 x 10*3/uL [facility]   Basophil Absolute 0.1 0.0 - 0.2 x 10*3/uL [facility]     Hypertension: Currently she is on nonpharmacologic treatment. She is not having headache, visual changes, chest pain, dyspnea, palpitations, abdominal pain, worsening edema, or focal deficit.  Home BP readings: 168/80.173/77,147/78, and 187/85. HR's high 60's and low 70's.  Comprehensive Metabolic PanelResulted: [id number] 11:57 PM [facility] Component Name Value Ref Range  Sodium 132 (L) 135 - 146 MMOL/L  Potassium 3.7 3.5 - 5.3 MMOL/L  Chloride 104 98 - 110 MMOL/L  CO2 21 (L) 23 - 30 MMOL/L  BUN 16 8 - 24 MG/DL  Glucose 123 (H)  Comment: Patients taking eltrombopag at doses >/= 100 mg daily may show falsely elevated values of 10% or greater. 70 - 99 MG/DL  Creatinine 1.27 0.5 - 1.5 MG/DL  Calcium 8.3 (L) 8.5 - 10.5 MG/DL  Total Protein 6.5  Comment: Patients taking eltrombopag at doses >/= 100 mg daily may show falsely elevated values of 10% or greater. 6 - 8.3 G/DL  Albumin  3.1 (  L) 3.5 - 5 G/DL  Total Bilirubin 0.2  Comment: Patients taking eltrombopag at doses >/= 100 mg daily may show falsely elevated values of 10% or greater. 0.1 - 1.2 MG/DL  Alkaline Phosphatase 81 25 - 125 IU/L or U/L  AST (SGOT) 21 5 - 40 IU/L or U/L  ALT (SGPT) 9 5 - 50 IU/L or U/L  Anion Gap 7 4 - 14 MMOL/L  Est. GFR Non-African American 38 (L)  Comment: GFR estimated by CKD-EPI equations, reportable up to 90 ML/MIN/1.73 M*2 >=60 ML/MIN/1.73 M*2   Specimen Collected on  Blood 12/13/2018 11:15 PM   CXR 12/13/18: No active disease. EKG: SR,otherwise normal EKG.  Lower extremity edema has  improved. Negative for orthopnea and PND.  Daughter is concerned about Natalie Stuckwisch not being able to stand up and walk with her walker as she did before. She is in bed most of the time. Decreased appetite. Daughter reports "body disorientation", not knowing in what position she is in bed. Intermittent episodes of confusion, seem to be worse at night.  According to the daughter, she has had episodes of confusion at night for a while but it seems to be getting worse.  Last bowel movement 2 days ago, mildly loose, no blood.  Negative for nausea, vomiting, dysuria, gross hematuria, or increase in urine frequency. She has history of urine incontinence.  She has not had fever, chills, sore throat, cough, wheezing, or a skin rash. She denies feeling depressed or anxious.  Daughter has Hx of chronic pain/generalized OA and is having a hard time helping her mother with ADL's.  ROS: See pertinent positives and negatives per HPI.  Past Medical History:  Diagnosis Date  . Aortic valve disorders    moderate aortic insufficiency  . Atypical mole    nose  . Dizziness   . Glaucoma   . Nipple problem     Past Surgical History:  Procedure Laterality Date  . APPENDECTOMY    . BACK SURGERY      Family History  Problem Relation Age of Onset  . Cancer Mother        kidney  . Alcohol abuse Father     Social History   Socioeconomic History  . Marital status: Married    Spouse name: Not on file  . Number of children: Not on file  . Years of education: Not on file  . Highest education level: Not on file  Occupational History  . Not on file  Tobacco Use  . Smoking status: Never Smoker  . Smokeless tobacco: Never Used  Substance and Sexual Activity  . Alcohol use: No  . Drug use: No  . Sexual activity: Not on file  Other Topics Concern  . Not on file  Social History Narrative  . Not on file   Social Determinants of Health   Financial Resource Strain:   . Difficulty of Paying  Living Expenses: Not on file  Food Insecurity:   . Worried About Charity fundraiser in the Last Year: Not on file  . Ran Out of Food in the Last Year: Not on file  Transportation Needs:   . Lack of Transportation (Medical): Not on file  . Lack of Transportation (Non-Medical): Not on file  Physical Activity:   . Days of Exercise per Week: Not on file  . Minutes of Exercise per Session: Not on file  Stress:   . Feeling of Stress : Not on file  Social Connections:   .  Frequency of Communication with Friends and Family: Not on file  . Frequency of Social Gatherings with Friends and Family: Not on file  . Attends Religious Services: Not on file  . Active Member of Clubs or Organizations: Not on file  . Attends Archivist Meetings: Not on file  . Marital Status: Not on file  Intimate Partner Violence:   . Fear of Current or Ex-Partner: Not on file  . Emotionally Abused: Not on file  . Physically Abused: Not on file  . Sexually Abused: Not on file   Current Outpatient Medications:  .  Ascorbic Acid (VITAMIN C PO), Take by mouth., Disp: , Rfl:  .  aspirin EC 81 MG tablet, Take 81 mg by mouth daily., Disp: , Rfl:  .  Multiple Vitamins-Minerals (MULTIVITAL PO), Take by mouth., Disp: , Rfl:  .  VITAMIN D PO, Take by mouth., Disp: , Rfl:  .  amLODipine (NORVASC) 2.5 MG tablet, Take 1 tablet (2.5 mg total) by mouth daily., Disp: 90 tablet, Rfl: 1  EXAM:  VITALS per patient if applicable:BP (!) [id number]   Pulse 65   Ht 5\' 4"  (1.626 m)   BMI 26.78 kg/m   GENERAL: alert, oriented, appears well and in no acute distress. She is in bed and seem to be conformable.  HEENT: atraumatic, conjunctiva clear, no obvious abnormalities on inspection.  NECK: normal movements of the head and neck  LUNGS: on inspection no signs of respiratory distress, breathing rate appears normal, no obvious gross SOB, gasping or wheezing  CV: no obvious cyanosis  Natalie: moves all visible extremities  without noticeable abnormality  PSYCH/NEURO: pleasant and cooperative, no obvious depression or anxiety, speech and thought processing grossly intact. Oriented in place and person. She doe snot remember date,month or year (she thinks it is 32)  ASSESSMENT AND PLAN:  Discussed the following assessment and plan:  Essential hypertension - Plan: amLODipine (NORVASC) 2.5 MG tablet, Ambulatory referral to [place] 3b chronic kidney disease  Confusion with non-focal neuro exam - Plan: Ambulatory referral to [place] Did not improve with empiric treatment for UTI. ?  Dementia. She is not interested in further work-up and I agree.  Declining functional status - Plan: Ambulatory referral to [place] recommend daughter to let her rest. She needs help with ADL's. Fall precautions and decubitus ulcers prevention also discussed.  Anemia, unspecified type We discussed possible etiologies, including chronic blood loss and CKD. I am not recommending further work-up at this time.  Do not resuscitate discussion Long discussion about end-of-life planning. She does not want to have further testing at this time, she wants to be comfortable and so her daughter. Hospice evaluation is being arranged.  DNR no code (do not resuscitate)   41 min face to face OV. > 50% was dedicated to discussion of Dx's,discussed labs, prognosis, coordination of care. I discussed the assessment and treatment plan with the patient. She and her daughter were provided an opportunity to ask questions and all were answered.  They agreed with the plan and demonstrated an understanding of the instructions.    Return in about 3 months (around 03/24/2019).    Jahziah Simonin Martinique, MD

## 2018-12-25 ENCOUNTER — Telehealth: Payer: Self-pay

## 2018-12-25 NOTE — Telephone Encounter (Signed)
Please advise if you will be pt's attending physician.

## 2018-12-25 NOTE — Telephone Encounter (Signed)
Copied from [place] [id number]. Topic: General - Inquiry >> Dec 25, 2018 11:23 AM Virl Axe D wrote: Reason for CRM: Amber with [facility] called to confirm Dr. Martinique would be pt's attending physician. Requesting CB. Please advise.

## 2018-12-26 NOTE — Telephone Encounter (Signed)
Amber with [facility] called for an update on whether Dr.Jordan will be the attending physician. Amber stated they can not get patient scheduled until they have confirmation that Dr. Martinique will be attending physician

## 2018-12-26 NOTE — Telephone Encounter (Signed)
Please advise 

## 2018-12-26 NOTE — Telephone Encounter (Signed)
Museum/gallery conservator with [employer] [phone]

## 2018-12-27 NOTE — Telephone Encounter (Signed)
Amber returned call. Amber notified that Ritchie agreed to be the attending physician. No further action needed.

## 2018-12-27 NOTE — Telephone Encounter (Signed)
Please advise if you will be pt's attending physician.

## 2018-12-27 NOTE — Telephone Encounter (Signed)
Lmovm for Amber to return my call.

## 2018-12-27 NOTE — Telephone Encounter (Signed)
That is fine. °Thanks, °BJ °

## 2018-12-31 ENCOUNTER — Telehealth: Payer: Self-pay | Admitting: Internal Medicine

## 2018-12-31 NOTE — Telephone Encounter (Signed)
Phone call placed to patient to offer to schedule a visit with Palliative Care. Phone rang, with no answer I left a voicemail for call back. 

## 2019-01-14 ENCOUNTER — Telehealth: Payer: Self-pay | Admitting: *Deleted

## 2019-01-14 NOTE — Telephone Encounter (Signed)
Copied from [place] [id number]. Topic: General - Other >> Jan 14, 2019 11:17 AM Celene Kras wrote: Reason for CRM: Pts daughter called stating that palliative care is making a visit to the pts home on 01/17/19. Pts daughter states however that palliative care does not have a diagnosis of why she is needing the services. Pts daughter is worried pt wont be able to be seen because they have not received a diagnosis. Please advise.

## 2019-01-15 NOTE — Telephone Encounter (Signed)
I called and spoke with Hospice/palliative care. Diagnosis given and they will add to her visit for Friday.  Pt's daughter notified.

## 2019-01-15 NOTE — Telephone Encounter (Signed)
The diagnosis I used were declining in functional status and episodic MS changes. I think these diagnosis are enough for initial evaluation. Palliative team will make more recommendations and determine the need of service according to findings on evaluation.  Thanks, BJ

## 2019-01-17 ENCOUNTER — Other Ambulatory Visit: Payer: Self-pay

## 2019-01-17 ENCOUNTER — Other Ambulatory Visit: Payer: Medicare HMO | Admitting: Internal Medicine

## 2019-01-17 NOTE — Progress Notes (Unsigned)
    [facility] Consult Note Telephone: [phone]  Fax: [phone]  PATIENT NAME: Natalie Fitzgerald DOB: 07/23/1927 MRN: [id number]  PRIMARY CARE PROVIDER:   Martinique, Betty G, MD  REFERRING PROVIDER:  Martinique, Betty G, MD [place],  [place] [address]  RESPONSIBLE PARTY:     ASSESSMENT:        RECOMMENDATIONS and PLAN:  1.  I spent *** minutes providing this consultation,  from *** to ***. More than 50% of the time in this consultation was spent coordinating communication.   HISTORY OF PRESENT ILLNESS:  Natalie Fitzgerald is a 86 y.o. year old female with multiple medical problems including ***. Palliative Care was asked to help address goals of care.   CODE STATUS:   PPS: 0% HOSPICE ELIGIBILITY/DIAGNOSIS: TBD  PAST MEDICAL HISTORY:  Past Medical History:  Diagnosis Date  . Aortic valve disorders    moderate aortic insufficiency  . Atypical mole    nose  . Dizziness   . Glaucoma   . Nipple problem     SOCIAL HX:  Social History   Tobacco Use  . Smoking status: Never Smoker  . Smokeless tobacco: Never Used  Substance Use Topics  . Alcohol use: No    ALLERGIES: No Known Allergies   PERTINENT MEDICATIONS:  Outpatient Encounter Medications as of 01/17/2019  Medication Sig  . amLODipine (NORVASC) 2.5 MG tablet Take 1 tablet (2.5 mg total) by mouth daily.  . Ascorbic Acid (VITAMIN C PO) Take by mouth.  Marland Kitchen aspirin EC 81 MG tablet Take 81 mg by mouth daily.  . Multiple Vitamins-Minerals (MULTIVITAL PO) Take by mouth.  Marland Kitchen VITAMIN D PO Take by mouth.   No facility-administered encounter medications on file as of 01/17/2019.    PHYSICAL EXAM:   General: NAD, frail appearing, thin Cardiovascular: regular rate and rhythm Pulmonary: clear ant fields Abdomen: soft, nontender, + bowel sounds GU: no suprapubic tenderness Extremities: no edema, no joint deformities Skin: no rashes Neurological: Weakness but  otherwise nonfocal  Gonzella Lex, NP

## 2019-01-22 ENCOUNTER — Other Ambulatory Visit: Payer: Self-pay

## 2019-01-22 ENCOUNTER — Inpatient Hospital Stay (HOSPITAL_COMMUNITY)
Admission: EM | Admit: 2019-01-22 | Discharge: 2019-01-28 | DRG: 309 | Disposition: A | Discharge status: Skilled Nursing Facility | Payer: Medicare HMO | Attending: Internal Medicine | Admitting: Internal Medicine

## 2019-01-22 ENCOUNTER — Observation Stay (HOSPITAL_COMMUNITY): Payer: Medicare HMO

## 2019-01-22 ENCOUNTER — Encounter (HOSPITAL_COMMUNITY): Payer: Self-pay | Admitting: Internal Medicine

## 2019-01-22 ENCOUNTER — Emergency Department (HOSPITAL_COMMUNITY): Payer: Medicare HMO

## 2019-01-22 DIAGNOSIS — I351 Nonrheumatic aortic (valve) insufficiency: Secondary | ICD-10-CM | POA: Diagnosis present

## 2019-01-22 DIAGNOSIS — I10 Essential (primary) hypertension: Secondary | ICD-10-CM | POA: Diagnosis not present

## 2019-01-22 DIAGNOSIS — R2689 Other abnormalities of gait and mobility: Secondary | ICD-10-CM | POA: Diagnosis not present

## 2019-01-22 DIAGNOSIS — Z79899 Other long term (current) drug therapy: Secondary | ICD-10-CM | POA: Diagnosis not present

## 2019-01-22 DIAGNOSIS — D649 Anemia, unspecified: Secondary | ICD-10-CM | POA: Diagnosis present

## 2019-01-22 DIAGNOSIS — R404 Transient alteration of awareness: Secondary | ICD-10-CM | POA: Diagnosis not present

## 2019-01-22 DIAGNOSIS — G9340 Encephalopathy, unspecified: Secondary | ICD-10-CM | POA: Diagnosis present

## 2019-01-22 DIAGNOSIS — Z66 Do not resuscitate: Secondary | ICD-10-CM | POA: Diagnosis not present

## 2019-01-22 DIAGNOSIS — R42 Dizziness and giddiness: Secondary | ICD-10-CM | POA: Diagnosis not present

## 2019-01-22 DIAGNOSIS — R000 Tachycardia, unspecified: Secondary | ICD-10-CM | POA: Diagnosis not present

## 2019-01-22 DIAGNOSIS — I129 Hypertensive chronic kidney disease with stage 1 through stage 4 chronic kidney disease, or unspecified chronic kidney disease: Secondary | ICD-10-CM | POA: Diagnosis present

## 2019-01-22 DIAGNOSIS — R41841 Cognitive communication deficit: Secondary | ICD-10-CM | POA: Diagnosis not present

## 2019-01-22 DIAGNOSIS — I471 Supraventricular tachycardia, unspecified: Secondary | ICD-10-CM | POA: Diagnosis present

## 2019-01-22 DIAGNOSIS — R0902 Hypoxemia: Secondary | ICD-10-CM | POA: Diagnosis not present

## 2019-01-22 DIAGNOSIS — R4189 Other symptoms and signs involving cognitive functions and awareness: Secondary | ICD-10-CM | POA: Diagnosis not present

## 2019-01-22 DIAGNOSIS — Z7401 Bed confinement status: Secondary | ICD-10-CM | POA: Diagnosis not present

## 2019-01-22 DIAGNOSIS — Z8051 Family history of malignant neoplasm of kidney: Secondary | ICD-10-CM

## 2019-01-22 DIAGNOSIS — Z20822 Contact with and (suspected) exposure to covid-19: Secondary | ICD-10-CM | POA: Diagnosis present

## 2019-01-22 DIAGNOSIS — I959 Hypotension, unspecified: Secondary | ICD-10-CM | POA: Diagnosis not present

## 2019-01-22 DIAGNOSIS — R2681 Unsteadiness on feet: Secondary | ICD-10-CM | POA: Diagnosis not present

## 2019-01-22 DIAGNOSIS — I340 Nonrheumatic mitral (valve) insufficiency: Secondary | ICD-10-CM | POA: Diagnosis not present

## 2019-01-22 DIAGNOSIS — M6281 Muscle weakness (generalized): Secondary | ICD-10-CM | POA: Diagnosis not present

## 2019-01-22 DIAGNOSIS — N1831 Chronic kidney disease, stage 3a: Secondary | ICD-10-CM | POA: Diagnosis present

## 2019-01-22 DIAGNOSIS — R002 Palpitations: Secondary | ICD-10-CM | POA: Diagnosis not present

## 2019-01-22 DIAGNOSIS — M2550 Pain in unspecified joint: Secondary | ICD-10-CM | POA: Diagnosis not present

## 2019-01-22 DIAGNOSIS — N183 Chronic kidney disease, stage 3 unspecified: Secondary | ICD-10-CM | POA: Diagnosis present

## 2019-01-22 DIAGNOSIS — R55 Syncope and collapse: Secondary | ICD-10-CM | POA: Diagnosis not present

## 2019-01-22 LAB — BASIC METABOLIC PANEL
Anion gap: 8 (ref 5–15)
BUN: 13 mg/dL (ref 8–23)
CO2: 15 mmol/L — ABNORMAL LOW (ref 22–32)
Calcium: 7.1 mg/dL — ABNORMAL LOW (ref 8.9–10.3)
Chloride: 113 mmol/L — ABNORMAL HIGH (ref 98–111)
Creatinine, Ser: 0.93 mg/dL (ref 0.44–1.00)
GFR calc Af Amer: >60 mL/min (ref >60)
GFR calc non Af Amer: 54 mL/min — ABNORMAL LOW (ref >60)
Glucose, Bld: 103 mg/dL — ABNORMAL HIGH (ref 70–99)
Potassium: 3.4 mmol/L — ABNORMAL LOW (ref 3.5–5.1)
Sodium: 136 mmol/L (ref 135–145)

## 2019-01-22 LAB — CBC WITH DIFFERENTIAL/PLATELET
Abs Immature Granulocytes: 0.14 10*3/uL — ABNORMAL HIGH (ref 0.00–0.07)
Basophils Absolute: 0 10*3/uL (ref 0.0–0.1)
Basophils Relative: 0 %
Eosinophils Absolute: 0.1 10*3/uL (ref 0.0–0.5)
Eosinophils Relative: 1 %
HCT: 22.9 % — ABNORMAL LOW (ref 36.0–46.0)
Hemoglobin: 6.5 g/dL — CL (ref 12.0–15.0)
Immature Granulocytes: 1 %
Lymphocytes Relative: 10 %
Lymphs Abs: 1.2 10*3/uL (ref 0.7–4.0)
MCH: 25.6 pg — ABNORMAL LOW (ref 26.0–34.0)
MCHC: 28.4 g/dL — ABNORMAL LOW (ref 30.0–36.0)
MCV: 90.2 fL (ref 80.0–100.0)
Monocytes Absolute: 0.5 10*3/uL (ref 0.1–1.0)
Monocytes Relative: 4 %
Neutro Abs: 10.4 10*3/uL — ABNORMAL HIGH (ref 1.7–7.7)
Neutrophils Relative %: 84 %
Platelets: 324 10*3/uL (ref 150–400)
RBC: 2.54 MIL/uL — ABNORMAL LOW (ref 3.87–5.11)
RDW: 15.2 % (ref 11.5–15.5)
WBC: 12.3 10*3/uL — ABNORMAL HIGH (ref 4.0–10.5)
nRBC: 0 % (ref 0.0–0.2)

## 2019-01-22 LAB — PREPARE RBC (CROSSMATCH)

## 2019-01-22 LAB — ABO/RH: ABO/RH(D): A NEG

## 2019-01-22 LAB — POC OCCULT BLOOD, ED: Fecal Occult Bld: NEGATIVE

## 2019-01-22 MED ORDER — HEPARIN SODIUM (PORCINE) 5000 UNIT/ML IJ SOLN
5000.0000 [IU] | Freq: Three times a day (TID) | INTRAMUSCULAR | Status: DC
  Administered 2019-01-23 – 2019-01-28 (×17): 5000 [IU] via SUBCUTANEOUS
  Filled 2019-01-22 (×15): qty 1

## 2019-01-22 MED ORDER — ACETAMINOPHEN 325 MG PO TABS
650.0000 mg | ORAL_TABLET | Freq: Four times a day (QID) | ORAL | Status: DC | PRN
  Administered 2019-01-25: 650 mg via ORAL
  Filled 2019-01-22: qty 2

## 2019-01-22 MED ORDER — ONDANSETRON HCL 4 MG PO TABS: 4.0000 mg | ORAL_TABLET | Freq: Four times a day (QID) | ORAL | Status: DC | PRN

## 2019-01-22 MED ORDER — METOPROLOL TARTRATE 5 MG/5ML IV SOLN
5.0000 mg | Freq: Once | INTRAVENOUS | Status: AC
  Administered 2019-01-22: 20:00:00 5 mg via INTRAVENOUS
  Filled 2019-01-22: qty 5

## 2019-01-22 MED ORDER — SODIUM CHLORIDE 0.9 % IV BOLUS
500.0000 mL | Freq: Once | INTRAVENOUS | Status: AC
  Administered 2019-01-22: 20:00:00 500 mL via INTRAVENOUS

## 2019-01-22 MED ORDER — ONDANSETRON HCL 4 MG/2ML IJ SOLN: 4.0000 mg | Freq: Four times a day (QID) | INTRAMUSCULAR | Status: DC | PRN

## 2019-01-22 MED ORDER — ACETAMINOPHEN 650 MG RE SUPP: 650.0000 mg | Freq: Four times a day (QID) | RECTAL | Status: DC | PRN

## 2019-01-22 MED ORDER — SODIUM CHLORIDE 0.9% IV SOLUTION: Freq: Once | INTRAVENOUS | Status: AC

## 2019-01-22 NOTE — ED Notes (Signed)
[place] pts daughter

## 2019-01-22 NOTE — ED Provider Notes (Signed)
[facility] EMERGENCY DEPARTMENT Provider Note   CSN: [id number] Arrival date & time: 01/22/19  1928     History Chief Complaint  Patient presents with  . Weakness  . Tachycardia    Natalie Fitzgerald is a 86 y.o. female.  HPI She presents by EMS for evaluation of general weakness.  EMS found her with heart rate in the 190s, treated her with adenosine 6 mg, without resolution, but lowering of heart rate to about 160.  Blood pressure low in the field and 95/58.  Patient is not sure what has happened today.  She states that she lives with her daughter.  She denies recent illnesses.  She states she is taking all of her usual medications.  She does not think that she is ever had atrial fibrillation.  There are no other known modifying factors.    Past Medical History:  Diagnosis Date  . Aortic valve disorders    moderate aortic insufficiency  . Atypical mole    nose  . Dizziness   . Glaucoma   . Nipple problem     Patient Active Problem List   Diagnosis Date Noted  . SVT (supraventricular tachycardia) ([place]) 01/22/2019  . Unresponsive episode 01/22/2019  . Normocytic normochromic anemia 01/22/2019  . DNR no code (do not resuscitate) 12/24/2018  . Unstable gait 06/25/2017  . CKD (chronic kidney disease), stage III 06/25/2017  . Aortic insufficiency 01/14/2014  . Hypertension 09/13/2010  . Left breast mass, uoq 07/19/2010  . SEBORRHEIC KERATOSIS 11/27/2008  . LOW BACK PAIN 01/24/2007  . OSTEOPENIA 01/24/2007  . Hyperlipemia 01/09/2007  . Allergic rhinitis 01/09/2007    Past Surgical History:  Procedure Laterality Date  . APPENDECTOMY    . BACK SURGERY       OB History   No obstetric history on file.     Family History  Problem Relation Age of Onset  . Cancer Mother        kidney  . Alcohol abuse Father     Social History   Tobacco Use  . Smoking status: Never Smoker  . Smokeless tobacco: Never Used  Substance Use Topics  . Alcohol use:  No  . Drug use: No    Home Medications Prior to Admission medications   Medication Sig Start Date End Date Taking? Authorizing Provider  amLODipine (NORVASC) 2.5 MG tablet Take 1 tablet (2.5 mg total) by mouth daily. Patient taking differently: Take 2.5 mg by mouth at bedtime.  12/24/18  Yes Martinique, Betty G, MD  Cholecalciferol (VITAMIN D-3) 125 MCG (5000 UT) TABS Take 5,000 Units by mouth daily with breakfast.   Yes [provider]  Multiple Vitamins-Minerals (ONE-A-DAY WOMENS 50 PLUS) TABS Take 1 tablet by mouth daily with breakfast.   Yes [provider]  vitamin B-12 (CYANOCOBALAMIN) 50 MCG tablet Take 50 mcg by mouth daily with breakfast.   Yes [provider]    Allergies    Patient has no known allergies.  Review of Systems   Review of Systems  All other systems reviewed and are negative.   Physical Exam Updated Vital Signs BP (!) 168/72   Pulse 86   Resp 18   SpO2 99%   Physical Exam Vitals and nursing note reviewed.  Constitutional:      General: She is in acute distress.     Appearance: She is well-developed. She is not ill-appearing, toxic-appearing or diaphoretic.     Comments: Elderly, frail  HENT:  Head: Normocephalic and atraumatic.     Right Ear: External ear normal.     Left Ear: External ear normal.  Eyes:     Conjunctiva/sclera: Conjunctivae normal.     Pupils: Pupils are equal, round, and reactive to light.  Neck:     Trachea: Phonation normal.  Cardiovascular:     Rate and Rhythm: Tachycardia present. Rhythm irregular.     Heart sounds: Normal heart sounds.     Comments: Irregular pulse, right breast, on correlation with cardiac monitor, it appears that she is not perfusing some beats Pulmonary:     Effort: Pulmonary effort is normal.     Breath sounds: Normal breath sounds.  Abdominal:     Palpations: Abdomen is soft.     Tenderness: There is no abdominal tenderness.  Genitourinary:    Comments: Normal anus.   No stool in rectal vault, mucus sent for heme testing. Musculoskeletal:        General: Normal range of motion.     Cervical back: Normal range of motion and neck supple.  Skin:    General: Skin is warm and dry.  Neurological:     Mental Status: She is alert and oriented to person, place, and time.     Cranial Nerves: No cranial nerve deficit.     Sensory: No sensory deficit.     Motor: No abnormal muscle tone.     Coordination: Coordination normal.  Psychiatric:        Mood and Affect: Mood normal.        Behavior: Behavior normal.        Thought Content: Thought content normal.        Judgment: Judgment normal.     ED Results / Procedures / Treatments   Labs (all labs ordered are listed, but only abnormal results are displayed) Labs Reviewed  BASIC METABOLIC PANEL - Abnormal; Notable for the following components:      Result Value   Potassium 3.4 (*)    Chloride 113 (*)    CO2 15 (*)    Glucose, Bld 103 (*)    Calcium 7.1 (*)    GFR calc non Af Amer 54 (*)    All other components within normal limits  CBC WITH DIFFERENTIAL/PLATELET - Abnormal; Notable for the following components:   WBC 12.3 (*)    RBC 2.54 (*)    Hemoglobin 6.5 (*)    HCT 22.9 (*)    MCH 25.6 (*)    MCHC 28.4 (*)    Neutro Abs 10.4 (*)    Abs Immature Granulocytes 0.14 (*)    All other components within normal limits  SARS CORONAVIRUS 2 (TAT 6-24 HRS)  VITAMIN B12  FOLATE  IRON AND TIBC  FERRITIN  RETICULOCYTES  POC OCCULT BLOOD, ED  PREPARE RBC (CROSSMATCH)  TYPE AND SCREEN  ABO/RH    EKG EKG Interpretation  Date/Time:  Wednesday January 22 2019 19:37:44 EST Ventricular Rate:  161 PR Interval:    QRS Duration: 81 QT Interval:  282 QTC Calculation: 462 R Axis:   44 Text Interpretation: Supraventricular tachycardia Repolarization abnormality, prob rate related No old tracing to compare Confirmed by Daleen Bo [id number]) on 01/22/2019 9:25:41 PM    Date: 01/22/19 2003  Rate: 86   Rhythm: normal sinus rhythm  QRS Axis: normal  PR and QT Intervals: PR prolonged  ST/T Wave abnormalities: normal  PR and QRS Conduction Disutrbances:none  Narrative Interpretation:   Old EKG Reviewed: changes noted-SVT converted  to normal sinus rhythm.   Radiology DG Chest Port 1 View  Result Date: 01/22/2019 CLINICAL DATA:  86 year old female with increasing weakness. SVT, palpitations. EXAM: PORTABLE CHEST 1 VIEW COMPARISON:  Portable chest 12/13/2018. FINDINGS: Portable AP semi upright view at 2021 hours. Stable lung volumes. Mediastinal contours remain normal. Calcified aortic atherosclerosis. Visualized tracheal air column is within normal limits. Left chest pacer or resuscitation pads. Allowing for portable technique the lungs are clear. No pneumothorax. Negative visible bowel gas pattern. No acute osseous abnormality identified. IMPRESSION: No acute cardiopulmonary abnormality. Electronically Signed   By: Genevie Ann M.D.   On: 01/22/2019 20:33    Procedures .Critical Care Performed by: Daleen Bo, MD Authorized by: Daleen Bo, MD   Critical care provider statement:    Critical care time (minutes):  45   Critical care start time:  01/22/2019 7:30 PM   Critical care end time:  01/22/2019 10:53 PM   Critical care time was exclusive of:  Separately billable procedures and treating other patients   Critical care was necessary to treat or prevent imminent or life-threatening deterioration of the following conditions:  Cardiac failure   Critical care was time spent personally by me on the following activities:  Blood draw for specimens, development of treatment plan with patient or surrogate, discussions with consultants, evaluation of patient's response to treatment, examination of patient, obtaining history from patient or surrogate, ordering and performing treatments and interventions, ordering and review of laboratory studies, pulse oximetry, re-evaluation of patient's condition,  review of old charts and ordering and review of radiographic studies   (including critical care time)  Medications Ordered in ED Medications  0.9 %  sodium chloride infusion (Manually program via Guardrails IV Fluids) (has no administration in time range)  sodium chloride 0.9 % bolus 500 mL (0 mLs Intravenous Stopped 01/22/19 2052)  metoprolol tartrate (LOPRESSOR) injection 5 mg (5 mg Intravenous Given 01/22/19 1951)    ED Course  I have reviewed the triage vital signs and the nursing notes.  Pertinent labs & imaging results that were available during my care of the patient were reviewed by me and considered in my medical decision making (see chart for details).  Clinical Course as of Jan 21 2334  Wed Jan 22, 2019  1946 Patient did not have resolution of tachycardia with adenosine, suspect atrial flutter, with 2-1 block.  Will trial IV fluid bolus, followed by Lopressor, 2 mg, repeat x1 if needed   [EW]  2003 Converted to normal sinus rhythm after Lopressor   [EW]  2041 Patient states she feels less lightheaded at this time.   [EW]  2126 No infiltrate or CHF, image interpreted by me  DG Chest [facility] [EW]  2148 Patient's daughter [name] is here, and states she thinks the patient had a seizure, prior to her calling EMS today.  [name] found her standing next to her bedside commode, with her clothing on, and urine on the floor.  [name] had her sit on the commode, then the patient began shaking, both legs and left arm.  This lasted about 1 minute.  After she stopped shaking the patient was not responsive and drooling for a couple of minutes.  She then became groggy and semiresponsive, until EMS arrived.  [name] thinks she had another seizure, when she was evaluated at the emergency department in [facility], 12/13/2018.  Diagnostic evaluation included finding of anemia, and she followed up with her PCP about it on 12/24/2018.  No  interventions were done for anemia.  She was not  started on antiepileptic medications.   [EW]  2252 Normal except white count high, hemoglobin low, RBC indices low  CBC with Differential(!!) [EW]  2252 Normal except potassium low, chloride high, CO2 low, glucose high, calcium low, GFR low   [EW]    Clinical Course User Index [EW] Daleen Bo, MD   MDM Rules/Calculators/A&P                       Patient Vitals for the past 24 hrs:  BP Pulse Resp SpO2  01/22/19 2100 (!) 168/72 86 18 99 %  01/22/19 2030 (!) 154/68 87 (!) 25 100 %  01/22/19 2000 (!) 158/72 86 19 100 %  01/22/19 1957 (!) 157/75 -- -- 99 %    11:36 PM Reevaluation with update and discussion. After initial assessment and treatment, an updated evaluation reveals she is comfortable. Daleen Bo   Medical Decision Making: Patient with decreased responsiveness, at home, associated with episode of SVT.  SVT converted with IV fluids, and Lopressor.  Patient had a somewhat similar episode, about 5 weeks ago at that time was found to be anemic, at 8.2.  Today hemoglobin much lower, 6.5.  Transfusion begun, for 1 unit.  No visible blood in rectal vault.  Normal BUN/creatinine.  Doubt rectal bleeding as source.  SVT is not recurrent during observation in the emergency department.  Since she has had 2 episodes of loss of consciousness, SVT, documented, she will need admission for further evaluation, cardiology consultation, and cardiac echo.  Doubt serious bacterial infection, metabolic instability or impending vascular collapse.  LADAIJA VACCARELLI was evaluated in Emergency Department on 01/22/2019 for the symptoms described in the history of present illness. She was evaluated in the context of the global COVID-19 pandemic, which necessitated consideration that the patient might be at risk for infection with the SARS-CoV-2 virus that causes COVID-19. Institutional protocols and algorithms that pertain to the evaluation of patients at risk for COVID-19 are in a state of rapid change  based on information released by regulatory bodies including the CDC and federal and state organizations. These policies and algorithms were followed during the patient's care in the ED.  CRITICAL CARE-yes Performed by: Daleen Bo   Nursing Notes Reviewed/ Care Coordinated Applicable Imaging Reviewed Interpretation of Laboratory Data incorporated into ED treatment  11:05 PM-Consult complete with hospitalist. Patient case explained and discussed. He agrees to admit patient for further evaluation and treatment. Call ended at 1115  Plan: Admit  Final Clinical Impression(s) / ED Diagnoses Final diagnoses:  SVT (supraventricular tachycardia) ([place])  Anemia, unspecified type    Rx / DC Orders ED Discharge Orders    None       Daleen Bo, MD 01/22/19 2336

## 2019-01-22 NOTE — ED Notes (Signed)
Patient transported to CT 

## 2019-01-22 NOTE — H&P (Signed)
History and Physical    [name] [id number] DOB: [birth year] DOA: 01/22/2019  PCP: Martinique, Betty G, MD  Patient coming from: Home.  History obtained from patient's daughter.  Chief Complaint: Unresponsive episode.  HPI: Natalie Fitzgerald is a 86 y.o. female with history of recently diagnosed hypertension anemia was brought to the ER after patient had an unresponsive episode and later was found to be in SVT.  Per daughter patient had a similar episode about 6 weeks ago and was taken to the ER at [facility] where patient was observed and discharged home.  At that time patient had sudden stiffening lasted for few minutes and resolved without any incontinence of urine or bowel or confusion.  Today patient was going to move to another seat when patient suddenly became unresponsive and was having generalized shaking mostly on one extremity.  Patient remained unconscious for at least 3 to 4 minutes.  EMS arrived and was found to be in SVT and per EMS report per the ER physician patient was easily arousable and answers questions.  Did not have any incontinence of urine or bowel.  Patient was given adenosine 1 dose following which SVT resolved.  ED Course: In the ER patient was back into SVT again and was given IV metoprolol and fluid bolus following which rhythm was back into normal sinus  rhythm.  Patient's labs show hemoglobin of 6.5 which as per the daughter at this decreased from recent of 8.  Potassium 3.4 stool for occult blood was negative Covid test negative.  Chest x-ray unremarkable.  Patient had 1 unit of PRBC transfusion ordered and admitted for further management.  CT head was negative.  Review of Systems: As per HPI, rest all negative.   Past Medical History:  Diagnosis Date  . Aortic valve disorders    moderate aortic insufficiency  . Atypical mole    nose  . Dizziness   . Glaucoma   . Nipple problem     Past Surgical History:  Procedure Laterality Date  .  APPENDECTOMY    . BACK SURGERY       reports that she has never smoked. She has never used smokeless tobacco. She reports that she does not drink alcohol or use drugs.  No Known Allergies  Family History  Problem Relation Age of Onset  . Cancer Mother        kidney  . Alcohol abuse Father     Prior to Admission medications   Medication Sig Start Date End Date Taking? Authorizing Provider  amLODipine (NORVASC) 2.5 MG tablet Take 1 tablet (2.5 mg total) by mouth daily. Patient taking differently: Take 2.5 mg by mouth at bedtime.  12/24/18  Yes Martinique, Betty G, MD  Cholecalciferol (VITAMIN D-3) 125 MCG (5000 UT) TABS Take 5,000 Units by mouth daily with breakfast.   Yes [provider]  Multiple Vitamins-Minerals (ONE-A-DAY WOMENS 50 PLUS) TABS Take 1 tablet by mouth daily with breakfast.   Yes [provider]  vitamin B-12 (CYANOCOBALAMIN) 50 MCG tablet Take 50 mcg by mouth daily with breakfast.   Yes [provider]    Physical Exam: Constitutional: Moderately built and nourished. Vitals:   01/22/19 1957 01/22/19 2000 01/22/19 2030 01/22/19 2100  BP: (!) 157/75 (!) 158/72 (!) 154/68 (!) 168/72  Pulse:  86 87 86  Resp:  19 (!) 25 18  SpO2: 99% 100% 100% 99%   Eyes: Anicteric no pallor. ENMT: No discharge from the ears eyes nose  or mouth. Neck: No mass felt.  No neck rigidity. Respiratory: No rhonchi or crepitations. Cardiovascular: S1-S2 heard. Abdomen: Soft nontender bowel sounds present. Musculoskeletal: No edema. Skin: No rash. Neurologic: Alert awake oriented to name.  Follows commands moves all extremities. Psychiatric: Oriented to her name.   Labs on Admission: I have personally reviewed following labs and imaging studies  CBC: Recent Labs  Lab 01/22/19 2053  WBC 12.3*  NEUTROABS 10.4*  HGB 6.5*  HCT 22.9*  MCV 90.2  PLT 0000000   Basic Metabolic Panel: Recent Labs  Lab 01/22/19 2053  NA 136  K 3.4*  CL 113*  CO2 15*   GLUCOSE 103*  BUN 13  CREATININE 0.93  CALCIUM 7.1*   GFR: CrCl cannot be calculated (Unknown ideal weight.). Liver Function Tests: No results for input(s): AST, ALT, ALKPHOS, BILITOT, PROT, ALBUMIN in the last 168 hours. No results for input(s): LIPASE, AMYLASE in the last 168 hours. No results for input(s): AMMONIA in the last 168 hours. Coagulation Profile: No results for input(s): INR, PROTIME in the last 168 hours. Cardiac Enzymes: No results for input(s): CKTOTAL, CKMB, CKMBINDEX, TROPONINI in the last 168 hours. BNP (last 3 results) No results for input(s): PROBNP in the last 8760 hours. HbA1C: No results for input(s): HGBA1C in the last 72 hours. CBG: No results for input(s): GLUCAP in the last 168 hours. Lipid Profile: No results for input(s): CHOL, HDL, LDLCALC, TRIG, CHOLHDL, LDLDIRECT in the last 72 hours. Thyroid Function Tests: No results for input(s): TSH, T4TOTAL, FREET4, T3FREE, THYROIDAB in the last 72 hours. Anemia Panel: No results for input(s): VITAMINB12, FOLATE, FERRITIN, TIBC, IRON, RETICCTPCT in the last 72 hours. Urine analysis:    Component Value Date/Time   BILIRUBINUR n 12/23/2012 1637   PROTEINUR n 12/23/2012 1637   UROBILINOGEN 0.2 12/23/2012 1637   NITRITE pos 12/23/2012 1637   LEUKOCYTESUR small (1+) 12/23/2012 1637   Sepsis Labs: @LABRCNTIP (procalcitonin:4,lacticidven:4) )No results found for this or any previous visit (from the past 240 hour(s)).   Radiological Exams on Admission: DG Chest Port 1 View  Result Date: 01/22/2019 CLINICAL DATA:  86 year old female with increasing weakness. SVT, palpitations. EXAM: PORTABLE CHEST 1 VIEW COMPARISON:  Portable chest 12/13/2018. FINDINGS: Portable AP semi upright view at 2021 hours. Stable lung volumes. Mediastinal contours remain normal. Calcified aortic atherosclerosis. Visualized tracheal air column is within normal limits. Left chest pacer or resuscitation pads. Allowing for portable  technique the lungs are clear. No pneumothorax. Negative visible bowel gas pattern. No acute osseous abnormality identified. IMPRESSION: No acute cardiopulmonary abnormality. Electronically Signed   By: Genevie Ann M.D.   On: 01/22/2019 20:33    EKG: Independently reviewed.  SVT.  Assessment/Plan Principal Problem:   Unresponsive episode Active Problems:   Hypertension   Aortic insufficiency   CKD (chronic kidney disease), stage III   DNR no code (do not resuscitate)   SVT (supraventricular tachycardia) ([facility])   Anemia    1. Unresponsive episode -cause not clear.  Differentials include syncope from the SVT episode and patient did have some tonic-clonic like activities for which I have ordered EEG CT head is unremarkable.  Closely monitor. 2. SVT  -responded to adenosine at the field.  1 dose of IV metoprolol was given in the ER after a second episode happened.  Also responded to fluids.  Check TSH 2D echo and cardiology consulted. 3. Worsening anemia because of which is not clear check anemia panel 1 unit of PRBC transfusion has been ordered.  Follow CBC. 4. Hypertension recently started on amlodipine by patient's primary care physician after the recent visit in the ER.  Closely follow blood pressure trends. 5. Possible memory issues.   DVT prophylaxis: Lovenox. Code Status: DNR confirmed with patient's daughter. Family Communication: Patient's daughter. Disposition Plan: Home. Consults called: Cardiology. Admission status: Observation.   Rise Patience MD [facility] Pager [phone].  If 7PM-7AM, please contact night-coverage [email or web address] Password [email or web address]  01/22/2019, 11:40 PM

## 2019-01-22 NOTE — ED Triage Notes (Addendum)
Came in with [facility], reportedly complained of worsening weakness. Initial 12 lead reading with SVT in the 190's BPM, EMS gave 6 mg of adenosine enroute to the hospital. Currently rates in he 160's BPM, and feels like she's going to fall down. Denies pain or dizziness, last BP at 95/58. The patient is DNR, A/Ox4 and lives with daughter.

## 2019-01-23 ENCOUNTER — Observation Stay (HOSPITAL_BASED_OUTPATIENT_CLINIC_OR_DEPARTMENT_OTHER): Payer: Medicare HMO

## 2019-01-23 ENCOUNTER — Observation Stay (HOSPITAL_COMMUNITY)
Admit: 2019-01-23 | Discharge: 2019-01-23 | Disposition: A | Discharge status: Home / Self Care | Payer: Medicare HMO | Attending: Internal Medicine | Admitting: Internal Medicine

## 2019-01-23 DIAGNOSIS — I351 Nonrheumatic aortic (valve) insufficiency: Secondary | ICD-10-CM

## 2019-01-23 DIAGNOSIS — I340 Nonrheumatic mitral (valve) insufficiency: Secondary | ICD-10-CM

## 2019-01-23 DIAGNOSIS — R4189 Other symptoms and signs involving cognitive functions and awareness: Secondary | ICD-10-CM | POA: Diagnosis not present

## 2019-01-23 DIAGNOSIS — I471 Supraventricular tachycardia: Secondary | ICD-10-CM | POA: Diagnosis present

## 2019-01-23 DIAGNOSIS — Z66 Do not resuscitate: Secondary | ICD-10-CM

## 2019-01-23 DIAGNOSIS — D649 Anemia, unspecified: Secondary | ICD-10-CM | POA: Diagnosis present

## 2019-01-23 DIAGNOSIS — N1831 Chronic kidney disease, stage 3a: Secondary | ICD-10-CM | POA: Diagnosis present

## 2019-01-23 DIAGNOSIS — G9340 Encephalopathy, unspecified: Secondary | ICD-10-CM | POA: Diagnosis present

## 2019-01-23 DIAGNOSIS — Z8051 Family history of malignant neoplasm of kidney: Secondary | ICD-10-CM | POA: Diagnosis not present

## 2019-01-23 DIAGNOSIS — Z79899 Other long term (current) drug therapy: Secondary | ICD-10-CM | POA: Diagnosis not present

## 2019-01-23 DIAGNOSIS — Z20822 Contact with and (suspected) exposure to covid-19: Secondary | ICD-10-CM | POA: Diagnosis present

## 2019-01-23 DIAGNOSIS — I129 Hypertensive chronic kidney disease with stage 1 through stage 4 chronic kidney disease, or unspecified chronic kidney disease: Secondary | ICD-10-CM | POA: Diagnosis present

## 2019-01-23 LAB — RETICULOCYTES
Immature Retic Fract: 22 % — ABNORMAL HIGH (ref 2.3–15.9)
RBC.: 3.9 MIL/uL (ref 3.87–5.11)
Retic Count, Absolute: 55.4 10*3/uL (ref 19.0–186.0)
Retic Ct Pct: 1.4 % (ref 0.4–3.1)

## 2019-01-23 LAB — COMPREHENSIVE METABOLIC PANEL
ALT: 16 U/L (ref 0–44)
AST: 30 U/L (ref 15–41)
Albumin: 2.9 g/dL — ABNORMAL LOW (ref 3.5–5.0)
Alkaline Phosphatase: 84 U/L (ref 38–126)
Anion gap: 12 (ref 5–15)
BUN: 16 mg/dL (ref 8–23)
CO2: 23 mmol/L (ref 22–32)
Calcium: 9.4 mg/dL (ref 8.9–10.3)
Chloride: 102 mmol/L (ref 98–111)
Creatinine, Ser: 1.16 mg/dL — ABNORMAL HIGH (ref 0.44–1.00)
GFR calc Af Amer: 48 mL/min — ABNORMAL LOW (ref >60)
GFR calc non Af Amer: 42 mL/min — ABNORMAL LOW (ref >60)
Glucose, Bld: 102 mg/dL — ABNORMAL HIGH (ref 70–99)
Potassium: 4.6 mmol/L (ref 3.5–5.1)
Sodium: 137 mmol/L (ref 135–145)
Total Bilirubin: 1.3 mg/dL — ABNORMAL HIGH (ref 0.3–1.2)
Total Protein: 6.5 g/dL (ref 6.5–8.1)

## 2019-01-23 LAB — SARS CORONAVIRUS 2 (TAT 6-24 HRS): SARS Coronavirus 2: NEGATIVE

## 2019-01-23 LAB — CBC WITH DIFFERENTIAL/PLATELET
Abs Immature Granulocytes: 0.08 10*3/uL — ABNORMAL HIGH (ref 0.00–0.07)
Basophils Absolute: 0.1 10*3/uL (ref 0.0–0.1)
Basophils Relative: 1 %
Eosinophils Absolute: 0.2 10*3/uL (ref 0.0–0.5)
Eosinophils Relative: 2 %
HCT: 31 % — ABNORMAL LOW (ref 36.0–46.0)
Hemoglobin: 9.8 g/dL — ABNORMAL LOW (ref 12.0–15.0)
Immature Granulocytes: 1 %
Lymphocytes Relative: 16 %
Lymphs Abs: 1.9 10*3/uL (ref 0.7–4.0)
MCH: 25.8 pg — ABNORMAL LOW (ref 26.0–34.0)
MCHC: 31.6 g/dL (ref 30.0–36.0)
MCV: 81.6 fL (ref 80.0–100.0)
Monocytes Absolute: 0.7 10*3/uL (ref 0.1–1.0)
Monocytes Relative: 6 %
Neutro Abs: 8.9 10*3/uL — ABNORMAL HIGH (ref 1.7–7.7)
Neutrophils Relative %: 74 %
Platelets: 391 10*3/uL (ref 150–400)
RBC: 3.8 MIL/uL — ABNORMAL LOW (ref 3.87–5.11)
RDW: 15 % (ref 11.5–15.5)
WBC: 11.9 10*3/uL — ABNORMAL HIGH (ref 4.0–10.5)
nRBC: 0 % (ref 0.0–0.2)

## 2019-01-23 LAB — FOLATE: Folate: 11.8 ng/mL (ref >5.9)

## 2019-01-23 LAB — ECHOCARDIOGRAM COMPLETE: Weight: 2067.03 oz

## 2019-01-23 LAB — IRON AND TIBC
Iron: 285 ug/dL — ABNORMAL HIGH (ref 28–170)
Saturation Ratios: 89 % — ABNORMAL HIGH (ref 10.4–31.8)
TIBC: 322 ug/dL (ref 250–450)
UIBC: 37 ug/dL

## 2019-01-23 LAB — TSH: TSH: 3.958 u[IU]/mL (ref 0.350–4.500)

## 2019-01-23 LAB — TROPONIN I (HIGH SENSITIVITY): Troponin I (High Sensitivity): 186 ng/L (ref <18)

## 2019-01-23 LAB — VITAMIN B12: Vitamin B-12: 268 pg/mL (ref 180–914)

## 2019-01-23 LAB — FERRITIN: Ferritin: 26 ng/mL (ref 11–307)

## 2019-01-23 MED ORDER — METOPROLOL TARTRATE 50 MG PO TABS
50.0000 mg | ORAL_TABLET | Freq: Two times a day (BID) | ORAL | Status: DC
  Administered 2019-01-23 – 2019-01-24 (×2): 50 mg via ORAL
  Filled 2019-01-23 (×2): qty 1

## 2019-01-23 MED ORDER — METOPROLOL TARTRATE 25 MG PO TABS
25.0000 mg | ORAL_TABLET | Freq: Two times a day (BID) | ORAL | Status: DC
  Administered 2019-01-23: 25 mg via ORAL
  Filled 2019-01-23: qty 1

## 2019-01-23 MED ORDER — METOPROLOL TARTRATE 25 MG PO TABS: 37.5000 mg | ORAL_TABLET | Freq: Two times a day (BID) | ORAL | Status: DC

## 2019-01-23 NOTE — Progress Notes (Signed)
EEG Completed; Results Pending  

## 2019-01-23 NOTE — Progress Notes (Signed)
New Admission Note:  Arrival Method: Via stretcher from ED to 73m07 Mental Orientation: Alert & Oriented x3 Telemetry: CCMD verified Assessment: Completed Skin: Refer to flowsheet IV: Left Hand Pain: 0/10 Safety Measures: Safety Fall Prevention Plan discussed with patient. Admission: Completed 5 [place] Orientation: Patient has been orientated to the room, unit and the staff.  Orders have been reviewed and are being implemented. Will continue to monitor the patient. Call light has been placed within reach and bed alarm has been activated.   Vassie Moselle, RN  Phone Number: [phone]

## 2019-01-23 NOTE — Progress Notes (Signed)
  Echocardiogram 2D Echocardiogram has been performed.  Kamran Coker A Stone Spirito 01/23/2019, 8:52 AM

## 2019-01-23 NOTE — Progress Notes (Signed)
Progress Note    Natalie Fitzgerald  [id number] DOB: Feb 18, 1927  DOA: 01/22/2019 PCP: [place], Betty G, MD    Brief Narrative:     Medical records reviewed and are as summarized below:  Natalie Fitzgerald is an 86 y.o. female with history of recently diagnosed hypertension anemia was brought to the ER after patient had an unresponsive episode and later was found to be in SVT.  Per daughter patient had a similar episode about 6 weeks ago and was taken to the ER at [facility] where patient was observed and discharged home.  At that time patient had sudden stiffening lasted for few minutes and resolved without any incontinence of urine or bowel or confusion.  Today patient was going to move to another seat when patient suddenly became unresponsive and was having generalized shaking mostly on one extremity.  Patient remained unconscious for at least 3 to 4 minutes.  EMS arrived and was found to be in SVT and per EMS report per the ER physician patient was easily arousable and answers questions.   Assessment/Plan:   Principal Problem:   Unresponsive episode Active Problems:   Hypertension   Aortic insufficiency   CKD (chronic kidney disease), stage III   DNR no code (do not resuscitate)   SVT (supraventricular tachycardia) (HCC)   Anemia   Unresponsive episode  -cause not clear--suspect orthostatic vs SVT episode  -EEG ordered by admitting MD and is pending read -PT eval in AM  SVT  -responded to adenosine at the field.   -1 dose of IV metoprolol was given in the ER after a second episode happened -TSH ok -2D echo  -cardiology consult appreciated -BB started -tele  Anemia -1 unit of PRBC transfusion give with increase from 6.5 to 9.8 -suspect this was incorrect  Hypertension - recently started on amlodipine by patient's primary care physician after the recent visit in the ER. -d/c this and start BB      Family Communication/Anticipated D/C date and plan/Code Status    DVT prophylaxis: heparin Code Status: dnr  Family Communication:  Disposition Plan:    Medical Consultants:    cards     Subjective:   Says she feel like she is falling when her heart races  Objective:    Vitals:   01/23/19 0318 01/23/19 0353 01/23/19 0627 01/23/19 0900  BP: (!) 180/68 (!) 164/70 (!) 190/86 (!) 166/81  Pulse: 81 79 74 82  Resp: 20 18 18 18   Temp: 98.7 F (37.1 C) 98.4 F (36.9 C) 98.6 F (37 C) 98 F (36.7 C)  TempSrc: Oral Oral Oral Oral  SpO2: 97% 99% 100% 98%  Weight:  58.6 kg      Intake/Output Summary (Last 24 hours) at 01/23/2019 1240 Last data filed at 01/23/2019 0800 Gross per 24 hour  Intake 620 ml  Output --  Net 620 ml   Filed Weights   01/23/19 0122 01/23/19 0353  Weight: 58.2 kg 58.6 kg    Exam: In bed, NAD rrr No increased work of breathing Chronically ill appearing, pale Alert but falls asleep quickly   Data Reviewed:   I have personally reviewed following labs and imaging studies:  Labs: Labs show the following:   Basic Metabolic Panel: Recent Labs  Lab 01/22/19 2053 01/23/19 1022  NA 136 137  K 3.4* 4.6  CL 113* 102  CO2 15* 23  GLUCOSE 103* 102*  BUN 13 16  CREATININE 0.93 1.16*  CALCIUM 7.1* 9.4  GFR Estimated Creatinine Clearance: 28.4 mL/min (A) (by C-G formula based on SCr of 1.16 mg/dL (H)). Liver Function Tests: Recent Labs  Lab 01/23/19 1022  AST 30  ALT 16  ALKPHOS 84  BILITOT 1.3*  PROT 6.5  ALBUMIN 2.9*   No results for input(s): LIPASE, AMYLASE in the last 168 hours. No results for input(s): AMMONIA in the last 168 hours. Coagulation profile No results for input(s): INR, PROTIME in the last 168 hours.  CBC: Recent Labs  Lab 01/22/19 2053 01/23/19 1022  WBC 12.3* 11.9*  NEUTROABS 10.4* 8.9*  HGB 6.5* 9.8*  HCT 22.9* 31.0*  MCV 90.2 81.6  PLT 324 391   Cardiac Enzymes: No results for input(s): CKTOTAL, CKMB, CKMBINDEX, TROPONINI in the last 168 hours. BNP (last 3  results) No results for input(s): PROBNP in the last 8760 hours. CBG: No results for input(s): GLUCAP in the last 168 hours. D-Dimer: No results for input(s): DDIMER in the last 72 hours. Hgb A1c: No results for input(s): HGBA1C in the last 72 hours. Lipid Profile: No results for input(s): CHOL, HDL, LDLCALC, TRIG, CHOLHDL, LDLDIRECT in the last 72 hours. Thyroid function studies: Recent Labs    01/23/19 1022  TSH 3.958   Anemia work up: Recent Labs    01/23/19 1022  VITAMINB12 268  FOLATE 11.8  FERRITIN 26  TIBC 322  IRON 285*  RETICCTPCT 1.4   Sepsis Labs: Recent Labs  Lab 01/22/19 2053 01/23/19 1022  WBC 12.3* 11.9*    Microbiology Recent Results (from the past 240 hour(s))  SARS CORONAVIRUS 2 (TAT 6-24 HRS) Nasopharyngeal Nasopharyngeal Swab     Status: None   Collection Time: 01/22/19 10:09 PM   Specimen: Nasopharyngeal Swab  Result Value Ref Range Status   SARS Coronavirus 2 NEGATIVE NEGATIVE Final    Comment: (NOTE) SARS-CoV-2 target nucleic acids are NOT DETECTED. The SARS-CoV-2 RNA is generally detectable in upper and lower respiratory specimens during the acute phase of infection. Negative results do not preclude SARS-CoV-2 infection, do not rule out co-infections with other pathogens, and should not be used as the sole basis for treatment or other patient management decisions. Negative results must be combined with clinical observations, patient history, and epidemiological information. The expected result is Negative. Fact Sheet for Patients: SugarRoll.be Fact Sheet for Healthcare Providers: https://www.woods-mathews.com/ This test is not yet approved or cleared by the [place] FDA and  has been authorized for detection and/or diagnosis of SARS-CoV-2 by FDA under an Emergency Use Authorization (EUA). This EUA will remain  in effect (meaning this test can be used) for the duration of the COVID-19  declaration under Section 56 4(b)(1) of the Act, 21 U.S.C. section 360bbb-3(b)(1), unless the authorization is terminated or revoked sooner. Performed at [facility], [place] [address] [place], [place] [address]     Procedures and diagnostic studies:  CT HEAD WO CONTRAST  Result Date: 01/22/2019 CLINICAL DATA:  Encephalopathy, dizziness EXAM: CT HEAD WITHOUT CONTRAST TECHNIQUE: Contiguous axial images were obtained from the base of the skull through the vertex without intravenous contrast. COMPARISON:  None. FINDINGS: Brain: No evidence of acute infarction, hemorrhage, hydrocephalus, extra-axial collection or mass lesion/mass effect. Symmetric prominence of the ventricles, cisterns and sulci compatible with moderate parenchymal volume loss. Patchy areas of white matter hypoattenuation are most compatible with chronic microvascular angiopathy. Vascular: Atherosclerotic calcification of the carotid siphons and intradural vertebral arteries. No hyperdense vessel. Skull: No calvarial fracture or suspicious osseous lesion. No scalp swelling or  hematoma. Sinuses/Orbits: Paranasal sinuses and mastoid air cells are predominantly clear. Orbital structures are unremarkable aside from prior lens extractions. Other: Debris in the left external auditory canal. IMPRESSION: 1. No acute intracranial abnormality. 2. Moderate parenchymal volume loss and chronic microvascular angiopathy. 3. Intracranial atherosclerosis 4. Debris present in the left external auditory canal, correlate with direct visualization to exclude a cerumen impaction. Electronically Signed   By: Lovena Le M.D.   On: 01/22/2019 23:52   DG Chest Port 1 View  Result Date: 01/22/2019 CLINICAL DATA:  86 year old female with increasing weakness. SVT, palpitations. EXAM: PORTABLE CHEST 1 VIEW COMPARISON:  Portable chest 12/13/2018. FINDINGS: Portable AP semi upright view at 2021 hours. Stable lung volumes. Mediastinal contours remain normal.  Calcified aortic atherosclerosis. Visualized tracheal air column is within normal limits. Left chest pacer or resuscitation pads. Allowing for portable technique the lungs are clear. No pneumothorax. Negative visible bowel gas pattern. No acute osseous abnormality identified. IMPRESSION: No acute cardiopulmonary abnormality. Electronically Signed   By: Genevie Ann M.D.   On: 01/22/2019 20:33    Medications:   . heparin  5,000 Units Subcutaneous Q8H  . metoprolol tartrate  25 mg Oral BID   Continuous Infusions:   LOS: 0 days   Geradine Girt  [facility]   How to contact the [facility] Attending or Consulting provider [place] or covering provider during after hours [place], for this patient?  1. Check the care team in [facility] and look for a) attending/consulting [facility] provider listed and b) the [facility] team listed 2. Log into [email or web address] and use [place] universal password to access. If you do not have the password, please contact the hospital operator. 3. Locate the [facility] provider you are looking for under [facility] and page to a number that you can be directly reached. 4. If you still have difficulty reaching the provider, please page the [facility] (Director on Call) for the Hospitalists listed on amion for assistance.  01/23/2019, 12:40 PM

## 2019-01-23 NOTE — Progress Notes (Signed)
On call provider Baltazar Najjar) made aware. No new orders at this time.     01/23/19 0318  Vitals  BP (!) 180/68

## 2019-01-23 NOTE — Progress Notes (Signed)
Arrived @ bedside for PIV placement per consult. PIV already placed by primary care RN. Vast not needed.

## 2019-01-23 NOTE — Procedures (Signed)
Patient Name: Natalie Fitzgerald  MRN: [id number]  Epilepsy Attending: Lora Havens  Referring Physician/Provider: Dr. Gean Birchwood Date: 01/23/2019 Duration: 25.20 minutes  Patient history: 86 year old female who presented with an episode of transient alteration of awareness.  EEG to evaluate for seizures.  Level of alertness: Awake  AEDs during EEG study: None  Technical aspects: This EEG study was done with scalp electrodes positioned according to the 10-20 International system of electrode placement. Electrical activity was acquired at a sampling rate of 500Hz  and reviewed with a high frequency filter of 70Hz  and a low frequency filter of 1Hz . EEG data were recorded continuously and digitally stored.   Description: EEG showed continuous generalized 3 to 6 Hz theta-delta slowing.  No clear posterior dominant rhythm was seen.  Hyperventilation and photic summation were not performed.  Abnormalities -Continuous slow, generalized  IMPRESSION: This study is suggestive of mild to moderate diffuse encephalopathy, nonspecific etiology. No seizures or epileptiform discharges were seen throughout the recording.

## 2019-01-23 NOTE — Consult Note (Addendum)
Cardiology Consultation:   Patient ID: Natalie Fitzgerald; [id number]; [birth year]   Admit date: 01/22/2019 Date of Consult: 01/23/2019  Primary Care Provider: Martinique, Betty G, MD Primary Cardiologist: Dr. Quay Burow, MD/previously Dr. Acie Fredrickson  Patient Profile:   Natalie Fitzgerald is a 86 y.o. female with a hx of borderline hypertension not on antihypertensives, mild aortic insufficiency and CKD stage III per echocardiogram 01/21/2014 with normal LV who is being seen today for the evaluation of SVT at the request of Dr. Hal Hope.  History of Present Illness:   Natalie Fitzgerald is an 86 year old female with a history stated above who presented to [facility] on 01/23/2019 after an unresponsive episode later found to be in SVT.  HPI obtained from chart review as the patient cannot recall the events which led to her admission. Per chart review, patient and daughter live together and daughter reported that she found her mother standing next to her bedside commode fully clothed with urine on the floor. Daughter had her sit on the bedside commode at which time the patient began shaking in both upper and lower extremities for approximately 1 minute duration. She then became unresponsive with staring and drooling for several minutes thereafter. She did become more responsive on EMS arrival. Daughter reported that she had a similar episode approximately 6 weeks ago and was taken to [facility] ED with an unremarkable work-up.  At that time, patient seemed to have a sudden stiffening of her body which lasted for several minutes and would resolve on its own.    On EMS arrival, patient found to be in SVT with heart rate in the 190s and was therefore given 6 mg of adenosine without resolution however heart rate did decrease to the the 160s.  BP in the field was noted to be 95/58.  On ED arrival, she had an additional episode of SVT and was given IV metoprolol with IV fluid bolus which converted her back to NSR.  Hemoglobin  was found to be 6.5. Covid testing was negative.  CXR without acute cardiopulmonary concerns. She was transfused with 1 unit PRBC. Head CT no acute intracranial abnormality. Repeat hemoglobin this a.m. found to be 9.8.  Echocardiogram is pending. On my interview, the patient is very pleasant however cannot recall most of her recent events. She states that she had no pain, SOB, dizziness, palpitations or N/V prior to the episode. She states that she has more recently, since about Christmas, has been more fatigued and will sleep more than her usual.  Per chart review, patient was recently seen in the emergency department 12/2018 with similar loss of consciousness at which time her BP was found to be elevated at 182/70.  She had no recurrent episodes during her ED evaluation. UA was negative for nitrates. Daughter had concerns at PCP follow up in 12/2018 about gait issues and reported that her mother had  "body disorientation".  Daughter also reported intermittent episodes of confusion which seemed worse in the evening but seemed progressive over the last several months.   She has a history of hypertension and mild aortic insufficiency per echocardiogram 01/21/2014 and was last seen by Dr. Gwenlyn Found 07/30/2018 with no symptoms and therefore no changes indicated at that time.   Past Medical History:  Diagnosis Date  . Aortic valve disorders    moderate aortic insufficiency  . Atypical mole    nose  . Dizziness   . Glaucoma   . Nipple problem     Past Surgical  History:  Procedure Laterality Date  . APPENDECTOMY    . BACK SURGERY       Prior to Admission medications   Medication Sig Start Date End Date Taking? Authorizing Provider  amLODipine (NORVASC) 2.5 MG tablet Take 1 tablet (2.5 mg total) by mouth daily. Patient taking differently: Take 2.5 mg by mouth at bedtime.  12/24/18  Yes Martinique, Betty G, MD  Cholecalciferol (VITAMIN D-3) 125 MCG (5000 UT) TABS Take 5,000 Units by mouth daily with  breakfast.   Yes [provider]  Multiple Vitamins-Minerals (ONE-A-DAY WOMENS 50 PLUS) TABS Take 1 tablet by mouth daily with breakfast.   Yes [provider]  vitamin B-12 (CYANOCOBALAMIN) 50 MCG tablet Take 50 mcg by mouth daily with breakfast.   Yes [provider]    Inpatient Medications: Scheduled Meds: . heparin  5,000 Units Subcutaneous Q8H  . metoprolol tartrate  25 mg Oral BID   Continuous Infusions:  PRN Meds: acetaminophen **OR** acetaminophen, ondansetron **OR** ondansetron (ZOFRAN) IV  Allergies:   No Known Allergies  Social History:   Social History   Socioeconomic History  . Marital status: Married    Spouse name: Not on file  . Number of children: Not on file  . Years of education: Not on file  . Highest education level: Not on file  Occupational History  . Not on file  Tobacco Use  . Smoking status: Never Smoker  . Smokeless tobacco: Never Used  Substance and Sexual Activity  . Alcohol use: No  . Drug use: No  . Sexual activity: Not on file  Other Topics Concern  . Not on file  Social History Narrative  . Not on file   Social Determinants of Health   Financial Resource Strain:   . Difficulty of Paying Living Expenses: Not on file  Food Insecurity:   . Worried About Charity fundraiser in the Last Year: Not on file  . Ran Out of Food in the Last Year: Not on file  Transportation Needs:   . Lack of Transportation (Medical): Not on file  . Lack of Transportation (Non-Medical): Not on file  Physical Activity:   . Days of Exercise per Week: Not on file  . Minutes of Exercise per Session: Not on file  Stress:   . Feeling of Stress : Not on file  Social Connections:   . Frequency of Communication with Friends and Family: Not on file  . Frequency of Social Gatherings with Friends and Family: Not on file  . Attends Religious Services: Not on file  . Active Member of Clubs or Organizations: Not on file  . Attends Theatre manager Meetings: Not on file  . Marital Status: Not on file  Intimate Partner Violence:   . Fear of Current or Ex-Partner: Not on file  . Emotionally Abused: Not on file  . Physically Abused: Not on file  . Sexually Abused: Not on file    Family History:   Family History  Problem Relation Age of Onset  . Cancer Mother        kidney  . Alcohol abuse Father    Family Status:  Family Status  Relation Name Status  . Mother  Deceased  . Father  Deceased  . Brother  Alive       degenerative spine    ROS:  Please see the history of present illness.  All other ROS reviewed and negative.     Physical Exam/Data:   Vitals:  01/23/19 0318 01/23/19 0353 01/23/19 0627 01/23/19 0900  BP: (!) 180/68 (!) 164/70 (!) 190/86 (!) 166/81  Pulse: 81 79 74 82  Resp: 20 18 18 18   Temp: 98.7 F (37.1 C) 98.4 F (36.9 C) 98.6 F (37 C) 98 F (36.7 C)  TempSrc: Oral Oral Oral Oral  SpO2: 97% 99% 100% 98%  Weight:  58.6 kg      Intake/Output Summary (Last 24 hours) at 01/23/2019 1156 Last data filed at 01/23/2019 0601 Gross per 24 hour  Intake 500 ml  Output --  Net 500 ml   Filed Weights   01/23/19 0122 01/23/19 0353  Weight: 58.2 kg 58.6 kg   Body mass index is 22.18 kg/m.   General: Elderly, NAD Skin: Warm, dry, intact  Neck: Negative for carotid bruits. No JVD Lungs:Clear to ausculation bilaterally. No wheezes. Breathing is unlabored. Cardiovascular: RRR with S1 S2. No murmur Abdomen: Soft, non-tender, non-distended. No obvious abdominal masses. Extremities: No edema. DP pulses 1+ bilaterally Neuro: Alert and oriented to person, place and moderately to situation. No focal deficits. No facial asymmetry. MAE spontaneously. Psych: Responds to questions somewhat appropriately with normal affect.    EKG:  The EKG was personally reviewed and demonstrates: 02/08/2019 supraventricular tachycardia, HR 161 bpm with early repolarization Telemetry:  Telemetry was personally  reviewed and demonstrates:  01/23/19 NSR with rates in the 70-80's   Relevant CV Studies:  Echocardiogram 01/21/2014:  Study Conclusions   - Left ventricle: The cavity size was normal. Wall thickness was  normal. Systolic function was normal. The estimated ejection  fraction was in the range of 60% to 65%. Doppler parameters are  consistent with abnormal left ventricular relaxation (grade 1  diastolic dysfunction).  - Aortic valve: There was mild regurgitation.  - Atrial septum: No defect or patent foramen ovale was identified   Laboratory Data:  Chemistry Recent Labs  Lab 01/22/19 2053  NA 136  K 3.4*  CL 113*  CO2 15*  GLUCOSE 103*  BUN 13  CREATININE 0.93  CALCIUM 7.1*  GFRNONAA 54*  GFRAA >60  ANIONGAP 8    Total Protein  Date Value Ref Range Status  06/28/2016 6.3 6.0 - 8.3 g/dL Final   Albumin  Date Value Ref Range Status  06/28/2016 3.8 3.5 - 5.2 g/dL Final   AST  Date Value Ref Range Status  06/28/2016 16 0 - 37 U/L Final   ALT  Date Value Ref Range Status  06/28/2016 12 0 - 35 U/L Final   Alkaline Phosphatase  Date Value Ref Range Status  06/28/2016 74 39 - 117 U/L Final   Total Bilirubin  Date Value Ref Range Status  06/28/2016 0.3 0.2 - 1.2 mg/dL Final   Hematology Recent Labs  Lab 01/22/19 2053 01/23/19 1022  WBC 12.3* 11.9*  RBC 2.54* 3.80*  3.90  HGB 6.5* 9.8*  HCT 22.9* 31.0*  MCV 90.2 81.6  MCH 25.6* 25.8*  MCHC 28.4* 31.6  RDW 15.2 15.0  PLT 324 391   Cardiac EnzymesNo results for input(s): TROPONINI in the last 168 hours. No results for input(s): TROPIPOC in the last 168 hours.  BNPNo results for input(s): BNP, PROBNP in the last 168 hours.  DDimer No results for input(s): DDIMER in the last 168 hours. TSH:  Lab Results  Component Value Date   TSH 3.16 06/28/2016   Lipids: Lab Results  Component Value Date   CHOL 182 01/22/2014   HDL 47.20 01/22/2014   LDLCALC 110 (H) 01/22/2014  LDLDIRECT 130.2  12/23/2012   TRIG 126.0 01/22/2014   CHOLHDL 4 01/22/2014   HgbA1c:No results found for: HGBA1C  Radiology/Studies:  CT HEAD WO CONTRAST  Result Date: 01/22/2019 CLINICAL DATA:  Encephalopathy, dizziness EXAM: CT HEAD WITHOUT CONTRAST TECHNIQUE: Contiguous axial images were obtained from the base of the skull through the vertex without intravenous contrast. COMPARISON:  None. FINDINGS: Brain: No evidence of acute infarction, hemorrhage, hydrocephalus, extra-axial collection or mass lesion/mass effect. Symmetric prominence of the ventricles, cisterns and sulci compatible with moderate parenchymal volume loss. Patchy areas of white matter hypoattenuation are most compatible with chronic microvascular angiopathy. Vascular: Atherosclerotic calcification of the carotid siphons and intradural vertebral arteries. No hyperdense vessel. Skull: No calvarial fracture or suspicious osseous lesion. No scalp swelling or hematoma. Sinuses/Orbits: Paranasal sinuses and mastoid air cells are predominantly clear. Orbital structures are unremarkable aside from prior lens extractions. Other: Debris in the left external auditory canal. IMPRESSION: 1. No acute intracranial abnormality. 2. Moderate parenchymal volume loss and chronic microvascular angiopathy. 3. Intracranial atherosclerosis 4. Debris present in the left external auditory canal, correlate with direct visualization to exclude a cerumen impaction. Electronically Signed   By: Lovena Le M.D.   On: 01/22/2019 23:52   DG Chest Port 1 View  Result Date: 01/22/2019 CLINICAL DATA:  86 year old female with increasing weakness. SVT, palpitations. EXAM: PORTABLE CHEST 1 VIEW COMPARISON:  Portable chest 12/13/2018. FINDINGS: Portable AP semi upright view at 2021 hours. Stable lung volumes. Mediastinal contours remain normal. Calcified aortic atherosclerosis. Visualized tracheal air column is within normal limits. Left chest pacer or resuscitation pads. Allowing for  portable technique the lungs are clear. No pneumothorax. Negative visible bowel gas pattern. No acute osseous abnormality identified. IMPRESSION: No acute cardiopulmonary abnormality. Electronically Signed   By: Genevie Ann M.D.   On: 01/22/2019 20:33   Assessment and Plan:   1. SVT: -EMS called by daughter after a brief unresponsive episode at home found to be in SVT per EMS responsive to adenosine x1. Recurrence in the ED for which she was started on metoprolol 25 mg p.o. twice daily with return to NSR -Remains in NSR with rates in the 70-80 range and no recurrence on telemetry monitoring  -Echocardiogram performed however final results are pending at this time  Prior echocardiogram 2016 with normal LV function-and mild aortic insufficiency -Lab work on presentation with moderate anemia with a hemoglobin of 6.5, s/p 1 unit PRBC transfusion with improvement to 9.8 -Potassium found to be very mildly decreased at 3.4>> otherwise lab work unremarkable -Will increase metoprolol to 50mg  BID in the setting of SVT and HTN. Monitor HR closely on telemetry.   2. Unresponsive episode: -Unclear etiology at this time, however could be contributed to SVT episode versus new seizure.  -Neurology consulted  -EEG order placed per IM -Head CT without acute abnormalities   3. Anemia: -Hb on presentation found to be 6.5 with improvement after 1 unit PRBC to 9.8 -Hemoccult stool negative -Given IVF per IM  4. HTN: -Elevated, 166/81, 190/86, 164/70, 180/68 -BP was soft on ED presentation  -Recently placed on low dose amlodipine per PCP after recent ED visit for similar episode -Currently started on metoprolol tartrate 25 mg p.o. twice daily in the setting of SVT>>increase to 50mg  BID   5.  CKD stage III: -Creatinine, 0.93>> 1.16 today with a baseline that appears to be in the 1.2-1.3 range -Continue to monitor closely with daily lab work -Avoid nephrotoxic medications   For questions or updates,  please  contact [place] Please consult [email or web address] for contact info under Cardiology/STEMI.   Lyndel Safe NP-C [facility] Pager: [phone] 01/23/2019 11:56 AM  Patient seen and examined with Kathyrn Drown NP-C.  Agree as above, with the following exceptions and changes as noted below.  Mrs. Glaab is an 86 year old female who presents with an unresponsive episode and SVT.  Per review of the medical record it does not appear that she truly had syncope.I am unable to obtain history from the patient who is quite altered at the moment, trying to crawl out of bed to tell me that she needs to go upstairs or downstairs in her house.  She is pleasant but confused.  Gen: No physical distress, CV: RRR, no murmurs, Lungs: clear, Abd: soft, Extrem: Warm, well perfused, no edema, Neuro/Psych: Altered, thinks she is at home, pleasant.  All available labs, radiology testing, previous records reviewed.    SVT-Mrs. Taddei experienced SVT documented on ECG yesterday, which was initially recognized by EMS.  She also had an episode of urinary incontinence, limb shaking, and staring spell with drooling and reported unresponsiveness.  These episodes do not sound consistent with syncope or malignant arrhythmia.  With SVT particularly if the rate was fast she may have experienced hypotension which could have contributed to her difficulty with mentation during the episode.  She is being independently evaluated by neurology for seizure activity.  Could consider increasing her beta-blocker for hypertension as well as SVT.  Amlodipine may need to be held or reevaluated to avoid hypotension while she is in hospital.  Given the patient's current mental status, we could consider a cardiac monitor as an outpatient, however she may not tolerate this if she is confused.  Altered mental status-with urinary incontinence and altered mental status in an elderly female, I would recommend obtaining a urinalysis with  culture.   Query syncope-I have independently interpreted her echocardiogram and there are no definite findings that would contribute to an unresponsive episode or syncope.   Elouise Munroe 01/23/19 8:25 PM

## 2019-01-24 LAB — BPAM RBC
Blood Product Expiration Date: 202102022359
ISSUE DATE / TIME: 202101140331
Unit Type and Rh: 600

## 2019-01-24 LAB — TYPE AND SCREEN
ABO/RH(D): A NEG
Antibody Screen: NEGATIVE
Unit division: 0

## 2019-01-24 LAB — CBC
HCT: 32.5 % — ABNORMAL LOW (ref 36.0–46.0)
Hemoglobin: 10.3 g/dL — ABNORMAL LOW (ref 12.0–15.0)
MCH: 25.9 pg — ABNORMAL LOW (ref 26.0–34.0)
MCHC: 31.7 g/dL (ref 30.0–36.0)
MCV: 81.9 fL (ref 80.0–100.0)
Platelets: 362 10*3/uL (ref 150–400)
RBC: 3.97 MIL/uL (ref 3.87–5.11)
RDW: 15 % (ref 11.5–15.5)
WBC: 11.2 10*3/uL — ABNORMAL HIGH (ref 4.0–10.5)
nRBC: 0 % (ref 0.0–0.2)

## 2019-01-24 LAB — BASIC METABOLIC PANEL
Anion gap: 12 (ref 5–15)
BUN: 14 mg/dL (ref 8–23)
CO2: 20 mmol/L — ABNORMAL LOW (ref 22–32)
Calcium: 9.1 mg/dL (ref 8.9–10.3)
Chloride: 99 mmol/L (ref 98–111)
Creatinine, Ser: 1.08 mg/dL — ABNORMAL HIGH (ref 0.44–1.00)
GFR calc Af Amer: 53 mL/min — ABNORMAL LOW (ref >60)
GFR calc non Af Amer: 45 mL/min — ABNORMAL LOW (ref >60)
Glucose, Bld: 90 mg/dL (ref 70–99)
Potassium: 4 mmol/L (ref 3.5–5.1)
Sodium: 131 mmol/L — ABNORMAL LOW (ref 135–145)

## 2019-01-24 MED ORDER — ADULT MULTIVITAMIN W/MINERALS CH
1.0000 | ORAL_TABLET | Freq: Every day | ORAL | Status: DC
  Administered 2019-01-24 – 2019-01-28 (×5): 1 via ORAL
  Filled 2019-01-24 (×5): qty 1

## 2019-01-24 MED ORDER — VITAMIN B-12 100 MCG PO TABS
50.0000 ug | ORAL_TABLET | Freq: Every day | ORAL | Status: DC
  Administered 2019-01-24: 50 ug via ORAL
  Filled 2019-01-24: qty 1

## 2019-01-24 MED ORDER — VITAMIN B-12 1000 MCG PO TABS
1000.0000 ug | ORAL_TABLET | Freq: Every day | ORAL | Status: DC
  Administered 2019-01-25 – 2019-01-28 (×4): 1000 ug via ORAL
  Filled 2019-01-24 (×4): qty 1

## 2019-01-24 MED ORDER — METOPROLOL TARTRATE 25 MG PO TABS
25.0000 mg | ORAL_TABLET | Freq: Two times a day (BID) | ORAL | Status: DC
  Administered 2019-01-24 – 2019-01-28 (×8): 25 mg via ORAL
  Filled 2019-01-24 (×8): qty 1

## 2019-01-24 MED ORDER — VITAMIN D 25 MCG (1000 UNIT) PO TABS
5000.0000 [IU] | ORAL_TABLET | Freq: Every day | ORAL | Status: DC
  Administered 2019-01-24 – 2019-01-28 (×5): 5000 [IU] via ORAL
  Filled 2019-01-24 (×5): qty 5

## 2019-01-24 NOTE — Evaluation (Signed)
Physical Therapy Evaluation Patient Details Name: Natalie Fitzgerald MRN: [id number] DOB: 01/27/1927 Today's Date: 01/24/2019   History of Present Illness  86 yo female with onset of unresponsive episode at home with daughter was found to be in SVT at ED.  Had encephalopathy on EEG, transfused and no seizure activity noted.  PMHx:  aortic valve disorder, glaucoma, HTN, CKD  Clinical Impression  Pt was seen for mobility and strength assessment with limitations of endurance, strength, postural control and has some cognitive awareness of PT questions but contradicts herself.  History is likely not fully accurate due to pt recall.  Follow acutely to work toward her return home, but recommending SNF to restore her independence as described to return home.  Follow up with transsfers and balance skills to increase standing and pivoting with and without an AD.    Follow Up Recommendations SNF    Equipment Recommendations  None recommended by PT    Recommendations for Other Services       Precautions / Restrictions Precautions Precautions: Fall Precaution Comments: monitor vitals Restrictions Weight Bearing Restrictions: No Other Position/Activity Restrictions: confusion      Mobility  Bed Mobility Overal bed mobility: Needs Assistance Bed Mobility: Sidelying to Sit;Sit to Sidelying   Sidelying to sit: Mod assist     Sit to sidelying: Min guard General bed mobility comments: pt could not lift off bed without mod assist for trunk but can get back to bed  Transfers Overall transfer level: Needs assistance               General transfer comment: actively resisted trying to stand  Ambulation/Gait             General Gait Details: unable to attempt  Stairs            Wheelchair Mobility    Modified Rankin (Stroke Patients Only)       Balance Overall balance assessment: Needs assistance Sitting-balance support: Feet supported;Bilateral upper extremity  supported Sitting balance-Leahy Scale: Fair                                       Pertinent Vitals/Pain Pain Assessment: No/denies pain    Home Living Family/patient expects to be discharged to:: Private residence Living Arrangements: Children Available Help at Discharge: Family;Available 24 hours/day Type of Home: House Home Access: Stairs to enter   [employer] of Steps: 7   Home Equipment: Walker - 2 wheels;Wheelchair - manual Additional Comments: information from pt but she is contradicting herself about distant history    Prior Function Level of Independence: Needs assistance   Gait / Transfers Assistance Needed: bedbound mainly per pt  ADL's / Homemaking Assistance Needed: daughter cares for pt with assist to dress and bathe        Hand Dominance   Dominant Hand: Right    Extremity/Trunk Assessment   Upper Extremity Assessment Upper Extremity Assessment: Generalized weakness    Lower Extremity Assessment Lower Extremity Assessment: Generalized weakness    Cervical / Trunk Assessment Cervical / Trunk Assessment: Kyphotic  Communication   Communication: HOH  Cognition Arousal/Alertness: Lethargic Behavior During Therapy: Flat affect Overall Cognitive Status: No family/caregiver present to determine baseline cognitive functioning                                 General  Comments: unclear how typical her ability to follow instructions and answer questions is      General Comments General comments (skin integrity, edema, etc.): pt would not try to get to [facility] despite her prior independent transfer skills per pt.      Exercises     Assessment/Plan    PT Assessment Patient needs continued PT services  PT Problem List Decreased strength;Decreased range of motion;Decreased activity tolerance;Decreased balance;Decreased mobility;Decreased coordination;Decreased cognition;Decreased knowledge of use of DME;Decreased  safety awareness;Cardiopulmonary status limiting activity       PT Treatment Interventions DME instruction;Gait training;Functional mobility training;Therapeutic activities;Therapeutic exercise;Balance training;Patient/family education;Neuromuscular re-education    PT Goals (Current goals can be found in the Care Plan section)  Acute Rehab PT Goals Patient Stated Goal: to get back to bed, "Let me do what I do" PT Goal Formulation: Patient unable to participate in goal setting Time For Goal Achievement: 02/07/19 Potential to Achieve Goals: Fair    Frequency Min 2X/week   Barriers to discharge Inaccessible home environment;Decreased caregiver support 2 person help needed and per pt there are stairs in the house    Co-evaluation               AM-PAC PT "6 Clicks" Mobility  Outcome Measure Help needed turning from your back to your side while in a flat bed without using bedrails?: A Lot Help needed moving from lying on your back to sitting on the side of a flat bed without using bedrails?: A Lot Help needed moving to and from a bed to a chair (including a wheelchair)?: Total Help needed standing up from a chair using your arms (e.g., wheelchair or bedside chair)?: Total Help needed to walk in hospital room?: Total Help needed climbing 3-5 steps with a railing? : Total 6 Click Score: 8    End of Session   Activity Tolerance: Patient limited by fatigue;Treatment limited secondary to medical complications (Comment) Patient left: in bed;with call bell/phone within reach;with bed alarm set Nurse Communication: Mobility status PT Visit Diagnosis: Muscle weakness (generalized) (M62.81);Other abnormalities of gait and mobility (R26.89);Difficulty in walking, not elsewhere classified (R26.2)    Time: [id number] PT Time Calculation (min) (ACUTE ONLY): 16 min   Charges:   PT Evaluation $PT Eval Moderate Complexity: 1 Mod         Ramond Dial 01/24/2019, 11:35 AM   Mee Hives, PT MS Acute Rehab Dept. Number: [place]

## 2019-01-24 NOTE — TOC Initial Note (Signed)
Transition of Care [facility]) - Initial/Assessment Note    Patient Details  Name: Natalie Fitzgerald MRN: [id number] Date of Birth: 1927-06-17  Transition of Care [facility]) CM/SW Contact:    Sable Feil, LCSW Phone Number: 01/24/2019, 3:56 PM  Clinical Narrative:  Talked with [name] [id number]), POA (paperwork provided to be placed in patient's chart) regarding discharge disposition for her mother and recommendation from PT for [place] rehab, and discharge disposition for her mother. When asked, Natalie Fitzgerald responded that her mom lives with her and has never been to SNF for ST rehab, but her brother has been to [facility] for rehab.  She also reported that her mom has had rehab at home earlier this year. CSW explained the facility search process and she expressed agreement with rehab for her mom, adding that she wants her mom to be able to use her rolling walker. Daughter also given information about using Medicare.gov to research SNF's. Natalie Fitzgerald added that her mom is a big fall risk and she normally walks behind her at home. Daughter explained that she is on disability and can't move her mom about or physically assist in the shower. Natalie Fitzgerald added that she has macular degeneration and does not drive at night. Daughter reported that her mom has the following DME: rolling walker with seat, potty chair, and shower bench.    Natalie Fitzgerald expressed agreement with ST rehab, however does have concerns about not being able to visit her mother in the facility.  Daughter added that she pampers and has spoiled her mother rotten and is concerned that because she anticipates her mother's needs, her mother won't ask for what she needs at the skilled facility and this was discussed.   Natalie Fitzgerald talked about her family (brother and sister) and added that her mom stayed with her brother for a period of time before moving in with her. Daughter added that her brother has health issues and she also  helps him out and a friend will come to her home and sit with her mother. Per Natalie Fitzgerald, her sister recently had surgery for uterine cancer and is recuperating.                    Expected Discharge Plan: Skilled Nursing Facility Barriers to Discharge: Continued Medical Work up   Patient Goals and CMS Choice Patient states their goals for this hospitalization and ongoing recovery are:: Daughter wants her mother to get medically better and is in agreement with ST rehab once medically stable. CMS Medicare.gov Compare Post Acute Care list provided to:: Patient Represenative (must comment)(Daughter provided with information re: [email or web address]) Choice offered to / list presented to : Adult Children([name])  Expected Discharge Plan and Services Expected Discharge Plan: [place] In-house Referral: Clinical Social Work Discharge Planning Services: Other - See comment(SNF Placement)   Living arrangements for the past 2 months: Single Family Home(Patient lives with [name])                                      Prior Living Arrangements/Services Living arrangements for the past 2 months: Single Family Home(Patient lives with [name]) Lives with:: Adult Children Patient language and need for interpreter reviewed:: No Do you feel safe going back to the place where you live?: No   Daughter agreeable to [place] rehab  Need for Family Participation in Patient Care:  Yes (Comment) Care giver support system in place?: Yes (comment)   Criminal Activity/Legal Involvement Pertinent to Current Situation/Hospitalization: No - Comment as needed  Activities of Daily Living      Permission Sought/Granted Permission sought to share information with : (Permission not sought from patient as oriented to person/place only) Permission granted to share information with : No              Emotional Assessment Appearance:: Other (Comment Required(Did not visit with  patient, talked with daughter by phone) Attitude/Demeanor/Rapport: Unable to Assess Affect (typically observed): Unable to Assess Orientation: : Oriented to Self, Oriented to Place Alcohol / Substance Use: Never Used Psych Involvement: No (comment)  Admission diagnosis:  SVT (supraventricular tachycardia) (HCC) [I47.1] Unresponsive episode [R41.89] Anemia, unspecified type [D64.9] Patient Active Problem List   Diagnosis Date Noted  . SVT (supraventricular tachycardia) ([place]) 01/22/2019  . Unresponsive episode 01/22/2019  . Anemia 01/22/2019  . DNR no code (do not resuscitate) 12/24/2018  . Unstable gait 06/25/2017  . CKD (chronic kidney disease), stage III 06/25/2017  . Aortic insufficiency 01/14/2014  . Hypertension 09/13/2010  . Left breast mass, uoq 07/19/2010  . SEBORRHEIC KERATOSIS 11/27/2008  . LOW BACK PAIN 01/24/2007  . OSTEOPENIA 01/24/2007  . Hyperlipemia 01/09/2007  . Allergic rhinitis 01/09/2007   PCP:  Martinique, Betty G, MD Pharmacy:   [facility] [phone] [place], [place] - [place] [address] [place] [address] Phone: [phone] Fax: [phone]  [facility] [phone] [place], [place] - [place] [address] [place] [address] [place] [address] Phone: [phone] Fax: [phone]     Social Determinants of Health (SDOH) Interventions  No SDOH interventions needed at this time.  Readmission Risk Interventions No flowsheet data found.

## 2019-01-24 NOTE — Progress Notes (Signed)
Patient's daughter at the bedside,updated her what up for her mother's condition and plan.

## 2019-01-24 NOTE — Progress Notes (Signed)
Patient BP 193/75, HR 77. Natalie Corpus, NP paged. No new orders given.

## 2019-01-24 NOTE — Progress Notes (Signed)
Progress Note    Natalie Fitzgerald  [id number] DOB: [birth year]  DOA: 01/22/2019 PCP: Martinique, Betty G, MD    Brief Narrative:     Medical records reviewed and are as summarized below:  Natalie Fitzgerald is an 86 y.o. female with history of recently diagnosed hypertension anemia was brought to the ER after patient had an unresponsive episode and later was found to be in SVT.  Per daughter patient had a similar episode about 6 weeks ago and was taken to the ER at [facility] where patient was observed and discharged home.  At that time patient had sudden stiffening lasted for few minutes and resolved without any incontinence of urine or bowel or confusion.  Today patient was going to move to another seat when patient suddenly became unresponsive and was having generalized shaking mostly on one extremity.  Patient remained unconscious for at least 3 to 4 minutes.  EMS arrived and was found to be in SVT and per EMS report per the ER physician patient was easily arousable and answers questions.   Assessment/Plan:   Principal Problem:   Unresponsive episode Active Problems:   Hypertension   Aortic insufficiency   CKD (chronic kidney disease), stage III   DNR no code (do not resuscitate)   SVT (supraventricular tachycardia) (HCC)   Anemia   Unresponsive episode  -cause not clear--suspect orthostatic (have been ordered but  vs SVT episode  -EEG: study is suggestive of mild to moderate diffuse encephalopathy, nonspecific etiology. No seizures or epileptiform discharges were seen throughout the recording. -PT eval -urinalysis pending-- no urinary complaints  SVT  -responded to adenosine at the field.   -1 dose of IV metoprolol was given in the ER after a second episode happened -TSH ok -2D echo: Left ventricular ejection fraction, by visual estimation, is 65%.  -cardiology consult appreciated -BB started  Anemia -1 unit of PRBC transfusion give with increase from 6.5 to  9.8 -suspect this was incorrect  Hypertension - recently started on amlodipine by patient's primary care physician after the recent visit in the ER. -d/c this and start BB      Family Communication/Anticipated D/C date and plan/Code Status   DVT prophylaxis: heparin Code Status: dnr  Family Communication:  Disposition Plan: PT eval ordered-- suspect will need placement (per report has been living with daughter)-- appears to have had a recent appointment with [place] care but note not complete   Medical Consultants:    cards     Subjective:   Telling me about her daughter's cat that can ring the doorbell  Objective:    Vitals:   01/24/19 0103 01/24/19 0520 01/24/19 0654 01/24/19 0818  BP: (!) 193/75 (!) 182/67 (!) 159/70 (!) 167/58  Pulse: 77 75 79 66  Resp:  20  18  Temp:  98.2 F (36.8 C)  98.2 F (36.8 C)  TempSrc:  Oral  Oral  SpO2:  94%  99%  Weight:        Intake/Output Summary (Last 24 hours) at 01/24/2019 1119 Last data filed at 01/24/2019 0805 Gross per 24 hour  Intake 180 ml  Output 1050 ml  Net -870 ml   Filed Weights   01/23/19 0122 01/23/19 0353 01/23/19 2146  Weight: 58.2 kg 58.6 kg 59.6 kg    Exam: In bed, frail appearing rrr- tele reviewed, no episodes of SVT/fib No increased work of breathing Min LE edema    Data Reviewed:   I have personally  reviewed following labs and imaging studies:  Labs: Labs show the following:   Basic Metabolic Panel: Recent Labs  Lab 01/22/19 2053 01/22/19 2053 01/23/19 1022 01/24/19 0354  NA 136  --  137 131*  K 3.4*   < > 4.6 4.0  CL 113*  --  102 99  CO2 15*  --  23 20*  GLUCOSE 103*  --  102* 90  BUN 13  --  16 14  CREATININE 0.93  --  1.16* 1.08*  CALCIUM 7.1*  --  9.4 9.1   < > = values in this interval not displayed.   GFR Estimated Creatinine Clearance: 30.5 mL/min (A) (by C-G formula based on SCr of 1.08 mg/dL (H)). Liver Function Tests: Recent Labs  Lab  01/23/19 1022  AST 30  ALT 16  ALKPHOS 84  BILITOT 1.3*  PROT 6.5  ALBUMIN 2.9*   No results for input(s): LIPASE, AMYLASE in the last 168 hours. No results for input(s): AMMONIA in the last 168 hours. Coagulation profile No results for input(s): INR, PROTIME in the last 168 hours.  CBC: Recent Labs  Lab 01/22/19 2053 01/23/19 1022 01/24/19 0354  WBC 12.3* 11.9* 11.2*  NEUTROABS 10.4* 8.9*  --   HGB 6.5* 9.8* 10.3*  HCT 22.9* 31.0* 32.5*  MCV 90.2 81.6 81.9  PLT 324 391 362   Cardiac Enzymes: No results for input(s): CKTOTAL, CKMB, CKMBINDEX, TROPONINI in the last 168 hours. BNP (last 3 results) No results for input(s): PROBNP in the last 8760 hours. CBG: No results for input(s): GLUCAP in the last 168 hours. D-Dimer: No results for input(s): DDIMER in the last 72 hours. Hgb A1c: No results for input(s): HGBA1C in the last 72 hours. Lipid Profile: No results for input(s): CHOL, HDL, LDLCALC, TRIG, CHOLHDL, LDLDIRECT in the last 72 hours. Thyroid function studies: Recent Labs    01/23/19 1022  TSH 3.958   Anemia work up: Recent Labs    01/23/19 1022  VITAMINB12 268  FOLATE 11.8  FERRITIN 26  TIBC 322  IRON 285*  RETICCTPCT 1.4   Sepsis Labs: Recent Labs  Lab 01/22/19 2053 01/23/19 1022 01/24/19 0354  WBC 12.3* 11.9* 11.2*    Microbiology Recent Results (from the past 240 hour(s))  SARS CORONAVIRUS 2 (TAT 6-24 HRS) Nasopharyngeal Nasopharyngeal Swab     Status: None   Collection Time: 01/22/19 10:09 PM   Specimen: Nasopharyngeal Swab  Result Value Ref Range Status   SARS Coronavirus 2 NEGATIVE NEGATIVE Final    Comment: (NOTE) SARS-CoV-2 target nucleic acids are NOT DETECTED. The SARS-CoV-2 RNA is generally detectable in upper and lower respiratory specimens during the acute phase of infection. Negative results do not preclude SARS-CoV-2 infection, do not rule out co-infections with other pathogens, and should not be used as the sole basis  for treatment or other patient management decisions. Negative results must be combined with clinical observations, patient history, and epidemiological information. The expected result is Negative. Fact Sheet for Patients: SugarRoll.be Fact Sheet for Healthcare Providers: https://www.woods-mathews.com/ This test is not yet approved or cleared by the [place] FDA and  has been authorized for detection and/or diagnosis of SARS-CoV-2 by FDA under an Emergency Use Authorization (EUA). This EUA will remain  in effect (meaning this test can be used) for the duration of the COVID-19 declaration under Section 56 4(b)(1) of the Act, 21 U.S.C. section 360bbb-3(b)(1), unless the authorization is terminated or revoked sooner. Performed at [facility], [place]  [address], [place], [place] [address]     Procedures and diagnostic studies:  EEG  Result Date: 01/23/2019 Lora Havens, MD     01/23/2019  1:00 PM Patient Name: Natalie Fitzgerald MRN: [id number] Epilepsy Attending: Lora Havens Referring Physician/Provider: Dr. Gean Birchwood Date: 01/23/2019 Duration: 25.20 minutes Patient history: 86 year old female who presented with an episode of transient alteration of awareness.  EEG to evaluate for seizures. Level of alertness: Awake AEDs during EEG study: None Technical aspects: This EEG study was done with scalp electrodes positioned according to the 10-20 International system of electrode placement. Electrical activity was acquired at a sampling rate of 500Hz  and reviewed with a high frequency filter of 70Hz  and a low frequency filter of 1Hz . EEG data were recorded continuously and digitally stored. Description: EEG showed continuous generalized 3 to 6 Hz theta-delta slowing.  No clear posterior dominant rhythm was seen.  Hyperventilation and photic summation were not performed. Abnormalities -Continuous slow, generalized IMPRESSION: This study is  suggestive of mild to moderate diffuse encephalopathy, nonspecific etiology. No seizures or epileptiform discharges were seen throughout the recording.   CT HEAD WO CONTRAST  Result Date: 01/22/2019 CLINICAL DATA:  Encephalopathy, dizziness EXAM: CT HEAD WITHOUT CONTRAST TECHNIQUE: Contiguous axial images were obtained from the base of the skull through the vertex without intravenous contrast. COMPARISON:  None. FINDINGS: Brain: No evidence of acute infarction, hemorrhage, hydrocephalus, extra-axial collection or mass lesion/mass effect. Symmetric prominence of the ventricles, cisterns and sulci compatible with moderate parenchymal volume loss. Patchy areas of white matter hypoattenuation are most compatible with chronic microvascular angiopathy. Vascular: Atherosclerotic calcification of the carotid siphons and intradural vertebral arteries. No hyperdense vessel. Skull: No calvarial fracture or suspicious osseous lesion. No scalp swelling or hematoma. Sinuses/Orbits: Paranasal sinuses and mastoid air cells are predominantly clear. Orbital structures are unremarkable aside from prior lens extractions. Other: Debris in the left external auditory canal. IMPRESSION: 1. No acute intracranial abnormality. 2. Moderate parenchymal volume loss and chronic microvascular angiopathy. 3. Intracranial atherosclerosis 4. Debris present in the left external auditory canal, correlate with direct visualization to exclude a cerumen impaction. Electronically Signed   By: Lovena Le M.D.   On: 01/22/2019 23:52   DG Chest Port 1 View  Result Date: 01/22/2019 CLINICAL DATA:  86 year old female with increasing weakness. SVT, palpitations. EXAM: PORTABLE CHEST 1 VIEW COMPARISON:  Portable chest 12/13/2018. FINDINGS: Portable AP semi upright view at 2021 hours. Stable lung volumes. Mediastinal contours remain normal. Calcified aortic atherosclerosis. Visualized tracheal air column is within normal limits. Left chest pacer or  resuscitation pads. Allowing for portable technique the lungs are clear. No pneumothorax. Negative visible bowel gas pattern. No acute osseous abnormality identified. IMPRESSION: No acute cardiopulmonary abnormality. Electronically Signed   By: Genevie Ann M.D.   On: 01/22/2019 20:33   ECHOCARDIOGRAM COMPLETE  Result Date: 01/23/2019   ECHOCARDIOGRAM REPORT   Patient Name:   Natalie Fitzgerald Marney Date of Exam: 01/23/2019 Medical Rec #:  [id number]       Height:       64.0 in Accession #:    [id number]      Weight:       129.2 lb Date of Birth:  1927-03-31      BSA:          1.62 m Patient Age:    89 years        BP:           190/86 mmHg Patient Gender: F  HR:           74 bpm. Exam Location:  Inpatient Procedure: 2D Echo                                 MODIFIED REPORT:     This report was modified by Cherlynn Kaiser MD on 01/23/2019 due to syngo                                 template error.  Indications:     Syncope 780.2 / R55  History:         Patient has prior history of Echocardiogram examinations, most                  recent 01/21/2014. Arrythmias:SVT; Risk Factors:Dyslipidemia and                  Hypertension.  Sonographer:     Jaquita Folds Referring Phys:  [place] Diagnosing Phys: Cherlynn Kaiser MD IMPRESSIONS  1. Left ventricular ejection fraction, by visual estimation, is 65%. The left ventricle has normal function. There is mildly increased left ventricular hypertrophy. Angulation of the aorta without significant LVOT flow acceleration, no evidence of outflow tract obstruction.  2. Left ventricular diastolic parameters are consistent with Grade I diastolic dysfunction (impaired relaxation).  3. Global right ventricle has normal systolic function.The right ventricular size is normal. No increase in right ventricular wall thickness.  4. Left atrial size was mild-moderately dilated.  5. The mitral valve is normal in structure. Mild mitral valve regurgitation. No evidence of  mitral stenosis.  6. The tricuspid valve is normal in structure. Trivial TR.  7. The aortic valve is normal in structure. Aortic valve regurgitation is mild to moderate. No evidence of aortic valve sclerosis or stenosis.  8. The pulmonic valve was not well visualized. Pulmonic valve regurgitation is not visualized.  9. TR signal is inadequate for assessing pulmonary artery systolic pressure. 10. The inferior vena cava is normal in size with greater than 50% respiratory variability, suggesting right atrial pressure of 3 mmHg. FINDINGS  Left Ventricle: Left ventricular ejection fraction, by visual estimation, is 65%. The left ventricle has normal function. The left ventricle has no regional wall motion abnormalities. There is mildly increased left ventricular hypertrophy. Left ventricular diastolic parameters are consistent with Grade I diastolic dysfunction (impaired relaxation). Indeterminate filling pressures. Right Ventricle: The right ventricular size is normal. No increase in right ventricular wall thickness. Global RV systolic function is has normal systolic function. Left Atrium: Left atrial size was mild-moderately dilated. Right Atrium: Right atrial size was normal in size Pericardium: There is no evidence of pericardial effusion. Presence of pericardial fat pad. Mitral Valve: The mitral valve is normal in structure. Mild mitral valve regurgitation. No evidence of mitral valve stenosis by observation. Tricuspid Valve: The tricuspid valve is normal in structure. Tricuspid valve regurgitation is trivial. Aortic Valve: The aortic valve is normal in structure. Aortic valve regurgitation is mild to moderate. Aortic regurgitation PHT measures 465 msec. The aortic valve is structurally normal, with no evidence of sclerosis or stenosis. There is mild calcification of the aortic valve. Pulmonic Valve: The pulmonic valve was not well visualized. Pulmonic valve regurgitation is not visualized. Pulmonic regurgitation is  not visualized. Aorta: The aortic root is normal in size and structure and the aortic arch was  not well visualized. Venous: The inferior vena cava is normal in size with greater than 50% respiratory variability, suggesting right atrial pressure of 3 mmHg. IAS/Shunts: No atrial level shunt detected by color flow Doppler. There is no evidence of a patent foramen ovale. There is no evidence of an atrial septal defect.  LEFT VENTRICLE PLAX 2D LVIDd:         3.20 cm  Diastology LVIDs:         2.50 cm  LV e' lateral:   6.74 cm/s LV PW:         1.20 cm  LV E/e' lateral: 11.6 LV IVS:        1.10 cm  LV e' medial:    5.33 cm/s LVOT diam:     1.80 cm  LV E/e' medial:  14.7 LV SV:         19 ml LV SV Index:   11.43 LVOT Area:     2.54 cm  RIGHT VENTRICLE RV S prime:     12.10 cm/s TAPSE (M-mode): 1.6 cm LEFT ATRIUM             Index LA diam:        3.90 cm 2.40 cm/m LA Vol (A2C):   62.9 ml 38.71 ml/m LA Vol (A4C):   61.3 ml 37.73 ml/m LA Biplane Vol: 65.9 ml 40.56 ml/m  AORTIC VALVE LVOT Vmax:   117.00 cm/s LVOT Vmean:  78.400 cm/s LVOT VTI:    0.235 m AI PHT:      465 msec  AORTA Ao Root diam: 2.80 cm MITRAL VALVE MV Area (PHT): 3.27 cm              SHUNTS MV PHT:        67.28 msec            Systemic VTI:  0.24 m MV Decel Time: 232 msec              Systemic Diam: 1.80 cm MV E velocity: 78.20 cm/s  103 cm/s MV A velocity: 103.00 cm/s 70.3 cm/s MV E/A ratio:  0.76        1.5  Cherlynn Kaiser MD Electronically signed by Cherlynn Kaiser MD Signature Date/Time: 01/23/2019/1:35:34 PM    Final (Updated)     Medications:   . cholecalciferol  5,000 Units Oral Q breakfast  . heparin  5,000 Units Subcutaneous Q8H  . metoprolol tartrate  50 mg Oral BID  . multivitamin with minerals  1 tablet Oral Q breakfast  . vitamin B-12  50 mcg Oral Q breakfast   Continuous Infusions:   LOS: 1 day   Geradine Girt  [facility]   How to contact the [facility] Attending or Consulting provider [place] or covering provider  during after hours [place], for this patient?  1. Check the care team in [facility] and look for a) attending/consulting [facility] provider listed and b) the [facility] team listed 2. Log into [email or web address] and use [place] universal password to access. If you do not have the password, please contact the hospital operator. 3. Locate the [facility] provider you are looking for under [facility] and page to a number that you can be directly reached. 4. If you still have difficulty reaching the provider, please page the [facility] (Director on Call) for the Hospitalists listed on amion for assistance.  01/24/2019, 11:19 AM

## 2019-01-24 NOTE — Progress Notes (Addendum)
Progress Note  Patient Name: Natalie Fitzgerald Date of Encounter: 01/24/2019  Primary Cardiologist: Quay Burow, MD   Subjective   Natalie Fitzgerald is alert today. She did not know where she is or why she is here but is able to recall Dr. Gwenlyn Fitzgerald and says "He's my go-to man". She says that she is not surprised that she was unconscious yesterday. She is surprised that she was anemic as she says she eats all the time.  She denies chest pain, shortness of breath or palpitations. She says that she only very tired all the time.   Inpatient Medications    Scheduled Meds: . cholecalciferol  5,000 Units Oral Q breakfast  . heparin  5,000 Units Subcutaneous Q8H  . metoprolol tartrate  50 mg Oral BID  . multivitamin with minerals  1 tablet Oral Q breakfast  . vitamin B-12  50 mcg Oral Q breakfast   Continuous Infusions:  PRN Meds: acetaminophen **OR** acetaminophen, ondansetron **OR** ondansetron (ZOFRAN) IV   Vital Signs    Vitals:   01/24/19 0103 01/24/19 0520 01/24/19 0654 01/24/19 0818  BP: (!) 193/75 (!) 182/67 (!) 159/70 (!) 167/58  Pulse: 77 75 79 66  Resp:  20  18  Temp:  98.2 F (36.8 C)  98.2 F (36.8 C)  TempSrc:  Oral  Oral  SpO2:  94%  99%  Weight:        Intake/Output Summary (Last 24 hours) at 01/24/2019 1116 Last data filed at 01/24/2019 0805 Gross per 24 hour  Intake 180 ml  Output 1050 ml  Net -870 ml   Last 3 Weights 01/23/2019 01/23/2019 01/23/2019  Weight (lbs) 131 lb 6.3 oz 129 lb 3 oz 128 lb 4.9 oz  Weight (kg) 59.6 kg 58.6 kg 58.2 kg      Telemetry    SB/SR 1st AVB. Rates 58-70's - Personally Reviewed  ECG    No new tracings for review  Physical Exam   GEN: No acute distress.   Neck: No JVD Cardiac: RRR, no murmurs, rubs, or gallops.  Respiratory: Clear to auscultation bilaterally. GI: Soft, nontender, non-distended  Natalie: No edema; No deformity. Neuro:  Alert, oriented to self, not place or time Psych: Normal affect   Labs    High  Sensitivity Troponin:   Recent Labs  Lab 01/23/19 0736  TROPONINIHS 186*      Chemistry Recent Labs  Lab 01/22/19 2053 01/23/19 1022 01/24/19 0354  NA 136 137 131*  K 3.4* 4.6 4.0  CL 113* 102 99  CO2 15* 23 20*  GLUCOSE 103* 102* 90  BUN 13 16 14   CREATININE 0.93 1.16* 1.08*  CALCIUM 7.1* 9.4 9.1  PROT  --  6.5  --   ALBUMIN  --  2.9*  --   AST  --  30  --   ALT  --  16  --   ALKPHOS  --  84  --   BILITOT  --  1.3*  --   GFRNONAA 54* 42* 45*  GFRAA >60 48* 53*  ANIONGAP 8 12 12      Hematology Recent Labs  Lab 01/22/19 2053 01/23/19 1022 01/24/19 0354  WBC 12.3* 11.9* 11.2*  RBC 2.54* 3.80*  3.90 3.97  HGB 6.5* 9.8* 10.3*  HCT 22.9* 31.0* 32.5*  MCV 90.2 81.6 81.9  MCH 25.6* 25.8* 25.9*  MCHC 28.4* 31.6 31.7  RDW 15.2 15.0 15.0  PLT 324 391 362    BNPNo results for input(s): BNP, PROBNP  in the last 168 hours.   DDimer No results for input(s): DDIMER in the last 168 hours.   Radiology    EEG  Result Date: 01/23/2019 Natalie Havens, MD     01/23/2019  1:00 PM Patient Name: Natalie Fitzgerald MRN: [id number] Epilepsy Attending: Lora Fitzgerald Referring Physician/Provider: Dr. Gean Birchwood Date: 01/23/2019 Duration: 25.20 minutes Patient history: 86 year old female who presented with an episode of transient alteration of awareness.  EEG to evaluate for seizures. Level of alertness: Awake AEDs during EEG study: None Technical aspects: This EEG study was done with scalp electrodes positioned according to the 10-20 International system of electrode placement. Electrical activity was acquired at a sampling rate of 500Hz  and reviewed with a high frequency filter of 70Hz  and a low frequency filter of 1Hz . EEG data were recorded continuously and digitally stored. Description: EEG showed continuous generalized 3 to 6 Hz theta-delta slowing.  No clear posterior dominant rhythm was seen.  Hyperventilation and photic summation were not performed. Abnormalities  -Continuous slow, generalized IMPRESSION: This study is suggestive of mild to moderate diffuse encephalopathy, nonspecific etiology. No seizures or epileptiform discharges were seen throughout the recording.   CT HEAD WO CONTRAST  Result Date: 01/22/2019 CLINICAL DATA:  Encephalopathy, dizziness EXAM: CT HEAD WITHOUT CONTRAST TECHNIQUE: Contiguous axial images were obtained from the base of the skull through the vertex without intravenous contrast. COMPARISON:  None. FINDINGS: Brain: No evidence of acute infarction, hemorrhage, hydrocephalus, extra-axial collection or mass lesion/mass effect. Symmetric prominence of the ventricles, cisterns and sulci compatible with moderate parenchymal volume loss. Patchy areas of white matter hypoattenuation are most compatible with chronic microvascular angiopathy. Vascular: Atherosclerotic calcification of the carotid siphons and intradural vertebral arteries. No hyperdense vessel. Skull: No calvarial fracture or suspicious osseous lesion. No scalp swelling or hematoma. Sinuses/Orbits: Paranasal sinuses and mastoid air cells are predominantly clear. Orbital structures are unremarkable aside from prior lens extractions. Other: Debris in the left external auditory canal. IMPRESSION: 1. No acute intracranial abnormality. 2. Moderate parenchymal volume loss and chronic microvascular angiopathy. 3. Intracranial atherosclerosis 4. Debris present in the left external auditory canal, correlate with direct visualization to exclude a cerumen impaction. Electronically Signed   By: Lovena Le M.D.   On: 01/22/2019 23:52   DG Chest Port 1 View  Result Date: 01/22/2019 CLINICAL DATA:  86 year old female with increasing weakness. SVT, palpitations. EXAM: PORTABLE CHEST 1 VIEW COMPARISON:  Portable chest 12/13/2018. FINDINGS: Portable AP semi upright view at 2021 hours. Stable lung volumes. Mediastinal contours remain normal. Calcified aortic atherosclerosis. Visualized tracheal air  column is within normal limits. Left chest pacer or resuscitation pads. Allowing for portable technique the lungs are clear. No pneumothorax. Negative visible bowel gas pattern. No acute osseous abnormality identified. IMPRESSION: No acute cardiopulmonary abnormality. Electronically Signed   By: Genevie Ann M.D.   On: 01/22/2019 20:33   ECHOCARDIOGRAM COMPLETE  Result Date: 01/23/2019   ECHOCARDIOGRAM REPORT   Patient Name:   ALAYNA SVEUM Ziebell Date of Exam: 01/23/2019 Medical Rec #:  [id number]       Height:       64.0 in Accession #:    [id number]      Weight:       129.2 lb Date of Birth:  [birth year]      BSA:          1.62 m Patient Age:    25 years        BP:  190/86 mmHg Patient Gender: F               HR:           74 bpm. Exam Location:  Inpatient Procedure: 2D Echo                                 MODIFIED REPORT:     This report was modified by Cherlynn Kaiser MD on 01/23/2019 due to syngo                                 template error.  Indications:     Syncope 780.2 / R55  History:         Patient has prior history of Echocardiogram examinations, most                  recent 01/21/2014. Arrythmias:SVT; Risk Factors:Dyslipidemia and                  Hypertension.  Sonographer:     Jaquita Folds Referring Phys:  [place] Diagnosing Phys: Cherlynn Kaiser MD IMPRESSIONS  1. Left ventricular ejection fraction, by visual estimation, is 65%. The left ventricle has normal function. There is mildly increased left ventricular hypertrophy. Angulation of the aorta without significant LVOT flow acceleration, no evidence of outflow tract obstruction.  2. Left ventricular diastolic parameters are consistent with Grade I diastolic dysfunction (impaired relaxation).  3. Global right ventricle has normal systolic function.The right ventricular size is normal. No increase in right ventricular wall thickness.  4. Left atrial size was mild-moderately dilated.  5. The mitral valve is normal in  structure. Mild mitral valve regurgitation. No evidence of mitral stenosis.  6. The tricuspid valve is normal in structure. Trivial TR.  7. The aortic valve is normal in structure. Aortic valve regurgitation is mild to moderate. No evidence of aortic valve sclerosis or stenosis.  8. The pulmonic valve was not well visualized. Pulmonic valve regurgitation is not visualized.  9. TR signal is inadequate for assessing pulmonary artery systolic pressure. 10. The inferior vena cava is normal in size with greater than 50% respiratory variability, suggesting right atrial pressure of 3 mmHg. FINDINGS  Left Ventricle: Left ventricular ejection fraction, by visual estimation, is 65%. The left ventricle has normal function. The left ventricle has no regional wall motion abnormalities. There is mildly increased left ventricular hypertrophy. Left ventricular diastolic parameters are consistent with Grade I diastolic dysfunction (impaired relaxation). Indeterminate filling pressures. Right Ventricle: The right ventricular size is normal. No increase in right ventricular wall thickness. Global RV systolic function is has normal systolic function. Left Atrium: Left atrial size was mild-moderately dilated. Right Atrium: Right atrial size was normal in size Pericardium: There is no evidence of pericardial effusion. Presence of pericardial fat pad. Mitral Valve: The mitral valve is normal in structure. Mild mitral valve regurgitation. No evidence of mitral valve stenosis by observation. Tricuspid Valve: The tricuspid valve is normal in structure. Tricuspid valve regurgitation is trivial. Aortic Valve: The aortic valve is normal in structure. Aortic valve regurgitation is mild to moderate. Aortic regurgitation PHT measures 465 msec. The aortic valve is structurally normal, with no evidence of sclerosis or stenosis. There is mild calcification of the aortic valve. Pulmonic Valve: The pulmonic valve was not well visualized. Pulmonic valve  regurgitation is not visualized. Pulmonic  regurgitation is not visualized. Aorta: The aortic root is normal in size and structure and the aortic arch was not well visualized. Venous: The inferior vena cava is normal in size with greater than 50% respiratory variability, suggesting right atrial pressure of 3 mmHg. IAS/Shunts: No atrial level shunt detected by color flow Doppler. There is no evidence of a patent foramen ovale. There is no evidence of an atrial septal defect.  LEFT VENTRICLE PLAX 2D LVIDd:         3.20 cm  Diastology LVIDs:         2.50 cm  LV e' lateral:   6.74 cm/s LV PW:         1.20 cm  LV E/e' lateral: 11.6 LV IVS:        1.10 cm  LV e' medial:    5.33 cm/s LVOT diam:     1.80 cm  LV E/e' medial:  14.7 LV SV:         19 ml LV SV Index:   11.43 LVOT Area:     2.54 cm  RIGHT VENTRICLE RV S prime:     12.10 cm/s TAPSE (M-mode): 1.6 cm LEFT ATRIUM             Index LA diam:        3.90 cm 2.40 cm/m LA Vol (A2C):   62.9 ml 38.71 ml/m LA Vol (A4C):   61.3 ml 37.73 ml/m LA Biplane Vol: 65.9 ml 40.56 ml/m  AORTIC VALVE LVOT Vmax:   117.00 cm/s LVOT Vmean:  78.400 cm/s LVOT VTI:    0.235 m AI PHT:      465 msec  AORTA Ao Root diam: 2.80 cm MITRAL VALVE MV Area (PHT): 3.27 cm              SHUNTS MV PHT:        67.28 msec            Systemic VTI:  0.24 m MV Decel Time: 232 msec              Systemic Diam: 1.80 cm MV E velocity: 78.20 cm/s  103 cm/s MV A velocity: 103.00 cm/s 70.3 cm/s MV E/A ratio:  0.76        1.5  Cherlynn Kaiser MD Electronically signed by Cherlynn Kaiser MD Signature Date/Time: 01/23/2019/1:35:34 PM    Final (Updated)     Cardiac Studies   Echocardiogram 01/23/2019  1. Left ventricular ejection fraction, by visual estimation, is 65%. The left ventricle has normal function. There is mildly increased left ventricular hypertrophy. Angulation of the aorta without significant LVOT flow acceleration, no evidence of  outflow tract obstruction.  2. Left ventricular diastolic  parameters are consistent with Grade I diastolic dysfunction (impaired relaxation).  3. Global right ventricle has normal systolic function.The right ventricular size is normal. No increase in right ventricular wall thickness.  4. Left atrial size was mild-moderately dilated.  5. The mitral valve is normal in structure. Mild mitral valve regurgitation. No evidence of mitral stenosis.  6. The tricuspid valve is normal in structure. Trivial TR.  7. The aortic valve is normal in structure. Aortic valve regurgitation is mild to moderate. No evidence of aortic valve sclerosis or stenosis.  8. The pulmonic valve was not well visualized. Pulmonic valve regurgitation is not visualized.  9. TR signal is inadequate for assessing pulmonary artery systolic pressure. 10. The inferior vena cava is normal in size with greater than 50% respiratory variability, suggesting  right atrial pressure of 3 mmHg.  Echocardiogram 01/21/2014: Study Conclusions - Left ventricle: The cavity size was normal. Wall thickness was  normal. Systolic function was normal. The estimated ejection  fraction was in the range of 60% to 65%. Doppler parameters are  consistent with abnormal left ventricular relaxation (grade 1  diastolic dysfunction).  - Aortic valve: There was mild regurgitation.  - Atrial septum: No defect or patent foramen ovale was identified  Patient Profile     86 y.o. female with a hx of borderline hypertension not on antihypertensives, mild aortic insufficiency and CKD stage III per echocardiogram 01/21/2014 with normal LV who presented to [facility] on 01/23/2019 after an unresponsive episode, later Fitzgerald to be in SVT.  She was also anemic with hemoglobin of 6.5 and transfused.  Assessment & Plan    SVT -Patient presented after unresponsive episode with SVT per EMS, responsive to adenosine x1.  Recurrence of SVT in the ER for which he was started on metoprolol 25 mg p.o. with return to normal sinus  rhythm. -Echocardiogram showed normal LV systolic function with EF 65%, mildly increased LVH, angulation of the aorta without significant LVOT flow acceleration, no evidence of outflow tract obstruction, mild mitral regurgitation, mild-moderate aortic regurgitation with no stenosis. -Has been increased to 50 mg twice daily. She is unlikely to tolerate any further increases in BB dose as her HR is in the upper 50's-60's at rest.   Unresponsiveness -Patient had an episode of urinary incontinence, limb shaking and staring spell with drooling and reported unresponsiveness prompting presentation. -May have been related to SVT with associated hypotension -No findings on echocardiogram to explain syncope. -Patient is also being evaluated by neurology for possible seizure activity, including EEG -Pt is alert and able to recall details about her cardiologist. Was unaware of where she was and why, but may be getting back to baseline.  Anemia -Hemoglobin was 6.5 on presentation, improved after 1 unit packed red blood cells to 9.8.  Up to 10.3 today. -Hemoccult stool negative. -Management per internal medicine.  Hypertension -Patient had recently been placed on amlodipine per PCP. -Blood pressure has been elevated up to 208/94. -Amlodipine is currently discontinued.  Now on metoprolol. -BP slightly improved but still elevated.  167/58 this morning. -Metoprolol has been titrated up to 50 mg twice daily.  Continue to monitor blood pressures. Unlikely to tolerate any further uptitration of BB. May need another agent.   CKD stage III -Serum creatinine rose slightly to 1.16, improved to 1.08 today. -Avoid nephrotoxic medications. -Continue to monitor     For questions or updates, please contact [place] Please consult [email or web address] for contact info under        Signed, Daune Perch, NP  01/24/2019, 11:16 AM    Patient seen and examined with Pecolia Ades NP.  Agree as above, with the  following exceptions and changes as noted below.Remains confused by pleasant, no dizziness or lightheadedness today. Gen: NAD, CV: RRR, no murmurs, Lungs: clear, Abd: soft, Extrem: Warm, well perfused, no edema, Neuro/Psych: pleasantly confused, oriented to person only All available labs, radiology testing, previous records reviewed. Decreased her beta blocker back to 25 mg bid metoprolol due to mild bradycardia. She will still need BP control, consider adding back low dose amlodipine as tolerated.   Elouise Munroe 01/24/19 4:29 PM

## 2019-01-25 LAB — URINALYSIS, ROUTINE W REFLEX MICROSCOPIC
Bilirubin Urine: NEGATIVE
Glucose, UA: NEGATIVE mg/dL
Hgb urine dipstick: NEGATIVE
Ketones, ur: NEGATIVE mg/dL
Nitrite: NEGATIVE
Protein, ur: NEGATIVE mg/dL
Specific Gravity, Urine: 1.015 (ref 1.005–1.030)
pH: 5 (ref 5.0–8.0)

## 2019-01-25 MED ORDER — AMLODIPINE BESYLATE 5 MG PO TABS
5.0000 mg | ORAL_TABLET | Freq: Every day | ORAL | Status: DC
  Administered 2019-01-25 – 2019-01-28 (×4): 5 mg via ORAL
  Filled 2019-01-25 (×4): qty 1

## 2019-01-25 MED ORDER — SODIUM CHLORIDE 0.9 % IV SOLN: INTRAVENOUS | Status: DC

## 2019-01-25 MED ORDER — CYANOCOBALAMIN 1000 MCG/ML IJ SOLN
1000.0000 ug | Freq: Once | INTRAMUSCULAR | Status: AC
  Administered 2019-01-25: 1000 ug via INTRAMUSCULAR
  Filled 2019-01-25: qty 1

## 2019-01-25 NOTE — Progress Notes (Signed)
Progress Note  Patient Name: Natalie Fitzgerald Date of Encounter: 01/25/2019  Primary Cardiologist: Quay Burow, MD   Subjective   Resting comfortably, appears disoriented. No further arrhythmia on monitor. Blood pressure was high all day yesterday, but finally within acceptable range this morning.   Inpatient Medications    Scheduled Meds: . amLODipine  5 mg Oral Daily  . cholecalciferol  5,000 Units Oral Q breakfast  . cyanocobalamin  1,000 mcg Intramuscular Once  . heparin  5,000 Units Subcutaneous Q8H  . metoprolol tartrate  25 mg Oral BID  . multivitamin with minerals  1 tablet Oral Q breakfast  . vitamin B-12  1,000 mcg Oral Q breakfast   Continuous Infusions: . sodium chloride 50 mL/hr at 01/25/19 0903   PRN Meds: acetaminophen **OR** acetaminophen, ondansetron **OR** ondansetron (ZOFRAN) IV   Vital Signs    Vitals:   01/24/19 1646 01/24/19 2110 01/25/19 0517 01/25/19 0925  BP: (!) 152/48 (!) 176/74 (!) 171/55 (!) 135/57  Pulse: 63 (!) 59 (!) 59 61  Resp: 16 12 20 18   Temp: 98 F (36.7 C) 98 F (36.7 C) 98.9 F (37.2 C) 97.6 F (36.4 C)  TempSrc: Oral Oral Oral Oral  SpO2: 98% 96% 98% 97%  Weight:  58.5 kg      Intake/Output Summary (Last 24 hours) at 01/25/2019 1012 Last data filed at 01/25/2019 0520 Gross per 24 hour  Intake 540 ml  Output 300 ml  Net 240 ml   Last 3 Weights 01/24/2019 01/23/2019 01/23/2019  Weight (lbs) 128 lb 15.5 oz 131 lb 6.3 oz 129 lb 3 oz  Weight (kg) 58.5 kg 59.6 kg 58.6 kg      Telemetry    Sinus rhythm, sinus bradycardia at night as low as 48 bpm, occasional PACs- Personally Reviewed  ECG    No new tracings- Personally Reviewed  Physical Exam  Appears elderly and frail GEN: No acute distress.   Neck: No JVD Cardiac: RRR, no murmurs, rubs, or gallops.  Respiratory: Clear to auscultation bilaterally. GI: Soft, nontender, non-distended  MS: No edema; No deformity. Neuro:  Nonfocal  Psych: Normal affect    Labs    High Sensitivity Troponin:   Recent Labs  Lab 01/23/19 0736  TROPONINIHS 186*      Chemistry Recent Labs  Lab 01/22/19 2053 01/23/19 1022 01/24/19 0354  NA 136 137 131*  K 3.4* 4.6 4.0  CL 113* 102 99  CO2 15* 23 20*  GLUCOSE 103* 102* 90  BUN 13 16 14   CREATININE 0.93 1.16* 1.08*  CALCIUM 7.1* 9.4 9.1  PROT  --  6.5  --   ALBUMIN  --  2.9*  --   AST  --  30  --   ALT  --  16  --   ALKPHOS  --  84  --   BILITOT  --  1.3*  --   GFRNONAA 54* 42* 45*  GFRAA >60 48* 53*  ANIONGAP 8 12 12      Hematology Recent Labs  Lab 01/22/19 2053 01/23/19 1022 01/24/19 0354  WBC 12.3* 11.9* 11.2*  RBC 2.54* 3.80*  3.90 3.97  HGB 6.5* 9.8* 10.3*  HCT 22.9* 31.0* 32.5*  MCV 90.2 81.6 81.9  MCH 25.6* 25.8* 25.9*  MCHC 28.4* 31.6 31.7  RDW 15.2 15.0 15.0  PLT 324 391 362    BNPNo results for input(s): BNP, PROBNP in the last 168 hours.   DDimer No results for input(s): DDIMER in the last  168 hours.   Radiology    EEG  Result Date: 01/23/2019 Lora Havens, MD     01/23/2019  1:00 PM Patient Name: JOICE DONZE MRN: [id number] Epilepsy Attending: Lora Havens Referring Physician/Provider: Dr. Gean Birchwood Date: 01/23/2019 Duration: 25.20 minutes Patient history: 86 year old female who presented with an episode of transient alteration of awareness.  EEG to evaluate for seizures. Level of alertness: Awake AEDs during EEG study: None Technical aspects: This EEG study was done with scalp electrodes positioned according to the 10-20 International system of electrode placement. Electrical activity was acquired at a sampling rate of 500Hz  and reviewed with a high frequency filter of 70Hz  and a low frequency filter of 1Hz . EEG data were recorded continuously and digitally stored. Description: EEG showed continuous generalized 3 to 6 Hz theta-delta slowing.  No clear posterior dominant rhythm was seen.  Hyperventilation and photic summation were not performed.  Abnormalities -Continuous slow, generalized IMPRESSION: This study is suggestive of mild to moderate diffuse encephalopathy, nonspecific etiology. No seizures or epileptiform discharges were seen throughout the recording.    Cardiac Studies   Echocardiogram 01/23/2019 1. Left ventricular ejection fraction, by visual estimation, is 65%. The left ventricle has normal function. There is mildly increased left ventricular hypertrophy. Angulation of the aorta without significant LVOT flow acceleration, no evidence of  outflow tract obstruction. 2. Left ventricular diastolic parameters are consistent with Grade I diastolic dysfunction (impaired relaxation). 3. Global right ventricle has normal systolic function.The right ventricular size is normal. No increase in right ventricular wall thickness. 4. Left atrial size was mild-moderately dilated. 5. The mitral valve is normal in structure. Mild mitral valve regurgitation. No evidence of mitral stenosis. 6. The tricuspid valve is normal in structure. Trivial TR. 7. The aortic valve is normal in structure. Aortic valve regurgitation is mild to moderate. No evidence of aortic valve sclerosis or stenosis. 8. The pulmonic valve was not well visualized. Pulmonic valve regurgitation is not visualized. 9. TR signal is inadequate for assessing pulmonary artery systolic pressure. 10. The inferior vena cava is normal in size with greater than 50% respiratory variability, suggesting right atrial pressure of 3 mmHg.  Echocardiogram 01/21/2014: Study Conclusions - Left ventricle: The cavity size was normal. Wall thickness was  normal. Systolic function was normal. The estimated ejection  fraction was in the range of 60% to 65%. Doppler parameters are  consistent with abnormal left ventricular relaxation (grade 1  diastolic dysfunction).  - Aortic valve: There was mild regurgitation.  - Atrial septum: No defect or patent foramen ovale was  identified  Patient Profile     86 y.o. female with a hx of borderline hypertension not on antihypertensives,mild aortic insufficiency and CKD stage III per echocardiogram 01/21/2014 with normal LVwho [facility] after an unresponsive episode, later found to be in SVT.  She was also anemic with hemoglobin of 6.5 and transfused.  Assessment & Plan    No further SVT.  Would not increase the beta-blockers further due to bradycardia at night. Pressure control is now acceptable.  I think systolic blood pressure less than 140 is tolerable in this 86 year old.  No additional changes to medications planned. [facility] will sign off.   Medication Recommendations: Continue metoprolol 50 mg twice daily and amlodipine 5 mg daily. Other recommendations (labs, testing, etc): Anemia work-up Follow up as an outpatient: We will arrange follow-up in 30 days, once she is out of rehab, with Dr. Gwenlyn Found.  For questions or updates,  please contact [place] Please consult [email or web address] for contact info under        Signed, Sanda Klein, MD  01/25/2019, 10:12 AM

## 2019-01-25 NOTE — Progress Notes (Signed)
Progress Note    Natalie Fitzgerald  [id number] DOB: 05/10/1927  DOA: 01/22/2019 PCP: [place], Betty G, MD    Brief Narrative:     Medical records reviewed and are as summarized below:  Natalie Fitzgerald is an 86 y.o. female with history of recently diagnosed hypertension anemia was brought to the ER after patient had an unresponsive episode and later was found to be in SVT.  Per daughter patient had a similar episode about 6 weeks ago and was taken to the ER at [facility] where patient was observed and discharged home.  At that time patient had sudden stiffening lasted for few minutes and resolved without any incontinence of urine or bowel or confusion.  Today patient was going to move to another seat when patient suddenly became unresponsive and was having generalized shaking mostly on one extremity.  Patient remained unconscious for at least 3 to 4 minutes.  EMS arrived and was found to be in SVT and per EMS report per the ER physician patient was easily arousable and answers questions.   Assessment/Plan:   Principal Problem:   Unresponsive episode Active Problems:   Hypertension   Aortic insufficiency   CKD (chronic kidney disease), stage III   DNR no code (do not resuscitate)   SVT (supraventricular tachycardia) (HCC)   Anemia   Unresponsive episode  -cause not clear--suspect orthostatic (have been ordered but not done vs SVT episode ) -EEG: study is suggestive of mild to moderate diffuse encephalopathy, nonspecific etiology. No seizures or epileptiform discharges were seen throughout the recording. -PT eval- SNF -urinalysis pending-- no urinary complaints  SVT  -responded to adenosine at the field.   -1 dose of IV metoprolol was given in the ER after a second episode happened -TSH ok -2D echo: Left ventricular ejection fraction, by visual estimation, is 65%.  -cardiology consult appreciated -BB started  Anemia -1 unit of PRBC transfusion give with increase from 6.5  to 9.8 -suspect  the initial number of 6.5 was a lab error  Hypertension - recently started on amlodipine by patient's primary care physician after the recent visit in the ER. -d/c this and start BB      Family Communication/Anticipated D/C date and plan/Code Status   DVT prophylaxis: heparin Code Status: dnr  Family Communication: Spoke with daughter Disposition Plan: Will need SNF placement   Medical Consultants:    cards     Subjective:   Tired this morning  Objective:    Vitals:   01/24/19 1646 01/24/19 2110 01/25/19 0517 01/25/19 0925  BP: (!) 152/48 (!) 176/74 (!) 171/55 (!) 135/57  Pulse: 63 (!) 59 (!) 59 61  Resp: 16 12 20 18   Temp: 98 F (36.7 C) 98 F (36.7 C) 98.9 F (37.2 C) 97.6 F (36.4 C)  TempSrc: Oral Oral Oral Oral  SpO2: 98% 96% 98% 97%  Weight:  58.5 kg      Intake/Output Summary (Last 24 hours) at 01/25/2019 1402 Last data filed at 01/25/2019 1221 Gross per 24 hour  Intake 600 ml  Output 300 ml  Net 300 ml   Filed Weights   01/23/19 0353 01/23/19 2146 01/24/19 2110  Weight: 58.6 kg 59.6 kg 58.5 kg    Exam: In bed, sleepy Mucous membranes dry Regular rate and rhythm No increased work of breathing Positive bowel sounds soft nontender    Data Reviewed:   I have personally reviewed following labs and imaging studies:  Labs: Labs show the following:  Basic Metabolic Panel: Recent Labs  Lab 01/22/19 2053 01/22/19 2053 01/23/19 1022 01/24/19 0354  NA 136  --  137 131*  K 3.4*   < > 4.6 4.0  CL 113*  --  102 99  CO2 15*  --  23 20*  GLUCOSE 103*  --  102* 90  BUN 13  --  16 14  CREATININE 0.93  --  1.16* 1.08*  CALCIUM 7.1*  --  9.4 9.1   < > = values in this interval not displayed.   GFR Estimated Creatinine Clearance: 30.5 mL/min (A) (by C-G formula based on SCr of 1.08 mg/dL (H)). Liver Function Tests: Recent Labs  Lab 01/23/19 1022  AST 30  ALT 16  ALKPHOS 84  BILITOT 1.3*  PROT 6.5  ALBUMIN 2.9*    No results for input(s): LIPASE, AMYLASE in the last 168 hours. No results for input(s): AMMONIA in the last 168 hours. Coagulation profile No results for input(s): INR, PROTIME in the last 168 hours.  CBC: Recent Labs  Lab 01/22/19 2053 01/23/19 1022 01/24/19 0354  WBC 12.3* 11.9* 11.2*  NEUTROABS 10.4* 8.9*  --   HGB 6.5* 9.8* 10.3*  HCT 22.9* 31.0* 32.5*  MCV 90.2 81.6 81.9  PLT 324 391 362   Cardiac Enzymes: No results for input(s): CKTOTAL, CKMB, CKMBINDEX, TROPONINI in the last 168 hours. BNP (last 3 results) No results for input(s): PROBNP in the last 8760 hours. CBG: No results for input(s): GLUCAP in the last 168 hours. D-Dimer: No results for input(s): DDIMER in the last 72 hours. Hgb A1c: No results for input(s): HGBA1C in the last 72 hours. Lipid Profile: No results for input(s): CHOL, HDL, LDLCALC, TRIG, CHOLHDL, LDLDIRECT in the last 72 hours. Thyroid function studies: Recent Labs    01/23/19 1022  TSH 3.958   Anemia work up: Recent Labs    01/23/19 1022  VITAMINB12 268  FOLATE 11.8  FERRITIN 26  TIBC 322  IRON 285*  RETICCTPCT 1.4   Sepsis Labs: Recent Labs  Lab 01/22/19 2053 01/23/19 1022 01/24/19 0354  WBC 12.3* 11.9* 11.2*    Microbiology Recent Results (from the past 240 hour(s))  SARS CORONAVIRUS 2 (TAT 6-24 HRS) Nasopharyngeal Nasopharyngeal Swab     Status: None   Collection Time: 01/22/19 10:09 PM   Specimen: Nasopharyngeal Swab  Result Value Ref Range Status   SARS Coronavirus 2 NEGATIVE NEGATIVE Final    Comment: (NOTE) SARS-CoV-2 target nucleic acids are NOT DETECTED. The SARS-CoV-2 RNA is generally detectable in upper and lower respiratory specimens during the acute phase of infection. Negative results do not preclude SARS-CoV-2 infection, do not rule out co-infections with other pathogens, and should not be used as the sole basis for treatment or other patient management decisions. Negative results must be combined  with clinical observations, patient history, and epidemiological information. The expected result is Negative. Fact Sheet for Patients: SugarRoll.be Fact Sheet for Healthcare Providers: https://www.woods-mathews.com/ This test is not yet approved or cleared by the [place] FDA and  has been authorized for detection and/or diagnosis of SARS-CoV-2 by FDA under an Emergency Use Authorization (EUA). This EUA will remain  in effect (meaning this test can be used) for the duration of the COVID-19 declaration under Section 56 4(b)(1) of the Act, 21 U.S.C. section 360bbb-3(b)(1), unless the authorization is terminated or revoked sooner. Performed at [facility], [place] [address] [place], [place] [address]     Procedures and diagnostic studies:  No  results found.  Medications:   . amLODipine  5 mg Oral Daily  . cholecalciferol  5,000 Units Oral Q breakfast  . heparin  5,000 Units Subcutaneous Q8H  . metoprolol tartrate  25 mg Oral BID  . multivitamin with minerals  1 tablet Oral Q breakfast  . vitamin B-12  1,000 mcg Oral Q breakfast   Continuous Infusions: . sodium chloride 50 mL/hr at 01/25/19 0903     LOS: 2 days   Geradine Girt  [facility]   How to contact the [facility] Attending or Consulting provider [place] or covering provider during after hours [place], for this patient?  1. Check the care team in [facility] and look for a) attending/consulting [facility] provider listed and b) the [facility] team listed 2. Log into [email or web address] and use [place]'s universal password to access. If you do not have the password, please contact the hospital operator. 3. Locate the [facility] provider you are looking for under [facility] and page to a number that you can be directly reached. 4. If you still have difficulty reaching the provider, please page the [facility] (Director on Call) for the Hospitalists listed on amion for assistance.  01/25/2019, 2:02  PM

## 2019-01-25 NOTE — NC FL2 (Signed)
[place] LEVEL OF CARE SCREENING TOOL     IDENTIFICATION  Patient Name: Natalie Fitzgerald Birthdate: June 29, 1927 Sex: female Admission Date (Current Location): 01/22/2019  [facility] and [place] Number:  Herbalist and Address:  [facility], [place] [address], [place], [place] [address]      Provider Number: [id number]  Attending Physician Name and Address:  Geradine Girt, DO  Relative Name and Phone Number:       Current Level of Care: Hospital Recommended Level of Care: [place] Prior Approval Number:    Date Approved/Denied:   PASRR Number: [id number]  Discharge Plan: SNF    Current Diagnoses: Patient Active Problem List   Diagnosis Date Noted  . SVT (supraventricular tachycardia) ([place]) 01/22/2019  . Unresponsive episode 01/22/2019  . Anemia 01/22/2019  . DNR no code (do not resuscitate) 12/24/2018  . Unstable gait 06/25/2017  . CKD (chronic kidney disease), stage III 06/25/2017  . Aortic insufficiency 01/14/2014  . Hypertension 09/13/2010  . Left breast mass, uoq 07/19/2010  . SEBORRHEIC KERATOSIS 11/27/2008  . LOW BACK PAIN 01/24/2007  . OSTEOPENIA 01/24/2007  . Hyperlipemia 01/09/2007  . Allergic rhinitis 01/09/2007    Orientation RESPIRATION BLADDER Height & Weight     Self, Place  Normal External catheter, Incontinent(placed 01/23/19) Weight: 128 lb 15.5 oz (58.5 kg) Height:     BEHAVIORAL SYMPTOMS/MOOD NEUROLOGICAL BOWEL NUTRITION STATUS      Continent Diet(heart healthy, thin liquids)  AMBULATORY STATUS COMMUNICATION OF NEEDS Skin   Extensive Assist   Normal                       Personal Care Assistance Level of Assistance  Bathing, Feeding, Dressing Bathing Assistance: Maximum assistance Feeding assistance: Independent Dressing Assistance: Maximum assistance     Functional Limitations Info  Sight, Hearing, Speech Sight Info: Adequate Hearing Info: Adequate Speech Info:  Adequate    SPECIAL CARE FACTORS FREQUENCY  PT (By licensed PT), OT (By licensed OT)     PT Frequency: 5x OT Frequency: 5x            Contractures Contractures Info: Not present    Additional Factors Info  Code Status, Allergies Code Status Info: DNR Allergies Info: No known allergies           Current Medications (01/25/2019):  This is the current hospital active medication list Current Facility-Administered Medications  Medication Dose Route Frequency Provider Last Rate Last Admin  . 0.9 %  sodium chloride infusion   Intravenous Continuous Geradine Girt, DO 50 mL/hr at 01/25/19 0903 New Bag at 01/25/19 0903  . acetaminophen (TYLENOL) tablet 650 mg  650 mg Oral Q6H PRN Rise Patience, MD   650 mg at 01/25/19 0602   Or  . acetaminophen (TYLENOL) suppository 650 mg  650 mg Rectal Q6H PRN Rise Patience, MD      . amLODipine (NORVASC) tablet 5 mg  5 mg Oral Daily Eulogio Bear U, DO   5 mg at 01/25/19 0904  . cholecalciferol (VITAMIN D3) tablet 5,000 Units  5,000 Units Oral Q breakfast Eulogio Bear U, DO   5,000 Units at 01/25/19 0904  . heparin injection 5,000 Units  5,000 Units Subcutaneous Q8H Rise Patience, MD   5,000 Units at 01/25/19 0558  . metoprolol tartrate (LOPRESSOR) tablet 25 mg  25 mg Oral BID Cherlynn Kaiser A, MD   25 mg at 01/25/19 0904  .  multivitamin with minerals tablet 1 tablet  1 tablet Oral Q breakfast Eulogio Bear U, DO   1 tablet at 01/25/19 [id number]  . ondansetron (ZOFRAN) tablet 4 mg  4 mg Oral Q6H PRN Rise Patience, MD       Or  . ondansetron [facility]) injection 4 mg  4 mg Intravenous Q6H PRN Rise Patience, MD      . vitamin B-12 (CYANOCOBALAMIN) tablet 1,000 mcg  1,000 mcg Oral Q breakfast Eulogio Bear U, DO   1,000 mcg at 01/25/19 [id number]     Discharge Medications: Please see discharge summary for a list of discharge medications.  Relevant Imaging Results:  Relevant Lab Results:   Additional  Information [id number]  Gerrianne Scale Lenin Kuhnle, LCSW

## 2019-01-26 LAB — CBC
HCT: 29 % — ABNORMAL LOW (ref 36.0–46.0)
Hemoglobin: 9.3 g/dL — ABNORMAL LOW (ref 12.0–15.0)
MCH: 26.3 pg (ref 26.0–34.0)
MCHC: 32.1 g/dL (ref 30.0–36.0)
MCV: 82.2 fL (ref 80.0–100.0)
Platelets: 332 10*3/uL (ref 150–400)
RBC: 3.53 MIL/uL — ABNORMAL LOW (ref 3.87–5.11)
RDW: 15.1 % (ref 11.5–15.5)
WBC: 11.3 10*3/uL — ABNORMAL HIGH (ref 4.0–10.5)
nRBC: 0 % (ref 0.0–0.2)

## 2019-01-26 LAB — BASIC METABOLIC PANEL
Anion gap: 11 (ref 5–15)
BUN: 22 mg/dL (ref 8–23)
CO2: 22 mmol/L (ref 22–32)
Calcium: 8.7 mg/dL — ABNORMAL LOW (ref 8.9–10.3)
Chloride: 103 mmol/L (ref 98–111)
Creatinine, Ser: 1.24 mg/dL — ABNORMAL HIGH (ref 0.44–1.00)
GFR calc Af Amer: 45 mL/min — ABNORMAL LOW (ref >60)
GFR calc non Af Amer: 38 mL/min — ABNORMAL LOW (ref >60)
Glucose, Bld: 93 mg/dL (ref 70–99)
Potassium: 3.9 mmol/L (ref 3.5–5.1)
Sodium: 136 mmol/L (ref 135–145)

## 2019-01-26 NOTE — Plan of Care (Signed)
Pt. Is requiring extensive assist with ADLs. Pt. Is Mod A with sit to stand and was unable to have pt. Take steps. Per Pt. She has "a lot" of help at home. Unsure of how much assist she was having from family. Unsure what pt. Baseline is. Pt. May be able to d/c home if family can assist at this level or DC to SNF for rehab.

## 2019-01-26 NOTE — Evaluation (Signed)
Occupational Therapy Evaluation Patient Details Name: Natalie Fitzgerald MRN: [id number] DOB: July 20, 1927 Today's Date: 01/26/2019    History of Present Illness 86 yo female with onset of unresponsive episode at home with daughter was found to be in SVT at ED.  Had encephalopathy on EEG, transfused and no seizure activity noted.  PMHx:  aortic valve disorder, glaucoma, HTN, CKD   Clinical Impression   Pt. Is requiring extensive assist with ADLs. Pt. Is Mod A with sit to stand and was unable to have pt. Take steps. Per Pt. She has "a lot" of help at home. Unsure of how much assist she was having from family. Unsure what pt. Baseline is. Pt. May be able to d/c home if family can assist at this level or DC to SNF for rehab.    Follow Up Recommendations  Home health OT;SNF;Supervision/Assistance - 24 hour    Equipment Recommendations  None recommended by OT    Recommendations for Other Services       Precautions / Restrictions Precautions Precautions: Fall Precaution Comments: monitor vitals Restrictions Weight Bearing Restrictions: No Other Position/Activity Restrictions: confusion      Mobility Bed Mobility       Sidelying to sit: Mod assist     Sit to sidelying: Min guard    Transfers Overall transfer level: Needs assistance   Transfers: Sit to/from Stand Sit to Stand: Mod assist         General transfer comment: patient would not attempt to take steps.     Balance     Sitting balance-Leahy Scale: Fair                                     ADL either performed or assessed with clinical judgement   ADL Overall ADL's : Needs assistance/impaired Eating/Feeding: Set up   Grooming: Wash/dry hands;Wash/dry face;Minimal assistance;Brushing hair   Upper Body Bathing: Minimal assistance;Sitting   Lower Body Bathing: Total assistance;Sit to/from stand   Upper Body Dressing : Moderate assistance;Sitting   Lower Body Dressing: Total assistance;Sit  to/from stand               Functional mobility during ADLs: Moderate assistance(Mod A with sit to stand. would not take side steps. ) General ADL Comments: patient was able to sit eob S level for grooming task.     Vision Baseline Vision/History: Wears glasses Wears Glasses: Reading only Patient Visual Report: No change from baseline       Perception     Praxis      Pertinent Vitals/Pain Pain Assessment: No/denies pain     Hand Dominance Right   Extremity/Trunk Assessment Upper Extremity Assessment Upper Extremity Assessment: Generalized weakness           Communication Communication Communication: HOH   Cognition Arousal/Alertness: Lethargic Behavior During Therapy: Flat affect Overall Cognitive Status: No family/caregiver present to determine baseline cognitive functioning                                     General Comments       Exercises     Shoulder Instructions      Home Living Family/patient expects to be discharged to:: Private residence Living Arrangements: Children Available Help at Discharge: Family;Available 24 hours/day Type of Home: House Home Access: Stairs to enter [employer] of Steps: 7  Bathroom Shower/Tub: Tub/shower unit;Walk-in shower   Bathroom Toilet: Standard     Home Equipment: Environmental consultant - 2 wheels;Wheelchair - manual;Shower seat;Bedside commode          Prior Functioning/Environment Level of Independence: Needs assistance  Gait / Transfers Assistance Needed: patient stated she did not walk much ADL's / Homemaking Assistance Needed: patietn stated she needs lots of help            OT Problem List: Decreased strength;Decreased activity tolerance;Impaired balance (sitting and/or standing);Decreased knowledge of use of DME or AE      OT Treatment/Interventions: Self-care/ADL training;DME and/or AE instruction;Therapeutic activities;Patient/family education    OT Goals(Current  goals can be found in the care plan section) Acute Rehab OT Goals Patient Stated Goal: to go home OT Goal Formulation: With patient Time For Goal Achievement: 02/09/19 Potential to Achieve Goals: Good  OT Frequency: Min 2X/week   Barriers to D/C: (unknonw level of support)          Co-evaluation              AM-PAC OT "6 Clicks" Daily Activity     Outcome Measure Help from another person eating meals?: A Little Help from another person taking care of personal grooming?: A Little Help from another person toileting, which includes using toliet, bedpan, or urinal?: Total Help from another person bathing (including washing, rinsing, drying)?: Total Help from another person to put on and taking off regular upper body clothing?: A Lot Help from another person to put on and taking off regular lower body clothing?: Total 6 Click Score: 11   End of Session Nurse Communication: (ok therapy)  Activity Tolerance: Patient limited by fatigue Patient left: in bed;with call bell/phone within reach;with bed alarm set  OT Visit Diagnosis: Unsteadiness on feet (R26.81);Muscle weakness (generalized) (M62.81)                Time: [id number] OT Time Calculation (min): 33 min Charges:  OT General Charges $OT Visit: 1 Visit OT Evaluation $OT Eval Low Complexity: 1 Low OT Treatments $Self Care/Home Management : [id number] mins   6 clicks Jorrell Kuster 01/26/2019, 9:24 AM

## 2019-01-26 NOTE — Progress Notes (Signed)
Progress Note    Natalie Fitzgerald  [id number] DOB: 03-Oct-1927  DOA: 01/22/2019 PCP: [place], Betty G, MD    Brief Narrative:     Medical records reviewed and are as summarized below:  Natalie Fitzgerald is an 86 y.o. female with history of recently diagnosed hypertension anemia was brought to the ER after patient had an unresponsive episode and later was found to be in SVT.  Per daughter patient had a similar episode about 6 weeks ago and was taken to the ER at [facility] where patient was observed and discharged home.  At that time patient had sudden stiffening lasted for few minutes and resolved without any incontinence of urine or bowel or confusion.  Today patient was going to move to another seat when patient suddenly became unresponsive and was having generalized shaking mostly on one extremity.  Patient remained unconscious for at least 3 to 4 minutes.  EMS arrived and was found to be in SVT and per EMS report per the ER physician patient was easily arousable and answers questions.   Assessment/Plan:   Principal Problem:   Unresponsive episode Active Problems:   Hypertension   Aortic insufficiency   CKD (chronic kidney disease), stage III   DNR no code (do not resuscitate)   SVT (supraventricular tachycardia) (HCC)   Anemia   Unresponsive episode  -cause not clear--suspect orthostatic (have been ordered but not done vs SVT episode ) -EEG: study is suggestive of mild to moderate diffuse encephalopathy, nonspecific etiology. No seizures or epileptiform discharges were seen throughout the recording. -PT eval- SNF-- daughter not able to provide 24 hour care at home  SVT  -responded to adenosine at the field.   -1 dose of IV metoprolol was given in the ER after a second episode happened -TSH ok -2D echo: Left ventricular ejection fraction, by visual estimation, is 65%.  -cardiology consult appreciated -BB started  Anemia -1 unit of PRBC transfusion give with increase  from 6.5 to 9.8 -suspect  the initial number of 6.5 was a lab error  Hypertension - recently started on amlodipine by patient's primary care physician after the recent visit in the ER. -d/c this and start BB      Family Communication/Anticipated D/C date and plan/Code Status   DVT prophylaxis: heparin Code Status: dnr  Family Communication: Spoke with daughter 1/16 Disposition Plan: SNF when bed available   Medical Consultants:    cards     Subjective:   Tired this morning  Objective:    Vitals:   01/25/19 1722 01/25/19 2044 01/26/19 0509 01/26/19 0931  BP: (!) 158/58 140/62 (!) 148/68 (!) 160/64  Pulse: 63 77 60 63  Resp: 18 18 18 18   Temp:  98.4 F (36.9 C) 98 F (36.7 C) 99.2 F (37.3 C)  TempSrc:  Oral Oral Oral  SpO2: 97% 99% 98% 98%  Weight:        Intake/Output Summary (Last 24 hours) at 01/26/2019 1355 Last data filed at 01/26/2019 1053 Gross per 24 hour  Intake 480 ml  Output 1000 ml  Net -520 ml   Filed Weights   01/23/19 0353 01/23/19 2146 01/24/19 2110  Weight: 58.6 kg 59.6 kg 58.5 kg    Exam: In bed, NAD Pleasant and cooperative but falls asleep quickly rrr No increased work of breathing +BS, soft, NT    Data Reviewed:   I have personally reviewed following labs and imaging studies:  Labs: Labs show the following:  Basic Metabolic Panel: Recent Labs  Lab 01/22/19 2053 01/22/19 2053 01/23/19 1022 01/23/19 1022 01/24/19 0354 01/26/19 0419  NA 136  --  137  --  131* 136  K 3.4*   < > 4.6   < > 4.0 3.9  CL 113*  --  102  --  99 103  CO2 15*  --  23  --  20* 22  GLUCOSE 103*  --  102*  --  90 93  BUN 13  --  16  --  14 22  CREATININE 0.93  --  1.16*  --  1.08* 1.24*  CALCIUM 7.1*  --  9.4  --  9.1 8.7*   < > = values in this interval not displayed.   GFR Estimated Creatinine Clearance: 26.6 mL/min (A) (by C-G formula based on SCr of 1.24 mg/dL (H)). Liver Function Tests: Recent Labs  Lab 01/23/19 1022  AST 30   ALT 16  ALKPHOS 84  BILITOT 1.3*  PROT 6.5  ALBUMIN 2.9*   No results for input(s): LIPASE, AMYLASE in the last 168 hours. No results for input(s): AMMONIA in the last 168 hours. Coagulation profile No results for input(s): INR, PROTIME in the last 168 hours.  CBC: Recent Labs  Lab 01/22/19 2053 01/23/19 1022 01/24/19 0354 01/26/19 0419  WBC 12.3* 11.9* 11.2* 11.3*  NEUTROABS 10.4* 8.9*  --   --   HGB 6.5* 9.8* 10.3* 9.3*  HCT 22.9* 31.0* 32.5* 29.0*  MCV 90.2 81.6 81.9 82.2  PLT 324 391 362 332   Cardiac Enzymes: No results for input(s): CKTOTAL, CKMB, CKMBINDEX, TROPONINI in the last 168 hours. BNP (last 3 results) No results for input(s): PROBNP in the last 8760 hours. CBG: No results for input(s): GLUCAP in the last 168 hours. D-Dimer: No results for input(s): DDIMER in the last 72 hours. Hgb A1c: No results for input(s): HGBA1C in the last 72 hours. Lipid Profile: No results for input(s): CHOL, HDL, LDLCALC, TRIG, CHOLHDL, LDLDIRECT in the last 72 hours. Thyroid function studies: No results for input(s): TSH, T4TOTAL, T3FREE, THYROIDAB in the last 72 hours.  Invalid input(s): FREET3 Anemia work up: No results for input(s): VITAMINB12, FOLATE, FERRITIN, TIBC, IRON, RETICCTPCT in the last 72 hours. Sepsis Labs: Recent Labs  Lab 01/22/19 2053 01/23/19 1022 01/24/19 0354 01/26/19 0419  WBC 12.3* 11.9* 11.2* 11.3*    Microbiology Recent Results (from the past 240 hour(s))  SARS CORONAVIRUS 2 (TAT 6-24 HRS) Nasopharyngeal Nasopharyngeal Swab     Status: None   Collection Time: 01/22/19 10:09 PM   Specimen: Nasopharyngeal Swab  Result Value Ref Range Status   SARS Coronavirus 2 NEGATIVE NEGATIVE Final    Comment: (NOTE) SARS-CoV-2 target nucleic acids are NOT DETECTED. The SARS-CoV-2 RNA is generally detectable in upper and lower respiratory specimens during the acute phase of infection. Negative results do not preclude SARS-CoV-2 infection, do not rule  out co-infections with other pathogens, and should not be used as the sole basis for treatment or other patient management decisions. Negative results must be combined with clinical observations, patient history, and epidemiological information. The expected result is Negative. Fact Sheet for Patients: SugarRoll.be Fact Sheet for Healthcare Providers: https://www.woods-mathews.com/ This test is not yet approved or cleared by the [place] FDA and  has been authorized for detection and/or diagnosis of SARS-CoV-2 by FDA under an Emergency Use Authorization (EUA). This EUA will remain  in effect (meaning this test can be used) for the duration of the COVID-19  declaration under Section 56 4(b)(1) of the Act, 21 U.S.C. section 360bbb-3(b)(1), unless the authorization is terminated or revoked sooner. Performed at [facility], [place] [address], [place], [place] [address]     Procedures and diagnostic studies:  No results found.  Medications:   . amLODipine  5 mg Oral Daily  . cholecalciferol  5,000 Units Oral Q breakfast  . heparin  5,000 Units Subcutaneous Q8H  . metoprolol tartrate  25 mg Oral BID  . multivitamin with minerals  1 tablet Oral Q breakfast  . vitamin B-12  1,000 mcg Oral Q breakfast   Continuous Infusions: . sodium chloride 50 mL/hr at 01/26/19 0516     LOS: 3 days   Geradine Girt  [facility]   How to contact the [facility] Attending or Consulting provider [place] or covering provider during after hours [place], for this patient?  1. Check the care team in [facility] and look for a) attending/consulting TR[facility] provider listed and b) the [facility] team listed 2. Log into [email or web address] and use [place]'s universal password to access. If you do not have the password, please contact the hospital operator. 3. Locate the [facility] provider you are looking for under [facility] and page to a number that you can be directly  reached. 4. If you still have difficulty reaching the provider, please page the [facility] (Director on Call) for the Hospitalists listed on amion for assistance.  01/26/2019, 1:55 PM

## 2019-01-27 LAB — BASIC METABOLIC PANEL
Anion gap: 10 (ref 5–15)
BUN: 17 mg/dL (ref 8–23)
CO2: 21 mmol/L — ABNORMAL LOW (ref 22–32)
Calcium: 8.8 mg/dL — ABNORMAL LOW (ref 8.9–10.3)
Chloride: 106 mmol/L (ref 98–111)
Creatinine, Ser: 1.02 mg/dL — ABNORMAL HIGH (ref 0.44–1.00)
GFR calc Af Amer: 56 mL/min — ABNORMAL LOW (ref >60)
GFR calc non Af Amer: 49 mL/min — ABNORMAL LOW (ref >60)
Glucose, Bld: 93 mg/dL (ref 70–99)
Potassium: 3.9 mmol/L (ref 3.5–5.1)
Sodium: 137 mmol/L (ref 135–145)

## 2019-01-27 LAB — CBC
HCT: 27.2 % — ABNORMAL LOW (ref 36.0–46.0)
Hemoglobin: 8.4 g/dL — ABNORMAL LOW (ref 12.0–15.0)
MCH: 25.4 pg — ABNORMAL LOW (ref 26.0–34.0)
MCHC: 30.9 g/dL (ref 30.0–36.0)
MCV: 82.2 fL (ref 80.0–100.0)
Platelets: 315 10*3/uL (ref 150–400)
RBC: 3.31 MIL/uL — ABNORMAL LOW (ref 3.87–5.11)
RDW: 15.3 % (ref 11.5–15.5)
WBC: 11 10*3/uL — ABNORMAL HIGH (ref 4.0–10.5)
nRBC: 0 % (ref 0.0–0.2)

## 2019-01-27 NOTE — TOC Progression Note (Signed)
Transition of Care [facility]) - Progression Note    Patient Details  Name: Natalie Fitzgerald MRN: [id number] Date of Birth: October 07, 1927  Transition of Care [facility]) CM/SW Contact  Sharlet Salina Mila Homer, LCSW Phone Number: 01/27/2019, 6:33 PM  Clinical Narrative: CSW visited patient's room and spoke with daughter [name] regarding a facility for patient. On 1/15 Ms. Stann Mainland had provided CSW with 2 SNF choices: [facility] and [facility]. Daughter informed that neither one of those facilities could accept patient (calls made to SNF's on 1/18) . CSW provided with 2 additional choices: [facility] and [facility]. Contact made with [facility] and no bed availability at this time. Contact made with [facility] and per [facility], admissions director, they can take patient. Wife updated and was agreeable to going with [facility]. [name] contacted and will initiate insurance auth today as CSW contacted [employer] for [facility] and was informed that patient was termed in their system since May 09, 2018.       Expected Discharge Plan: Skilled Nursing Facility Barriers to Discharge: Continued Medical Work up  Expected Discharge Plan and Services Expected Discharge Plan: [place] In-house Referral: Clinical Social Work Discharge Planning Services: Other - See comment(SNF Placement)   Living arrangements for the past 2 months: Single Family Home(Patient lives with daughter [name])                                       Social Determinants of Health (SDOH) Interventions    Readmission Risk Interventions No flowsheet data found.

## 2019-01-27 NOTE — Progress Notes (Signed)
[facility] PROGRESS NOTE  Natalie Fitzgerald [id number] DOB: 10-22-1927 DOA: 01/22/2019 PCP: [place], Betty G, MD  Assessment/Plan: Andre Lefort episode. Cause not clear--suspect orthostatic. Awaiting orthostatics. EEG: study issuggestive of mild to moderate diffuse encephalopathy, nonspecific etiology. No seizures or epileptiform discharges were seen throughout the recording. No metabolic derangement. No signs of infectous process.  -PT eval- SNF-- daughter not able to provide 24 hour care at home -awaiting insurance approval for placement -mobilize  SVTResponded to adenosine at the field. 1 dose of IV metoprolol was given in the ER after a second episode happened.TSH ok. 2D echo: Left ventricular ejection fraction, by visual estimation, is 65%. Evaluated by cardiology who recommended not increasing BB due to hx brady at night. Follow up 30 days. No further episodes -cardiology consult appreciated -BB started  Anemia. 1 unit of PRBC transfusion give with increase from 6.5 to 9.8. Suspect  the initial number of 6.5 was a lab error. Today Hg 8.4. no s/sx bleeding.   Hypertension. Recently started on amlodipine by patient's primary care physician after the recent visit in the ER. -d/c this and started BB    Code Status: dnr Family Communication: patient Disposition Plan: snf   Consultants:  croitoru cardiology  Procedures:    Antibiotics:    HPI/Subjective: Awake alert no acute distress. Denies/pain or discomfort  Objective: Vitals:   01/27/19 0439 01/27/19 0855  BP: (!) 148/68 (!) 142/66  Pulse:    Resp: 18 18  Temp: 98 F (36.7 C) 98.2 F (36.8 C)  SpO2: 98% 100%    Intake/Output Summary (Last 24 hours) at 01/27/2019 1400 Last data filed at 01/27/2019 0947 Gross per 24 hour  Intake 240 ml  Output 600 ml  Net -360 ml   Filed Weights   01/23/19 0353 01/23/19 2146 01/24/19 2110  Weight: 58.6 kg 59.6 kg 58.5 kg    Exam:   General:  Thin  frail no acute distress  Cardiovascular: rrr no mgr no LE edema  Respiratory: normal effort BS clear bilaterally no wheeze  Abdomen: non-distended soft +BS no guarding or rebounding  Musculoskeletal: joints without swelling/erythema   Data Reviewed: Basic Metabolic Panel: Recent Labs  Lab 01/22/19 2053 01/23/19 1022 01/24/19 0354 01/26/19 0419 01/27/19 0413  NA 136 137 131* 136 137  K 3.4* 4.6 4.0 3.9 3.9  CL 113* 102 99 103 106  CO2 15* 23 20* 22 21*  GLUCOSE 103* 102* 90 93 93  BUN 13 16 14 22 17   CREATININE 0.93 1.16* 1.08* 1.24* 1.02*  CALCIUM 7.1* 9.4 9.1 8.7* 8.8*   Liver Function Tests: Recent Labs  Lab 01/23/19 1022  AST 30  ALT 16  ALKPHOS 84  BILITOT 1.3*  PROT 6.5  ALBUMIN 2.9*   No results for input(s): LIPASE, AMYLASE in the last 168 hours. No results for input(s): AMMONIA in the last 168 hours. CBC: Recent Labs  Lab 01/22/19 2053 01/23/19 1022 01/24/19 0354 01/26/19 0419 01/27/19 0413  WBC 12.3* 11.9* 11.2* 11.3* 11.0*  NEUTROABS 10.4* 8.9*  --   --   --   HGB 6.5* 9.8* 10.3* 9.3* 8.4*  HCT 22.9* 31.0* 32.5* 29.0* 27.2*  MCV 90.2 81.6 81.9 82.2 82.2  PLT 324 391 362 332 315   Cardiac Enzymes: No results for input(s): CKTOTAL, CKMB, CKMBINDEX, TROPONINI in the last 168 hours. BNP (last 3 results) No results for input(s): BNP in the last 8760 hours.  ProBNP (last 3 results) No results for input(s): PROBNP in the last 8760  hours.  CBG: No results for input(s): GLUCAP in the last 168 hours.  Recent Results (from the past 240 hour(s))  SARS CORONAVIRUS 2 (TAT 6-24 HRS) Nasopharyngeal Nasopharyngeal Swab     Status: None   Collection Time: 01/22/19 10:09 PM   Specimen: Nasopharyngeal Swab  Result Value Ref Range Status   SARS Coronavirus 2 NEGATIVE NEGATIVE Final    Comment: (NOTE) SARS-CoV-2 target nucleic acids are NOT DETECTED. The SARS-CoV-2 RNA is generally detectable in upper and lower respiratory specimens during the acute  phase of infection. Negative results do not preclude SARS-CoV-2 infection, do not rule out co-infections with other pathogens, and should not be used as the sole basis for treatment or other patient management decisions. Negative results must be combined with clinical observations, patient history, and epidemiological information. The expected result is Negative. Fact Sheet for Patients: SugarRoll.be Fact Sheet for Healthcare Providers: https://www.woods-mathews.com/ This test is not yet approved or cleared by the [place] FDA and  has been authorized for detection and/or diagnosis of SARS-CoV-2 by FDA under an Emergency Use Authorization (EUA). This EUA will remain  in effect (meaning this test can be used) for the duration of the COVID-19 declaration under Section 56 4(b)(1) of the Act, 21 U.S.C. section 360bbb-3(b)(1), unless the authorization is terminated or revoked sooner. Performed at [facility], [place] [address] [place], [place] [address]      Studies: No results found.  Scheduled Meds: . amLODipine  5 mg Oral Daily  . cholecalciferol  5,000 Units Oral Q breakfast  . heparin  5,000 Units Subcutaneous Q8H  . metoprolol tartrate  25 mg Oral BID  . multivitamin with minerals  1 tablet Oral Q breakfast  . vitamin B-12  1,000 mcg Oral Q breakfast   Continuous Infusions:  Principal Problem:   Unresponsive episode Active Problems:   Hypertension   CKD (chronic kidney disease), stage III   SVT (supraventricular tachycardia) (HCC)   Anemia   Aortic insufficiency   DNR no code (do not resuscitate)    Time spent: 42 minutes    Bella Villa NP  [facility]  If 7PM-7AM, please contact night-coverage at [email or web address], password [facility] 01/27/2019, 2:00 PM  LOS: 4 days

## 2019-01-28 DIAGNOSIS — G9349 Other encephalopathy: Secondary | ICD-10-CM | POA: Diagnosis not present

## 2019-01-28 DIAGNOSIS — I471 Supraventricular tachycardia: Secondary | ICD-10-CM | POA: Diagnosis not present

## 2019-01-28 DIAGNOSIS — E7849 Other hyperlipidemia: Secondary | ICD-10-CM | POA: Diagnosis not present

## 2019-01-28 DIAGNOSIS — R55 Syncope and collapse: Secondary | ICD-10-CM | POA: Diagnosis not present

## 2019-01-28 DIAGNOSIS — M6281 Muscle weakness (generalized): Secondary | ICD-10-CM | POA: Diagnosis not present

## 2019-01-28 DIAGNOSIS — Z7401 Bed confinement status: Secondary | ICD-10-CM | POA: Diagnosis not present

## 2019-01-28 DIAGNOSIS — Z66 Do not resuscitate: Secondary | ICD-10-CM | POA: Diagnosis not present

## 2019-01-28 DIAGNOSIS — R41841 Cognitive communication deficit: Secondary | ICD-10-CM | POA: Diagnosis not present

## 2019-01-28 DIAGNOSIS — G4089 Other seizures: Secondary | ICD-10-CM | POA: Diagnosis not present

## 2019-01-28 DIAGNOSIS — E538 Deficiency of other specified B group vitamins: Secondary | ICD-10-CM | POA: Diagnosis not present

## 2019-01-28 DIAGNOSIS — U071 COVID-19: Secondary | ICD-10-CM | POA: Diagnosis not present

## 2019-01-28 DIAGNOSIS — M2550 Pain in unspecified joint: Secondary | ICD-10-CM | POA: Diagnosis not present

## 2019-01-28 DIAGNOSIS — D6489 Other specified anemias: Secondary | ICD-10-CM | POA: Diagnosis not present

## 2019-01-28 DIAGNOSIS — R2681 Unsteadiness on feet: Secondary | ICD-10-CM | POA: Diagnosis not present

## 2019-01-28 DIAGNOSIS — R2689 Other abnormalities of gait and mobility: Secondary | ICD-10-CM | POA: Diagnosis not present

## 2019-01-28 DIAGNOSIS — I10 Essential (primary) hypertension: Secondary | ICD-10-CM | POA: Diagnosis not present

## 2019-01-28 LAB — SARS CORONAVIRUS 2 (TAT 6-24 HRS): SARS Coronavirus 2: NEGATIVE

## 2019-01-28 MED ORDER — HYDRALAZINE HCL 10 MG PO TABS
10.0000 mg | ORAL_TABLET | Freq: Three times a day (TID) | ORAL | Status: DC
  Administered 2019-01-28: 10 mg via ORAL
  Filled 2019-01-28: qty 1

## 2019-01-28 MED ORDER — AMLODIPINE BESYLATE 5 MG PO TABS: 5.0000 mg | ORAL_TABLET | Freq: Every day | ORAL | Status: DC

## 2019-01-28 MED ORDER — METOPROLOL TARTRATE 25 MG PO TABS: 25.0000 mg | ORAL_TABLET | Freq: Two times a day (BID) | ORAL | Status: DC

## 2019-01-28 MED ORDER — HYDRALAZINE HCL 10 MG PO TABS: 10.0000 mg | ORAL_TABLET | Freq: Three times a day (TID) | ORAL | Status: DC

## 2019-01-28 MED ORDER — CYANOCOBALAMIN 1000 MCG PO TABS: 1000.0000 ug | ORAL_TABLET | Freq: Every day | ORAL | Status: DC

## 2019-01-28 MED ORDER — HYDRALAZINE HCL 25 MG PO TABS
25.0000 mg | ORAL_TABLET | Freq: Once | ORAL | Status: AC
  Administered 2019-01-28: 25 mg via ORAL
  Filled 2019-01-28: qty 1

## 2019-01-28 NOTE — Progress Notes (Signed)
Report given to nurse Dorthula Perfect) at [facility], answered all her questions, removed IV and tele box.  Pt waiting for PTAR for pickup.  Oncoming nurse is aware of the situation.

## 2019-01-28 NOTE — Progress Notes (Signed)
[facility] PROGRESS NOTE  Natalie Fitzgerald [id number] DOB: 25-Jun-1927 DOA: 01/22/2019 PCP: [place], Betty G, MD  Assessment/Plan:  1.  Unresponsive episode.  Likely related to SVT.  EEG study issuggestive of mild to moderate diffuse encephalopathy, nonspecific etiology. No seizures or epileptiform discharges were seen throughout the recording. No metabolic derangement. No signs of infectous process.   No further episodes during hospitalization.  Daughter not able to provide 24-hour care. -Await insurance authorization  #2.  SVT.  Responded to adenosine in the field.  So provided with 1 dose of IV metoprolol in the emergency department.  TSH within the limits of normal.  2D echo with an EF of 65%.  Evaluated by cardiology recommended not increasing beta-blocker due to history of bradycardia at night.  Follow-up with cards in 30 days -Continue beta-blocker  #3.  Anemia.  Hemoglobin 6.5 on presentation but suspect this is error as provided with 1 unit of packed cells and hemoglobin increased to 9.8.  Hemoglobin 8.4 this morning. -Monitor  #4.  Hypertension.  Recently started on amlodipine by her primary care physician.  Amlodipine. beta-blocker was started.  Blood pressure remains elevated. -continue BB and amlodipine -add appresaline.  -monitor  Code Status: dnr Family Communication: patient at bedside Disposition Plan: snf once ins auth obtained   Consultants:  croitoru cardiology  Procedures:    Antibiotics:    HPI/Subjective: Lying in bed with eyes closed.  Arouses to verbal stimuli.  Denies pain discomfort.  Objective: Vitals:   01/28/19 0522 01/28/19 0911  BP: (!) 160/61 (!) 160/59  Pulse: (!) 58 64  Resp:  18  Temp: 98.9 F (37.2 C) 98.4 F (36.9 C)  SpO2: 99% 98%    Intake/Output Summary (Last 24 hours) at 01/28/2019 1140 Last data filed at 01/28/2019 0900 Gross per 24 hour  Intake 660 ml  Output 1400 ml  Net -740 ml   Filed Weights   01/23/19  0353 01/23/19 2146 01/24/19 2110  Weight: 58.6 kg 59.6 kg 58.5 kg    Exam:   General: Somewhat frail-appearing otherwise no acute distress  Cardiovascular:  Normal rate and rhythm no murmur gallop or rub  Respiratory: No effort breath sounds clear I hear no crackles no wheezes  Abdomen: Soft nondistended positive bowel sounds throughout nontender to palpation no guarding or rebounding  Musculoskeletal: joints without swelling/erythema   Data Reviewed: Basic Metabolic Panel: Recent Labs  Lab 01/22/19 2053 01/23/19 1022 01/24/19 0354 01/26/19 0419 01/27/19 0413  NA 136 137 131* 136 137  K 3.4* 4.6 4.0 3.9 3.9  CL 113* 102 99 103 106  CO2 15* 23 20* 22 21*  GLUCOSE 103* 102* 90 93 93  BUN 13 16 14 22 17   CREATININE 0.93 1.16* 1.08* 1.24* 1.02*  CALCIUM 7.1* 9.4 9.1 8.7* 8.8*   Liver Function Tests: Recent Labs  Lab 01/23/19 1022  AST 30  ALT 16  ALKPHOS 84  BILITOT 1.3*  PROT 6.5  ALBUMIN 2.9*   No results for input(s): LIPASE, AMYLASE in the last 168 hours. No results for input(s): AMMONIA in the last 168 hours. CBC: Recent Labs  Lab 01/22/19 2053 01/23/19 1022 01/24/19 0354 01/26/19 0419 01/27/19 0413  WBC 12.3* 11.9* 11.2* 11.3* 11.0*  NEUTROABS 10.4* 8.9*  --   --   --   HGB 6.5* 9.8* 10.3* 9.3* 8.4*  HCT 22.9* 31.0* 32.5* 29.0* 27.2*  MCV 90.2 81.6 81.9 82.2 82.2  PLT 324 391 362 332 315   Cardiac Enzymes: No  results for input(s): CKTOTAL, CKMB, CKMBINDEX, TROPONINI in the last 168 hours. BNP (last 3 results) No results for input(s): BNP in the last 8760 hours.  ProBNP (last 3 results) No results for input(s): PROBNP in the last 8760 hours.  CBG: No results for input(s): GLUCAP in the last 168 hours.  Recent Results (from the past 240 hour(s))  SARS CORONAVIRUS 2 (TAT 6-24 HRS) Nasopharyngeal Nasopharyngeal Swab     Status: None   Collection Time: 01/22/19 10:09 PM   Specimen: Nasopharyngeal Swab  Result Value Ref Range Status   SARS  Coronavirus 2 NEGATIVE NEGATIVE Final    Comment: (NOTE) SARS-CoV-2 target nucleic acids are NOT DETECTED. The SARS-CoV-2 RNA is generally detectable in upper and lower respiratory specimens during the acute phase of infection. Negative results do not preclude SARS-CoV-2 infection, do not rule out co-infections with other pathogens, and should not be used as the sole basis for treatment or other patient management decisions. Negative results must be combined with clinical observations, patient history, and epidemiological information. The expected result is Negative. Fact Sheet for Patients: SugarRoll.be Fact Sheet for Healthcare Providers: https://www.woods-mathews.com/ This test is not yet approved or cleared by the [place] FDA and  has been authorized for detection and/or diagnosis of SARS-CoV-2 by FDA under an Emergency Use Authorization (EUA). This EUA will remain  in effect (meaning this test can be used) for the duration of the COVID-19 declaration under Section 56 4(b)(1) of the Act, 21 U.S.C. section 360bbb-3(b)(1), unless the authorization is terminated or revoked sooner. Performed at [facility], [place] [address], [place], [place] [address]      Studies: No results found.  Scheduled Meds: . amLODipine  5 mg Oral Daily  . cholecalciferol  5,000 Units Oral Q breakfast  . heparin  5,000 Units Subcutaneous Q8H  . metoprolol tartrate  25 mg Oral BID  . multivitamin with minerals  1 tablet Oral Q breakfast  . vitamin B-12  1,000 mcg Oral Q breakfast   Continuous Infusions:  Principal Problem:   Unresponsive episode Active Problems:   Hypertension   CKD (chronic kidney disease), stage III   SVT (supraventricular tachycardia) (HCC)   Anemia   Aortic insufficiency   DNR no code (do not resuscitate)    Time spent: 28 minutes    [place] NP [facility] . If 7PM-7AM, please contact night-coverage at  [email or web address], password [facility] 01/28/2019, 11:40 AM  LOS: 5 days

## 2019-01-28 NOTE — Progress Notes (Signed)
Patient's BP 180/67 HR 60. Patient's denies any c/o. Kirby,NP text paged. Order received.Brenae Lasecki, Wonda Cheng, Therapist, sports

## 2019-01-28 NOTE — Discharge Summary (Signed)
Physician Discharge Summary  Natalie Fitzgerald [id number] DOB: September 13, 1927 DOA: 01/22/2019  PCP: Martinique, Betty G, MD  Admit date: 01/22/2019 Discharge date: 01/28/2019  Admitted From: Home Discharge disposition: SNF: [facility]   Recommendations for Outpatient Follow-Up:   1. Cardiology follow-up 2. Adjust blood pressure medicines as able 3. CBC 1 week   Discharge Diagnosis:   Principal Problem:   Unresponsive episode Active Problems:   Hypertension   Aortic insufficiency   CKD (chronic kidney disease), stage III   DNR no code (do not resuscitate)   SVT (supraventricular tachycardia) ([place])   Anemia    Discharge Condition: Improved.  Diet recommendation: Low sodium, heart healthy.  Wound care: None.  Code status: dnr   History of Present Illness:   Natalie Fitzgerald is a 86 y.o. female with history of recently diagnosed hypertension anemia was brought to the ER after patient had an unresponsive episode and later was found to be in SVT.  Per daughter patient had a similar episode about 6 weeks ago and was taken to the ER at [facility] where patient was observed and discharged home.  At that time patient had sudden stiffening lasted for few minutes and resolved without any incontinence of urine or bowel or confusion.  Today patient was going to move to another seat when patient suddenly became unresponsive and was having generalized shaking mostly on one extremity.  Patient remained unconscious for at least 3 to 4 minutes.  EMS arrived and was found to be in SVT and per EMS report per the ER physician patient was easily arousable and answers questions.  Did not have any incontinence of urine or bowel.  Patient was given adenosine 1 dose following which SVT resolved.   Hospital Course by Problem:   1.  Unresponsive episode.  Likely related to SVT.  EEG study issuggestive of mild to moderate diffuse encephalopathy, nonspecific etiology. No seizures or epileptiform  discharges were seen throughout the recording.No metabolic derangement. No signs of infectous process.  No further episodes during hospitalization.  Daughter not able to provide 24-hour care.  #2.  SVT.  Responded to adenosine in the field.  So provided with 1 dose of IV metoprolol in the emergency department.  TSH within the limits of normal.  2D echo with an EF of 65%.  Evaluated by cardiology recommended not increasing beta-blocker due to history of bradycardia at night.  Follow-up with cards in 30 days -Continue beta-blocker  #3.  Anemia.   Trend hemoglobin Got 1 unit PRBC in the hospital No sign of bleeding  #4.  Hypertension.  Recently started on amlodipine by her primary care physician.  Amlodipine. beta-blocker was started.  Blood pressure remains elevated. -continue BB and amlodipine -add appresaline.  -monitor    Medical Consultants:   cards   Discharge Exam:   Vitals:   01/28/19 0522 01/28/19 0911  BP: (!) 160/61 (!) 160/59  Pulse: (!) 58 64  Resp:  18  Temp: 98.9 F (37.2 C) 98.4 F (36.9 C)  SpO2: 99% 98%   Vitals:   01/28/19 0028 01/28/19 0259 01/28/19 0522 01/28/19 0911  BP: (!) 180/67 (!) 151/59 (!) 160/61 (!) 160/59  Pulse: 60  (!) 58 64  Resp:    18  Temp:   98.9 F (37.2 C) 98.4 F (36.9 C)  TempSrc:   Oral Oral  SpO2:   99% 98%  Weight:        General exam: Appears calm and  comfortable.   The results of significant diagnostics from this hospitalization (including imaging, microbiology, ancillary and laboratory) are listed below for reference.     Procedures and Diagnostic Studies:   EEG  Result Date: 01/23/2019 Lora Havens, MD     01/23/2019  1:00 PM Patient Name: Natalie Fitzgerald MRN: [id number] Epilepsy Attending: Lora Havens Referring Physician/Provider: Dr. Gean Birchwood Date: 01/23/2019 Duration: 25.20 minutes Patient history: 86 year old female who presented with an episode of transient alteration of awareness.  EEG to  evaluate for seizures. Level of alertness: Awake AEDs during EEG study: None Technical aspects: This EEG study was done with scalp electrodes positioned according to the 10-20 International system of electrode placement. Electrical activity was acquired at a sampling rate of 500Hz  and reviewed with a high frequency filter of 70Hz  and a low frequency filter of 1Hz . EEG data were recorded continuously and digitally stored. Description: EEG showed continuous generalized 3 to 6 Hz theta-delta slowing.  No clear posterior dominant rhythm was seen.  Hyperventilation and photic summation were not performed. Abnormalities -Continuous slow, generalized IMPRESSION: This study is suggestive of mild to moderate diffuse encephalopathy, nonspecific etiology. No seizures or epileptiform discharges were seen throughout the recording.   ECHOCARDIOGRAM COMPLETE  Result Date: 01/23/2019   ECHOCARDIOGRAM REPORT   Patient Name:   Natalie Fitzgerald Date of Exam: 01/23/2019 Medical Rec #:  [id number]       Height:       64.0 in Accession #:    [id number]      Weight:       129.2 lb Date of Birth:  15-Sep-1927      BSA:          1.62 m Patient Age:    52 years        BP:           190/86 mmHg Patient Gender: F               HR:           74 bpm. Exam Location:  Inpatient Procedure: 2D Echo                                 MODIFIED REPORT:     This report was modified by Cherlynn Kaiser MD on 01/23/2019 due to syngo                                 template error.  Indications:     Syncope 780.2 / R55  History:         Patient has prior history of Echocardiogram examinations, most                  recent 01/21/2014. Arrythmias:SVT; Risk Factors:Dyslipidemia and                  Hypertension.  Sonographer:     Jaquita Folds Referring Phys:  [place] Diagnosing Phys: Cherlynn Kaiser MD IMPRESSIONS  1. Left ventricular ejection fraction, by visual estimation, is 65%. The left ventricle has normal function. There is mildly  increased left ventricular hypertrophy. Angulation of the aorta without significant LVOT flow acceleration, no evidence of outflow tract obstruction.  2. Left ventricular diastolic parameters are consistent with Grade I diastolic dysfunction (impaired relaxation).  3. Global right ventricle has normal systolic function.The right  ventricular size is normal. No increase in right ventricular wall thickness.  4. Left atrial size was mild-moderately dilated.  5. The mitral valve is normal in structure. Mild mitral valve regurgitation. No evidence of mitral stenosis.  6. The tricuspid valve is normal in structure. Trivial TR.  7. The aortic valve is normal in structure. Aortic valve regurgitation is mild to moderate. No evidence of aortic valve sclerosis or stenosis.  8. The pulmonic valve was not well visualized. Pulmonic valve regurgitation is not visualized.  9. TR signal is inadequate for assessing pulmonary artery systolic pressure. 10. The inferior vena cava is normal in size with greater than 50% respiratory variability, suggesting right atrial pressure of 3 mmHg. FINDINGS  Left Ventricle: Left ventricular ejection fraction, by visual estimation, is 65%. The left ventricle has normal function. The left ventricle has no regional wall motion abnormalities. There is mildly increased left ventricular hypertrophy. Left ventricular diastolic parameters are consistent with Grade I diastolic dysfunction (impaired relaxation). Indeterminate filling pressures. Right Ventricle: The right ventricular size is normal. No increase in right ventricular wall thickness. Global RV systolic function is has normal systolic function. Left Atrium: Left atrial size was mild-moderately dilated. Right Atrium: Right atrial size was normal in size Pericardium: There is no evidence of pericardial effusion. Presence of pericardial fat pad. Mitral Valve: The mitral valve is normal in structure. Mild mitral valve regurgitation. No evidence of  mitral valve stenosis by observation. Tricuspid Valve: The tricuspid valve is normal in structure. Tricuspid valve regurgitation is trivial. Aortic Valve: The aortic valve is normal in structure. Aortic valve regurgitation is mild to moderate. Aortic regurgitation PHT measures 465 msec. The aortic valve is structurally normal, with no evidence of sclerosis or stenosis. There is mild calcification of the aortic valve. Pulmonic Valve: The pulmonic valve was not well visualized. Pulmonic valve regurgitation is not visualized. Pulmonic regurgitation is not visualized. Aorta: The aortic root is normal in size and structure and the aortic arch was not well visualized. Venous: The inferior vena cava is normal in size with greater than 50% respiratory variability, suggesting right atrial pressure of 3 mmHg. IAS/Shunts: No atrial level shunt detected by color flow Doppler. There is no evidence of a patent foramen ovale. There is no evidence of an atrial septal defect.  LEFT VENTRICLE PLAX 2D LVIDd:         3.20 cm  Diastology LVIDs:         2.50 cm  LV e' lateral:   6.74 cm/s LV PW:         1.20 cm  LV E/e' lateral: 11.6 LV IVS:        1.10 cm  LV e' medial:    5.33 cm/s LVOT diam:     1.80 cm  LV E/e' medial:  14.7 LV SV:         19 ml LV SV Index:   11.43 LVOT Area:     2.54 cm  RIGHT VENTRICLE RV S prime:     12.10 cm/s TAPSE (M-mode): 1.6 cm LEFT ATRIUM             Index LA diam:        3.90 cm 2.40 cm/m LA Vol (A2C):   62.9 ml 38.71 ml/m LA Vol (A4C):   61.3 ml 37.73 ml/m LA Biplane Vol: 65.9 ml 40.56 ml/m  AORTIC VALVE LVOT Vmax:   117.00 cm/s LVOT Vmean:  78.400 cm/s LVOT VTI:    0.235 m AI PHT:  465 msec  AORTA Ao Root diam: 2.80 cm MITRAL VALVE MV Area (PHT): 3.27 cm              SHUNTS MV PHT:        67.28 msec            Systemic VTI:  0.24 m MV Decel Time: 232 msec              Systemic Diam: 1.80 cm MV E velocity: 78.20 cm/s  103 cm/s MV A velocity: 103.00 cm/s 70.3 cm/s MV E/A ratio:  0.76        1.5   Cherlynn Kaiser MD Electronically signed by Cherlynn Kaiser MD Signature Date/Time: 01/23/2019/1:35:34 PM    Final (Updated)      Labs:   Basic Metabolic Panel: Recent Labs  Lab 01/22/19 2053 01/22/19 2053 01/23/19 1022 01/23/19 1022 01/24/19 0354 01/24/19 0354 01/26/19 0419 01/27/19 0413  NA 136  --  137  --  131*  --  136 137  K 3.4*   < > 4.6   < > 4.0   < > 3.9 3.9  CL 113*  --  102  --  99  --  103 106  CO2 15*  --  23  --  20*  --  22 21*  GLUCOSE 103*  --  102*  --  90  --  93 93  BUN 13  --  16  --  14  --  22 17  CREATININE 0.93  --  1.16*  --  1.08*  --  1.24* 1.02*  CALCIUM 7.1*  --  9.4  --  9.1  --  8.7* 8.8*   < > = values in this interval not displayed.   GFR Estimated Creatinine Clearance: 32.3 mL/min (A) (by C-G formula based on SCr of 1.02 mg/dL (H)). Liver Function Tests: Recent Labs  Lab 01/23/19 1022  AST 30  ALT 16  ALKPHOS 84  BILITOT 1.3*  PROT 6.5  ALBUMIN 2.9*   No results for input(s): LIPASE, AMYLASE in the last 168 hours. No results for input(s): AMMONIA in the last 168 hours. Coagulation profile No results for input(s): INR, PROTIME in the last 168 hours.  CBC: Recent Labs  Lab 01/22/19 2053 01/23/19 1022 01/24/19 0354 01/26/19 0419 01/27/19 0413  WBC 12.3* 11.9* 11.2* 11.3* 11.0*  NEUTROABS 10.4* 8.9*  --   --   --   HGB 6.5* 9.8* 10.3* 9.3* 8.4*  HCT 22.9* 31.0* 32.5* 29.0* 27.2*  MCV 90.2 81.6 81.9 82.2 82.2  PLT 324 391 362 332 315   Cardiac Enzymes: No results for input(s): CKTOTAL, CKMB, CKMBINDEX, TROPONINI in the last 168 hours. BNP: Invalid input(s): POCBNP CBG: No results for input(s): GLUCAP in the last 168 hours. D-Dimer No results for input(s): DDIMER in the last 72 hours. Hgb A1c No results for input(s): HGBA1C in the last 72 hours. Lipid Profile No results for input(s): CHOL, HDL, LDLCALC, TRIG, CHOLHDL, LDLDIRECT in the last 72 hours. Thyroid function studies No results for input(s): TSH, T4TOTAL,  T3FREE, THYROIDAB in the last 72 hours.  Invalid input(s): FREET3 Anemia work up No results for input(s): VITAMINB12, FOLATE, FERRITIN, TIBC, IRON, RETICCTPCT in the last 72 hours. Microbiology Recent Results (from the past 240 hour(s))  SARS CORONAVIRUS 2 (TAT 6-24 HRS) Nasopharyngeal Nasopharyngeal Swab     Status: None   Collection Time: 01/22/19 10:09 PM   Specimen: Nasopharyngeal Swab  Result Value Ref Range Status  SARS Coronavirus 2 NEGATIVE NEGATIVE Final    Comment: (NOTE) SARS-CoV-2 target nucleic acids are NOT DETECTED. The SARS-CoV-2 RNA is generally detectable in upper and lower respiratory specimens during the acute phase of infection. Negative results do not preclude SARS-CoV-2 infection, do not rule out co-infections with other pathogens, and should not be used as the sole basis for treatment or other patient management decisions. Negative results must be combined with clinical observations, patient history, and epidemiological information. The expected result is Negative. Fact Sheet for Patients: SugarRoll.be Fact Sheet for Healthcare Providers: https://www.woods-mathews.com/ This test is not yet approved or cleared by the [place] FDA and  has been authorized for detection and/or diagnosis of SARS-CoV-2 by FDA under an Emergency Use Authorization (EUA). This EUA will remain  in effect (meaning this test can be used) for the duration of the COVID-19 declaration under Section 56 4(b)(1) of the Act, 21 U.S.C. section 360bbb-3(b)(1), unless the authorization is terminated or revoked sooner. Performed at [facility], [place] [address], [place], [place] [address]      Discharge Instructions:   Discharge Instructions    Increase activity slowly   Complete by: As directed      Allergies as of 01/28/2019   No Known Allergies     Medication List    TAKE these medications   amLODipine 5 MG tablet Commonly  known as: NORVASC Take 1 tablet (5 mg total) by mouth daily. Start taking on: January 29, 2019 What changed:   medication strength  how much to take   cyanocobalamin 1000 MCG tablet Take 1 tablet (1,000 mcg total) by mouth daily with breakfast. Start taking on: January 29, 2019 What changed:   medication strength  how much to take   hydrALAZINE 10 MG tablet Commonly known as: APRESOLINE Take 1 tablet (10 mg total) by mouth every 8 (eight) hours.   metoprolol tartrate 25 MG tablet Commonly known as: LOPRESSOR Take 1 tablet (25 mg total) by mouth 2 (two) times daily.   One-A-Day Womens 50 Plus Tabs Take 1 tablet by mouth daily with breakfast.   Vitamin D-3 125 MCG (5000 UT) Tabs Take 5,000 Units by mouth daily with breakfast.      Follow-up Information    Lorretta Harp, MD Follow up.   Specialties: Cardiology, Radiology Why: the office will call to arrange appt in 30-35 days, this will allow time for your rehab hospitalization.  if you have not heard by valentines day call the office.  Contact information: [address] [place] [address] [phone]        Martinique, Betty G, MD Follow up in 1 week(s).   Specialty: Family Medicine Contact information: [place] [address] [phone]            Time coordinating discharge: 35 min  Signed:  Geradine Girt DO  [facility] 01/28/2019, 12:49 PM

## 2019-01-28 NOTE — TOC Transition Note (Signed)
Transition of Care [facility]) - CM/SW Discharge Note 01/28/19 - Discharged to [facility] Room [place]   Patient Details  Name: Natalie Fitzgerald MRN: [id number] Date of Birth: 02/27/1927  Transition of Care [facility]) CM/SW Contact:  Sable Feil, LCSW Phone Number: 01/28/2019, 5:43 PM   Clinical Narrative: Patient medically stable for discharge to [facility], transported by non-emergency transport. Insurance authorization received and d/c clinicals transmitted to facility. Daughter [name] was at the bedside today and was advised of patient's discharge.      Final next level of care: Skilled Nursing Facility Barriers to Discharge: No Barriers Identified   Patient Goals and CMS Choice Patient states their goals for this hospitalization and ongoing recovery are:: Daughter wants patient to get medically better CMS Medicare.gov Compare Post Acute Care list provided to:: Patient Represenative (must comment)(Daughter [name] [id number]) Choice offered to / list presented to : Adult Children  Discharge Placement PASRR number recieved: 01/24/19            Patient chooses bed at: [address] Patient to be transferred to facility by: Non-emergency ambulance Name of family member notified: Daughter [name] at the bedside Patient and family notified of of transfer: 01/28/19  Discharge Plan and Services In-house Referral: Clinical Social Work Discharge Planning Services: Other - See comment(SNF Placement)                                 Social Determinants of Health (SDOH) Interventions  No SDOH interventions needed at this time   Readmission Risk Interventions No flowsheet data found.

## 2019-01-28 NOTE — Progress Notes (Signed)
Physical Therapy Treatment Patient Details Name: Natalie Fitzgerald MRN: [id number] DOB: 12/14/1927 Today's Date: 01/28/2019    History of Present Illness 86 yo female with onset of unresponsive episode at home with daughter was found to be in SVT at ED.  Had encephalopathy on EEG, transfused and no seizure activity noted.  PMHx:  aortic valve disorder, glaucoma, HTN, CKD    PT Comments    Pt was seen for mobility with ROM to legs on side of bed, then standing with mod assist on RW for standing balance control and steps.  Pt is unable to unload her RLE to step, but also is not balancing without mod assist to step.  Follow her acutely for mobility and balance, to increase quality of gait and LE strength.     Follow Up Recommendations  SNF     Equipment Recommendations  None recommended by PT    Recommendations for Other Services       Precautions / Restrictions Precautions Precautions: Fall Precaution Comments: monitor vitals Restrictions Weight Bearing Restrictions: No Other Position/Activity Restrictions: confusion    Mobility  Bed Mobility Overal bed mobility: Needs Assistance Bed Mobility: Supine to Sit;Sit to Supine     Supine to sit: Min assist;Mod assist Sit to supine: Mod assist   General bed mobility comments: requires assist to lift trunk and to get legs back onto bed  Transfers Overall transfer level: Needs assistance Equipment used: Rolling walker (2 wheeled);1 person hand held assist Transfers: Sit to/from Stand Sit to Stand: Min assist;Mod assist         General transfer comment: min once up but mod to power up  Ambulation/Gait             General Gait Details: minor sidestepping with RW but requires dense cues   Stairs             Wheelchair Mobility    Modified Rankin (Stroke Patients Only)       Balance Overall balance assessment: Needs assistance Sitting-balance support: Feet supported Sitting balance-Leahy Scale: Fair      Standing balance support: Bilateral upper extremity supported;During functional activity Standing balance-Leahy Scale: Poor                              Cognition Arousal/Alertness: Awake/alert Behavior During Therapy: Flat affect Overall Cognitive Status: Impaired/Different from baseline Area of Impairment: Safety/judgement;Awareness;Problem solving                         Safety/Judgement: Decreased awareness of safety;Decreased awareness of deficits Awareness: Intellectual Problem Solving: Slow processing;Requires verbal cues        Exercises      General Comments General comments (skin integrity, edema, etc.): pt was with her daugther and is expecting to DC to the SNF, with daughter assisting by encouraging her to step      Pertinent Vitals/Pain Pain Assessment: No/denies pain    Home Living                      Prior Function            PT Goals (current goals can now be found in the care plan section) Acute Rehab PT Goals Patient Stated Goal: to go home Progress towards PT goals: Progressing toward goals    Frequency    Min 2X/week      PT Plan Current plan remains  appropriate    Co-evaluation              AM-PAC PT "6 Clicks" Mobility   Outcome Measure  Help needed turning from your back to your side while in a flat bed without using bedrails?: A Lot Help needed moving from lying on your back to sitting on the side of a flat bed without using bedrails?: A Lot Help needed moving to and from a bed to a chair (including a wheelchair)?: A Lot Help needed standing up from a chair using your arms (e.g., wheelchair or bedside chair)?: A Lot Help needed to walk in hospital room?: A Lot Help needed climbing 3-5 steps with a railing? : Total 6 Click Score: 11    End of Session Equipment Utilized During Treatment: Gait belt Activity Tolerance: Patient limited by fatigue;Treatment limited secondary to medical  complications (Comment) Patient left: in bed;with call bell/phone within reach;with bed alarm set;with family/visitor present Nurse Communication: Mobility status PT Visit Diagnosis: Muscle weakness (generalized) (M62.81);Other abnormalities of gait and mobility (R26.89);Difficulty in walking, not elsewhere classified (R26.2)     Time: [id number] PT Time Calculation (min) (ACUTE ONLY): 21 min  Charges:  $Therapeutic Exercise: 8-22 mins                   Ramond Dial 01/28/2019, 7:54 PM   Mee Hives, PT MS Acute Rehab Dept. Number: [place] [facility] [place]

## 2019-01-28 NOTE — Progress Notes (Signed)
Patient discharged to [facility] by [facility]. Belongings sent with patient. Prestina Raigoza, Wonda Cheng, Therapist, sports

## 2019-01-29 DIAGNOSIS — D6489 Other specified anemias: Secondary | ICD-10-CM | POA: Diagnosis not present

## 2019-01-29 DIAGNOSIS — I471 Supraventricular tachycardia: Secondary | ICD-10-CM | POA: Diagnosis not present

## 2019-01-29 DIAGNOSIS — E538 Deficiency of other specified B group vitamins: Secondary | ICD-10-CM | POA: Diagnosis not present

## 2019-01-29 DIAGNOSIS — E7849 Other hyperlipidemia: Secondary | ICD-10-CM | POA: Diagnosis not present

## 2019-01-29 DIAGNOSIS — I10 Essential (primary) hypertension: Secondary | ICD-10-CM | POA: Diagnosis not present

## 2019-01-30 DIAGNOSIS — I10 Essential (primary) hypertension: Secondary | ICD-10-CM | POA: Diagnosis not present

## 2019-01-30 DIAGNOSIS — G9349 Other encephalopathy: Secondary | ICD-10-CM | POA: Diagnosis not present

## 2019-01-30 DIAGNOSIS — I471 Supraventricular tachycardia: Secondary | ICD-10-CM | POA: Diagnosis not present

## 2019-01-30 DIAGNOSIS — D6489 Other specified anemias: Secondary | ICD-10-CM | POA: Diagnosis not present

## 2019-01-30 DIAGNOSIS — G4089 Other seizures: Secondary | ICD-10-CM | POA: Diagnosis not present

## 2019-02-04 DIAGNOSIS — I471 Supraventricular tachycardia: Secondary | ICD-10-CM | POA: Diagnosis not present

## 2019-02-04 DIAGNOSIS — E538 Deficiency of other specified B group vitamins: Secondary | ICD-10-CM | POA: Diagnosis not present

## 2019-02-04 DIAGNOSIS — I10 Essential (primary) hypertension: Secondary | ICD-10-CM | POA: Diagnosis not present

## 2019-02-04 DIAGNOSIS — D6489 Other specified anemias: Secondary | ICD-10-CM | POA: Diagnosis not present

## 2019-02-04 DIAGNOSIS — E7849 Other hyperlipidemia: Secondary | ICD-10-CM | POA: Diagnosis not present

## 2019-02-04 DIAGNOSIS — G4089 Other seizures: Secondary | ICD-10-CM | POA: Diagnosis not present

## 2019-02-11 DIAGNOSIS — I471 Supraventricular tachycardia: Secondary | ICD-10-CM | POA: Diagnosis not present

## 2019-02-11 DIAGNOSIS — I10 Essential (primary) hypertension: Secondary | ICD-10-CM | POA: Diagnosis not present

## 2019-02-11 DIAGNOSIS — E538 Deficiency of other specified B group vitamins: Secondary | ICD-10-CM | POA: Diagnosis not present

## 2019-02-11 DIAGNOSIS — D6489 Other specified anemias: Secondary | ICD-10-CM | POA: Diagnosis not present

## 2019-02-11 DIAGNOSIS — G4089 Other seizures: Secondary | ICD-10-CM | POA: Diagnosis not present

## 2019-02-14 ENCOUNTER — Telehealth: Payer: Self-pay | Admitting: Family Medicine

## 2019-02-14 NOTE — Telephone Encounter (Signed)
It is okay to give verbal authorization to requested services. Thanks, BJ 

## 2019-02-14 NOTE — Telephone Encounter (Signed)
Verbal orders given to [place].

## 2019-02-14 NOTE — Telephone Encounter (Signed)
Sandy from [facility] needs to verbal orders to do evaluation for at home physical therapy, PTOP and ST.   Lovey Newcomer can be reached at [phone]   She is hoping to see her Tuesday 02/18/19

## 2019-02-18 ENCOUNTER — Telehealth: Payer: Self-pay | Admitting: Family Medicine

## 2019-02-18 DIAGNOSIS — N1832 Chronic kidney disease, stage 3b: Secondary | ICD-10-CM | POA: Diagnosis not present

## 2019-02-18 DIAGNOSIS — R569 Unspecified convulsions: Secondary | ICD-10-CM | POA: Diagnosis not present

## 2019-02-18 DIAGNOSIS — I1310 Hypertensive heart and chronic kidney disease without heart failure, with stage 1 through stage 4 chronic kidney disease, or unspecified chronic kidney disease: Secondary | ICD-10-CM | POA: Diagnosis not present

## 2019-02-18 DIAGNOSIS — I351 Nonrheumatic aortic (valve) insufficiency: Secondary | ICD-10-CM | POA: Diagnosis not present

## 2019-02-18 DIAGNOSIS — Z8781 Personal history of (healed) traumatic fracture: Secondary | ICD-10-CM | POA: Diagnosis not present

## 2019-02-18 DIAGNOSIS — D631 Anemia in chronic kidney disease: Secondary | ICD-10-CM | POA: Diagnosis not present

## 2019-02-18 DIAGNOSIS — H409 Unspecified glaucoma: Secondary | ICD-10-CM | POA: Diagnosis not present

## 2019-02-18 DIAGNOSIS — R41841 Cognitive communication deficit: Secondary | ICD-10-CM | POA: Diagnosis not present

## 2019-02-18 DIAGNOSIS — I471 Supraventricular tachycardia: Secondary | ICD-10-CM | POA: Diagnosis not present

## 2019-02-18 NOTE — Telephone Encounter (Signed)
Need PT approved and okay for occupation therapy and a Education officer, museum.   Evelina Dun [facility] [phone]

## 2019-02-20 DIAGNOSIS — H409 Unspecified glaucoma: Secondary | ICD-10-CM | POA: Diagnosis not present

## 2019-02-20 DIAGNOSIS — Z8781 Personal history of (healed) traumatic fracture: Secondary | ICD-10-CM | POA: Diagnosis not present

## 2019-02-20 DIAGNOSIS — R569 Unspecified convulsions: Secondary | ICD-10-CM | POA: Diagnosis not present

## 2019-02-20 DIAGNOSIS — I471 Supraventricular tachycardia: Secondary | ICD-10-CM | POA: Diagnosis not present

## 2019-02-20 DIAGNOSIS — D631 Anemia in chronic kidney disease: Secondary | ICD-10-CM | POA: Diagnosis not present

## 2019-02-20 DIAGNOSIS — N1832 Chronic kidney disease, stage 3b: Secondary | ICD-10-CM | POA: Diagnosis not present

## 2019-02-20 DIAGNOSIS — I1310 Hypertensive heart and chronic kidney disease without heart failure, with stage 1 through stage 4 chronic kidney disease, or unspecified chronic kidney disease: Secondary | ICD-10-CM | POA: Diagnosis not present

## 2019-02-20 DIAGNOSIS — I351 Nonrheumatic aortic (valve) insufficiency: Secondary | ICD-10-CM | POA: Diagnosis not present

## 2019-02-20 DIAGNOSIS — R41841 Cognitive communication deficit: Secondary | ICD-10-CM | POA: Diagnosis not present

## 2019-02-20 NOTE — Telephone Encounter (Signed)
Margaret Orthoptist) at [place] [facility] [employer] called to get verbal approval for patient  PT. She can be called directly and the approval can be left on her vm.   Anette Guarneri at [facility] [phone]

## 2019-02-21 ENCOUNTER — Telehealth: Payer: Self-pay | Admitting: Family Medicine

## 2019-02-21 ENCOUNTER — Encounter: Payer: Self-pay | Admitting: Family Medicine

## 2019-02-21 ENCOUNTER — Telehealth (INDEPENDENT_AMBULATORY_CARE_PROVIDER_SITE_OTHER): Payer: Medicare HMO | Admitting: Family Medicine

## 2019-02-21 VITALS — BP 110/70 | HR 62

## 2019-02-21 DIAGNOSIS — I10 Essential (primary) hypertension: Secondary | ICD-10-CM | POA: Diagnosis not present

## 2019-02-21 DIAGNOSIS — H409 Unspecified glaucoma: Secondary | ICD-10-CM | POA: Diagnosis not present

## 2019-02-21 DIAGNOSIS — R569 Unspecified convulsions: Secondary | ICD-10-CM | POA: Diagnosis not present

## 2019-02-21 DIAGNOSIS — N1832 Chronic kidney disease, stage 3b: Secondary | ICD-10-CM | POA: Diagnosis not present

## 2019-02-21 DIAGNOSIS — G3184 Mild cognitive impairment, so stated: Secondary | ICD-10-CM

## 2019-02-21 DIAGNOSIS — I471 Supraventricular tachycardia: Secondary | ICD-10-CM | POA: Diagnosis not present

## 2019-02-21 DIAGNOSIS — Z8781 Personal history of (healed) traumatic fracture: Secondary | ICD-10-CM | POA: Diagnosis not present

## 2019-02-21 DIAGNOSIS — D631 Anemia in chronic kidney disease: Secondary | ICD-10-CM | POA: Diagnosis not present

## 2019-02-21 DIAGNOSIS — R41841 Cognitive communication deficit: Secondary | ICD-10-CM | POA: Diagnosis not present

## 2019-02-21 DIAGNOSIS — I1310 Hypertensive heart and chronic kidney disease without heart failure, with stage 1 through stage 4 chronic kidney disease, or unspecified chronic kidney disease: Secondary | ICD-10-CM | POA: Diagnosis not present

## 2019-02-21 DIAGNOSIS — I351 Nonrheumatic aortic (valve) insufficiency: Secondary | ICD-10-CM | POA: Diagnosis not present

## 2019-02-21 DIAGNOSIS — D649 Anemia, unspecified: Secondary | ICD-10-CM

## 2019-02-21 MED ORDER — METOPROLOL TARTRATE 25 MG PO TABS: 25.0000 mg | ORAL_TABLET | Freq: Two times a day (BID) | ORAL | 3 refills | Status: DC

## 2019-02-21 MED ORDER — AMLODIPINE BESYLATE 5 MG PO TABS: 5.0000 mg | ORAL_TABLET | Freq: Every day | ORAL | 3 refills | Status: DC

## 2019-02-21 MED ORDER — HYDRALAZINE HCL 10 MG PO TABS: 10.0000 mg | ORAL_TABLET | Freq: Three times a day (TID) | ORAL | 3 refills | Status: DC

## 2019-02-21 NOTE — Telephone Encounter (Signed)
He is okay to give verbal authorization for requested services. Thanks, BJ

## 2019-02-21 NOTE — Telephone Encounter (Signed)
Rx's sent in. °

## 2019-02-21 NOTE — Telephone Encounter (Addendum)
Margaret Orthoptist) [facility] is calling again for verbal order for pt. She said they can not go back out until verbal approval is given.    Anette Guarneri at [facility] [phone] Ext.[phone]

## 2019-02-21 NOTE — Telephone Encounter (Signed)
Verbal given 

## 2019-02-21 NOTE — Telephone Encounter (Signed)
Patient needs all her refills sent to the pharmacy.  She was sent home from rehab with 8 days worth  [facility] [phone] [place], [place] Phone:  [phone]  Fax:  [phone]

## 2019-02-21 NOTE — Progress Notes (Signed)
Virtual Visit via Video Note   I connected with Natalie  Gullickson and her daughter on 02/21/19 by a video enabled telemedicine application and verified that I am speaking with the correct person using two identifiers.  Location patient: home Location provider:work office Persons participating in the virtual visit: patient, provider  I discussed the limitations of evaluation and management by telemedicine and the availability of in person appointments. The patient expressed understanding and agreed to proceed.   HPI: Natalie Fitzgerald is a 86 yo female with hx of HTN,CKD III,HLD, and aortic valve disease who is following on recent hospitalization. She was admitted on 01/22/2019 and discharged on 01/28/2019 to a SNF.  Discharged from SNF on 02/13/2019.  She was taken to the ER by EMS because unresponsive episode, LOC for a couple minutes.  She was found by daughter standing next to bedside commode and urine on the floor. When sitting on commode she had a shaking episode for a min. EMS found elevated HR in the 190's and hypotension (95/58). Treated with Adenosine, which did not help with tachycardia. Started on IV Metoprolol.  Anemia,she had blood transfusion x 2, according to pt,she had one while she was at the SNF. She has not noted melena,or gross hematuria. 3-4 days ago she had a hard stool and had to strain; noted a "dot of red bright blood" x 1, she has not had other episode and no prior hx. Having some loose and soft stools q 2 days,which is not unusual 2 days of "rumbling" stomach sound and gas. No abdominal pain,nausea,or vomiting.  Lab Results  Component Value Date   WBC 11.0 (H) 01/27/2019   HGB 8.4 (L) 01/27/2019   HCT 27.2 (L) 01/27/2019   MCV 82.2 01/27/2019   PLT 315 01/27/2019   Lab Results  Component Value Date   IRON 285 (H) 01/23/2019   TIBC 322 01/23/2019   FERRITIN 26 01/23/2019   Lab Results  Component Value Date   CREATININE 1.02 (H) 01/27/2019   BUN 17 01/27/2019   NA 137 01/27/2019   K 3.9 01/27/2019   CL 106 01/27/2019   CO2 21 (L) 01/27/2019    Negative for fever,chills,CP,dyspnea,palpitations,dyspnea,orthopnea,or PND. No urinary symptoms.  She is in a wheel chair most of the time. Walked some with her walker during PT today.  No decubitus ulcers. Appetite is good.  She has had some hiccups.  Lab Results  Component Value Date   ALT 16 01/23/2019   AST 30 01/23/2019   ALKPHOS 84 01/23/2019   BILITOT 1.3 (H) 01/23/2019   HH service was arranged. Started PT today and planning on doing it 2 times per week.   Her daughter mentions that for years she has had episodes of confusion than are more noticeable at night. This seems to be stable. Negative for headache or focal deficit.   ROS: See pertinent positives and negatives per HPI.   Past Medical History:  Diagnosis Date  . Aortic valve disorders    moderate aortic insufficiency  . Atypical mole    nose  . Dizziness   . Glaucoma   . Nipple problem     Past Surgical History:  Procedure Laterality Date  . APPENDECTOMY    . BACK SURGERY      Family History  Problem Relation Age of Onset  . Cancer Mother        kidney  . Alcohol abuse Father     Social History   Socioeconomic History  . Marital status:  Married    Spouse name: Not on file  . Number of children: Not on file  . Years of education: Not on file  . Highest education level: Not on file  Occupational History  . Not on file  Tobacco Use  . Smoking status: Never Smoker  . Smokeless tobacco: Never Used  Substance and Sexual Activity  . Alcohol use: No  . Drug use: No  . Sexual activity: Not on file  Other Topics Concern  . Not on file  Social History Narrative  . Not on file   Social Determinants of Health   Financial Resource Strain:   . Difficulty of Paying Living Expenses: Not on file  Food Insecurity:   . Worried About Charity fundraiser in the Last Year: Not on file  . Ran Out of Food  in the Last Year: Not on file  Transportation Needs:   . Lack of Transportation (Medical): Not on file  . Lack of Transportation (Non-Medical): Not on file  Physical Activity:   . Days of Exercise per Week: Not on file  . Minutes of Exercise per Session: Not on file  Stress:   . Feeling of Stress : Not on file  Social Connections:   . Frequency of Communication with Friends and Family: Not on file  . Frequency of Social Gatherings with Friends and Family: Not on file  . Attends Religious Services: Not on file  . Active Member of Clubs or Organizations: Not on file  . Attends Archivist Meetings: Not on file  . Marital Status: Not on file  Intimate Partner Violence:   . Fear of Current or Ex-Partner: Not on file  . Emotionally Abused: Not on file  . Physically Abused: Not on file  . Sexually Abused: Not on file    Current Outpatient Medications:  .  Cholecalciferol (VITAMIN D-3) 125 MCG (5000 UT) TABS, Take 5,000 Units by mouth daily with breakfast., Disp: , Rfl:  .  Multiple Vitamins-Minerals (ONE-A-DAY WOMENS 50 PLUS) TABS, Take 1 tablet by mouth daily with breakfast., Disp: , Rfl:  .  vitamin B-12 1000 MCG tablet, Take 1 tablet (1,000 mcg total) by mouth daily with breakfast., Disp:  , Rfl:  .  amLODipine (NORVASC) 5 MG tablet, Take 1 tablet (5 mg total) by mouth daily., Disp: 30 tablet, Rfl: 3 .  hydrALAZINE (APRESOLINE) 10 MG tablet, Take 1 tablet (10 mg total) by mouth every 8 (eight) hours., Disp: 90 tablet, Rfl: 3 .  metoprolol tartrate (LOPRESSOR) 25 MG tablet, Take 1 tablet (25 mg total) by mouth 2 (two) times daily., Disp: 60 tablet, Rfl: 3  EXAM:  VITALS per patient if applicable:BP 123456   Pulse 62   GENERAL: alert, oriented, appears well and in no acute distress  HEENT: atraumatic, conjunctiva clear, no obvious abnormalities on inspection.  NECK: normal movements of the head and neck  LUNGS: on inspection no signs of respiratory distress, breathing  rate appears normal, no obvious gross SOB, gasping or wheezing  CV: no obvious cyanosis  Natalie: moves all visible extremities without noticeable abnormality  PSYCH/NEURO: pleasant and cooperative, no obvious depression or anxiety, speech and thought processing grossly intact Disoriented in time, "02/2018."Oriented in person and place. She remembers the ex-president of [place].  ASSESSMENT AND PLAN:  Discussed the following assessment and plan:  MCI (mild cognitive impairment) We discussed possible etiologies, it seems to be gradually getting worse. ? Alzheimer's dementia. General tips about activities that may help with  memory. We also discussed pharmacologic options, for now we will hold on adding new meds.  Essential hypertension BP adequately controlled. Continue monitoring BP regularly. Low salt diet. No changes in current management.  Anemia, unspecified type Not sure about etiology. 1 episode of blood on stool , negative fecal occult blood (01/22/19). She is not sure if CBC was done in SNF. Will plan on rechecking CBC next visit,before if needed.  SVT (supraventricular tachycardia) (HCC) Problem is well controlled. Continue Metoprolol tartrate 25 mg bid. Some side effects of meds discussed. Has f/u appt with Dr Gwenlyn Found on 02/27/19.   Stage 3b chronic kidney disease Otherwise stable:Cr 1.25 and e GFR mid 40's. Adequate BP controlled. Low salt diet. Avoid NSAID's.  I discussed the assessment and treatment plan with the patient. Natalie Mollison and her daughter were provided an opportunity to ask questions and all were answered. They agreed with the plan and demonstrated an understanding of the instructions.    Return in about 3 months (around 05/21/2019) for HTN,MCI.    Dakin Madani Martinique, MD

## 2019-02-22 ENCOUNTER — Encounter: Payer: Self-pay | Admitting: Family Medicine

## 2019-02-25 DIAGNOSIS — N1832 Chronic kidney disease, stage 3b: Secondary | ICD-10-CM | POA: Diagnosis not present

## 2019-02-25 DIAGNOSIS — D631 Anemia in chronic kidney disease: Secondary | ICD-10-CM | POA: Diagnosis not present

## 2019-02-25 DIAGNOSIS — I1310 Hypertensive heart and chronic kidney disease without heart failure, with stage 1 through stage 4 chronic kidney disease, or unspecified chronic kidney disease: Secondary | ICD-10-CM | POA: Diagnosis not present

## 2019-02-25 DIAGNOSIS — H409 Unspecified glaucoma: Secondary | ICD-10-CM | POA: Diagnosis not present

## 2019-02-25 DIAGNOSIS — I471 Supraventricular tachycardia: Secondary | ICD-10-CM | POA: Diagnosis not present

## 2019-02-25 DIAGNOSIS — R569 Unspecified convulsions: Secondary | ICD-10-CM | POA: Diagnosis not present

## 2019-02-25 DIAGNOSIS — Z8781 Personal history of (healed) traumatic fracture: Secondary | ICD-10-CM | POA: Diagnosis not present

## 2019-02-25 DIAGNOSIS — R41841 Cognitive communication deficit: Secondary | ICD-10-CM | POA: Diagnosis not present

## 2019-02-25 DIAGNOSIS — I351 Nonrheumatic aortic (valve) insufficiency: Secondary | ICD-10-CM | POA: Diagnosis not present

## 2019-02-25 NOTE — Progress Notes (Signed)
Patient is scheduled for a virtual visit with Dr. Martinique on 05/21/2019 at 2 PM

## 2019-02-27 ENCOUNTER — Telehealth (INDEPENDENT_AMBULATORY_CARE_PROVIDER_SITE_OTHER): Payer: Medicare HMO | Admitting: Cardiology

## 2019-02-27 ENCOUNTER — Ambulatory Visit: Payer: Medicare HMO | Admitting: Cardiovascular Disease

## 2019-02-27 ENCOUNTER — Encounter: Payer: Self-pay | Admitting: Cardiology

## 2019-02-27 VITALS — BP 122/56 | HR 72 | Ht 64.0 in | Wt 127.0 lb

## 2019-02-27 DIAGNOSIS — R2681 Unsteadiness on feet: Secondary | ICD-10-CM

## 2019-02-27 DIAGNOSIS — I471 Supraventricular tachycardia, unspecified: Secondary | ICD-10-CM

## 2019-02-27 DIAGNOSIS — N1832 Chronic kidney disease, stage 3b: Secondary | ICD-10-CM

## 2019-02-27 DIAGNOSIS — Z8781 Personal history of (healed) traumatic fracture: Secondary | ICD-10-CM | POA: Diagnosis not present

## 2019-02-27 DIAGNOSIS — I351 Nonrheumatic aortic (valve) insufficiency: Secondary | ICD-10-CM | POA: Diagnosis not present

## 2019-02-27 DIAGNOSIS — D649 Anemia, unspecified: Secondary | ICD-10-CM

## 2019-02-27 DIAGNOSIS — D631 Anemia in chronic kidney disease: Secondary | ICD-10-CM | POA: Diagnosis not present

## 2019-02-27 DIAGNOSIS — R569 Unspecified convulsions: Secondary | ICD-10-CM | POA: Diagnosis not present

## 2019-02-27 DIAGNOSIS — I1310 Hypertensive heart and chronic kidney disease without heart failure, with stage 1 through stage 4 chronic kidney disease, or unspecified chronic kidney disease: Secondary | ICD-10-CM | POA: Diagnosis not present

## 2019-02-27 DIAGNOSIS — I10 Essential (primary) hypertension: Secondary | ICD-10-CM

## 2019-02-27 DIAGNOSIS — R41841 Cognitive communication deficit: Secondary | ICD-10-CM | POA: Diagnosis not present

## 2019-02-27 DIAGNOSIS — R404 Transient alteration of awareness: Secondary | ICD-10-CM

## 2019-02-27 DIAGNOSIS — R4189 Other symptoms and signs involving cognitive functions and awareness: Secondary | ICD-10-CM

## 2019-02-27 DIAGNOSIS — H409 Unspecified glaucoma: Secondary | ICD-10-CM | POA: Diagnosis not present

## 2019-02-27 NOTE — Progress Notes (Signed)
Virtual Visit via Telephone Note   This visit type was conducted due to national recommendations for restrictions regarding the COVID-19 Pandemic (e.g. social distancing) in an effort to limit this patient's exposure and mitigate transmission in our community.  Due to her co-morbid illnesses, this patient is at least at moderate risk for complications without adequate follow up.  This format is felt to be most appropriate for this patient at this time.  The patient did not have access to video technology/had technical difficulties with video requiring transitioning to audio format only (telephone).  All issues noted in this document were discussed and addressed.  No physical exam could be performed with this format.  Please refer to the patient's chart for her  consent to telehealth for [facility].   Date:  02/27/2019   ID:  Natalie Fitzgerald, DOB [birth year], MRN [id number]  Patient Location: Home Provider Location: Home  PCP:  Martinique, Betty G, MD  Cardiologist:  Quay Burow, MD  Electrophysiologist:  None   Evaluation Performed:  Follow-Up Visit  Chief Complaint:  none  History of Present Illness:    Natalie Fitzgerald is a 86 y.o. female with a hx of hypertension, mild aortic insufficiency, CKD stage III, unstable gait, and mild AR. She presented to [facility] on 01/23/2019 after an unresponsive episode, later found to be in SVT. She was also noted to be anemic with hemoglobin of 6.5 and was transfused, though in retrospect this have been a spurious result as f/u Hgb have been stable-8-10.  She was placed on Lopressor and hydralazine and has done well.  She was discharged to SNF and returned home 02/13/2019.  Her daughter cares for her at home.  The patient was contacted today for follow-up.  Her [name] gave the history, the patient was present.  Her daughter says since her mother's been home from rehab she has been doing well.  Her blood pressures controlled at home.  She is working  with physical therapy and is up using a walker which her daughter tells me is a huge improvement.  As far she can tell she has had no further episodes of tachycardia, presyncope, or syncope.  The patient does not have symptoms concerning for COVID-19 infection (fever, chills, cough, or new shortness of breath).    Past Medical History:  Diagnosis Date  . Aortic valve disorders    moderate aortic insufficiency  . Atypical mole    nose  . Dizziness   . Glaucoma   . Nipple problem    Past Surgical History:  Procedure Laterality Date  . APPENDECTOMY    . BACK SURGERY       Current Meds  Medication Sig  . amLODipine (NORVASC) 5 MG tablet Take 1 tablet (5 mg total) by mouth daily.  . Cholecalciferol (VITAMIN D-3) 125 MCG (5000 UT) TABS Take 5,000 Units by mouth daily with breakfast.  . hydrALAZINE (APRESOLINE) 10 MG tablet Take 1 tablet (10 mg total) by mouth every 8 (eight) hours.  . metoprolol tartrate (LOPRESSOR) 25 MG tablet Take 1 tablet (25 mg total) by mouth 2 (two) times daily.  . Multiple Vitamins-Minerals (ONE-A-DAY WOMENS 50 PLUS) TABS Take 1 tablet by mouth daily with breakfast.  . vitamin B-12 1000 MCG tablet Take 1 tablet (1,000 mcg total) by mouth daily with breakfast.     Allergies:   Patient has no known allergies.   Social History   Tobacco Use  . Smoking status: Never Smoker  . Smokeless  tobacco: Never Used  Substance Use Topics  . Alcohol use: No  . Drug use: No     Family Hx: The patient's family history includes Alcohol abuse in her father; Cancer in her mother.  ROS:   Please see the history of present illness.    All other systems reviewed and are negative.   Prior CV studies:   The following studies were reviewed today: Echo 01/13/2019  Labs/Other Tests and Data Reviewed:    EKG:  An ECG dated 01/30/2019 was personally reviewed today and demonstrated: narrow complex PSVT-160  Recent Labs: 01/23/2019: ALT 16; TSH 3.958 01/27/2019: BUN 17;  Creatinine, Ser 1.02; Hemoglobin 8.4; Platelets 315; Potassium 3.9; Sodium 137   Recent Lipid Panel Lab Results  Component Value Date/Time   CHOL 182 01/22/2014 10:16 AM   TRIG 126.0 01/22/2014 10:16 AM   HDL 47.20 01/22/2014 10:16 AM   CHOLHDL 4 01/22/2014 10:16 AM   LDLCALC 110 (H) 01/22/2014 10:16 AM   LDLDIRECT 130.2 12/23/2012 04:17 PM    Wt Readings from Last 3 Encounters:  02/27/19 127 lb (57.6 kg)  01/24/19 128 lb 15.5 oz (58.5 kg)  07/30/18 156 lb (70.8 kg)     Objective:    Vital Signs:  BP (!) 122/56   Pulse 72   Ht 5\' 4"  (1.626 m)   Wt 127 lb (57.6 kg)   BMI 21.80 kg/m    VITAL SIGNS:  reviewed  ASSESSMENT & PLAN:    Syncope and collapse- Admitted 01/22/2019-01/28/2019- then [facility] to SNF until 02/13/2019 Documented PSVT and anemia (Hgb 6.5) on admission.  PSVT- Controlled on beta blocker Rx  HTN- Doing well on Hydralazine 10 mg TID  Anemia- Hgb 6.5 on admission-? Lab error- will re check Hgb next week   AI- Mild aortic insufficiency, normal LVF by echo Jan 2021  Gait disorder- Getting home PT  Plan- Check CBC, BMP next week- if stable f/u with dr Gwenlyn Found in 3 months.   COVID-19 Education: The signs and symptoms of COVID-19 were discussed with the patient and how to seek care for testing (follow up with PCP or arrange E-visit).  The importance of social distancing was discussed today.  Time:   Today, I have spent 15 minutes with the patient with telehealth technology discussing the above problems.     Medication Adjustments/Labs and Tests Ordered: Current medicines are reviewed at length with the patient today.  Concerns regarding medicines are outlined above.   Tests Ordered: No orders of the defined types were placed in this encounter.   Medication Changes: No orders of the defined types were placed in this encounter.   Follow Up:  In Person 3 months with Dr Gwenlyn Found  Signed, Kerin Ransom, PA-C  02/27/2019 10:22 AM    [place]

## 2019-02-27 NOTE — Patient Instructions (Signed)
Medication Instructions:  Your physician recommends that you continue on your current medications as directed. Please refer to the Current Medication list given to you today.  *If you need a refill on your cardiac medications before your next appointment, please call your pharmacy*  Lab Work: NEXT WEEK (BMET, CBC)  If you have labs (blood work) drawn today and your tests are completely normal, you will receive your results only by: Marland Kitchen MyChart Message (if you have MyChart) OR . A paper copy in the mail If you have any lab test that is abnormal or we need to change your treatment, we will call you to review the results.  Testing/Procedures: NONE  Follow-Up: At [facility], you and your health needs are our priority.  As part of our continuing mission to provide you with exceptional heart care, we have created designated Provider Care Teams.  These Care Teams include your primary Cardiologist (physician) and Advanced Practice Providers (APPs -  Physician Assistants and Nurse Practitioners) who all work together to provide you with the care you need, when you need it.  Your next appointment:   3 month(s)  The format for your next appointment:   In Person  Provider:   Quay Burow, MD

## 2019-02-28 DIAGNOSIS — I471 Supraventricular tachycardia: Secondary | ICD-10-CM | POA: Diagnosis not present

## 2019-02-28 DIAGNOSIS — R41841 Cognitive communication deficit: Secondary | ICD-10-CM | POA: Diagnosis not present

## 2019-02-28 DIAGNOSIS — N1832 Chronic kidney disease, stage 3b: Secondary | ICD-10-CM | POA: Diagnosis not present

## 2019-02-28 DIAGNOSIS — I351 Nonrheumatic aortic (valve) insufficiency: Secondary | ICD-10-CM | POA: Diagnosis not present

## 2019-02-28 DIAGNOSIS — R569 Unspecified convulsions: Secondary | ICD-10-CM | POA: Diagnosis not present

## 2019-02-28 DIAGNOSIS — D631 Anemia in chronic kidney disease: Secondary | ICD-10-CM | POA: Diagnosis not present

## 2019-02-28 DIAGNOSIS — Z8781 Personal history of (healed) traumatic fracture: Secondary | ICD-10-CM | POA: Diagnosis not present

## 2019-02-28 DIAGNOSIS — H409 Unspecified glaucoma: Secondary | ICD-10-CM | POA: Diagnosis not present

## 2019-02-28 DIAGNOSIS — I1310 Hypertensive heart and chronic kidney disease without heart failure, with stage 1 through stage 4 chronic kidney disease, or unspecified chronic kidney disease: Secondary | ICD-10-CM | POA: Diagnosis not present

## 2019-03-03 NOTE — Telephone Encounter (Addendum)
Natalie Fitzgerald with [facility] is calling to see if all verbal orders was given for PT, OT and Education officer, museum.  Called was transferred to [facility].

## 2019-03-04 DIAGNOSIS — R569 Unspecified convulsions: Secondary | ICD-10-CM | POA: Diagnosis not present

## 2019-03-04 DIAGNOSIS — D631 Anemia in chronic kidney disease: Secondary | ICD-10-CM | POA: Diagnosis not present

## 2019-03-04 DIAGNOSIS — I471 Supraventricular tachycardia: Secondary | ICD-10-CM | POA: Diagnosis not present

## 2019-03-04 DIAGNOSIS — I1310 Hypertensive heart and chronic kidney disease without heart failure, with stage 1 through stage 4 chronic kidney disease, or unspecified chronic kidney disease: Secondary | ICD-10-CM | POA: Diagnosis not present

## 2019-03-04 DIAGNOSIS — I351 Nonrheumatic aortic (valve) insufficiency: Secondary | ICD-10-CM | POA: Diagnosis not present

## 2019-03-04 DIAGNOSIS — H409 Unspecified glaucoma: Secondary | ICD-10-CM | POA: Diagnosis not present

## 2019-03-04 DIAGNOSIS — R41841 Cognitive communication deficit: Secondary | ICD-10-CM | POA: Diagnosis not present

## 2019-03-04 DIAGNOSIS — N1832 Chronic kidney disease, stage 3b: Secondary | ICD-10-CM | POA: Diagnosis not present

## 2019-03-04 DIAGNOSIS — Z8781 Personal history of (healed) traumatic fracture: Secondary | ICD-10-CM | POA: Diagnosis not present

## 2019-03-05 DIAGNOSIS — D631 Anemia in chronic kidney disease: Secondary | ICD-10-CM | POA: Diagnosis not present

## 2019-03-05 DIAGNOSIS — H409 Unspecified glaucoma: Secondary | ICD-10-CM | POA: Diagnosis not present

## 2019-03-05 DIAGNOSIS — I1310 Hypertensive heart and chronic kidney disease without heart failure, with stage 1 through stage 4 chronic kidney disease, or unspecified chronic kidney disease: Secondary | ICD-10-CM | POA: Diagnosis not present

## 2019-03-05 DIAGNOSIS — Z8781 Personal history of (healed) traumatic fracture: Secondary | ICD-10-CM | POA: Diagnosis not present

## 2019-03-05 DIAGNOSIS — N1832 Chronic kidney disease, stage 3b: Secondary | ICD-10-CM | POA: Diagnosis not present

## 2019-03-05 DIAGNOSIS — I471 Supraventricular tachycardia: Secondary | ICD-10-CM | POA: Diagnosis not present

## 2019-03-05 DIAGNOSIS — R41841 Cognitive communication deficit: Secondary | ICD-10-CM | POA: Diagnosis not present

## 2019-03-05 DIAGNOSIS — I351 Nonrheumatic aortic (valve) insufficiency: Secondary | ICD-10-CM | POA: Diagnosis not present

## 2019-03-05 DIAGNOSIS — R569 Unspecified convulsions: Secondary | ICD-10-CM | POA: Diagnosis not present

## 2019-03-06 DIAGNOSIS — D631 Anemia in chronic kidney disease: Secondary | ICD-10-CM | POA: Diagnosis not present

## 2019-03-06 DIAGNOSIS — R41841 Cognitive communication deficit: Secondary | ICD-10-CM | POA: Diagnosis not present

## 2019-03-06 DIAGNOSIS — Z8781 Personal history of (healed) traumatic fracture: Secondary | ICD-10-CM | POA: Diagnosis not present

## 2019-03-06 DIAGNOSIS — I471 Supraventricular tachycardia: Secondary | ICD-10-CM | POA: Diagnosis not present

## 2019-03-06 DIAGNOSIS — N1832 Chronic kidney disease, stage 3b: Secondary | ICD-10-CM | POA: Diagnosis not present

## 2019-03-06 DIAGNOSIS — H409 Unspecified glaucoma: Secondary | ICD-10-CM | POA: Diagnosis not present

## 2019-03-06 DIAGNOSIS — I1310 Hypertensive heart and chronic kidney disease without heart failure, with stage 1 through stage 4 chronic kidney disease, or unspecified chronic kidney disease: Secondary | ICD-10-CM | POA: Diagnosis not present

## 2019-03-06 DIAGNOSIS — I351 Nonrheumatic aortic (valve) insufficiency: Secondary | ICD-10-CM | POA: Diagnosis not present

## 2019-03-06 DIAGNOSIS — R569 Unspecified convulsions: Secondary | ICD-10-CM | POA: Diagnosis not present

## 2019-03-11 ENCOUNTER — Other Ambulatory Visit: Payer: Self-pay

## 2019-03-11 ENCOUNTER — Other Ambulatory Visit: Payer: Medicare HMO | Admitting: Internal Medicine

## 2019-03-11 DIAGNOSIS — I351 Nonrheumatic aortic (valve) insufficiency: Secondary | ICD-10-CM | POA: Diagnosis not present

## 2019-03-11 DIAGNOSIS — N1832 Chronic kidney disease, stage 3b: Secondary | ICD-10-CM | POA: Diagnosis not present

## 2019-03-11 DIAGNOSIS — H409 Unspecified glaucoma: Secondary | ICD-10-CM | POA: Diagnosis not present

## 2019-03-11 DIAGNOSIS — Z8781 Personal history of (healed) traumatic fracture: Secondary | ICD-10-CM | POA: Diagnosis not present

## 2019-03-11 DIAGNOSIS — R41841 Cognitive communication deficit: Secondary | ICD-10-CM | POA: Diagnosis not present

## 2019-03-11 DIAGNOSIS — I471 Supraventricular tachycardia: Secondary | ICD-10-CM | POA: Diagnosis not present

## 2019-03-11 DIAGNOSIS — R569 Unspecified convulsions: Secondary | ICD-10-CM | POA: Diagnosis not present

## 2019-03-11 DIAGNOSIS — I1310 Hypertensive heart and chronic kidney disease without heart failure, with stage 1 through stage 4 chronic kidney disease, or unspecified chronic kidney disease: Secondary | ICD-10-CM | POA: Diagnosis not present

## 2019-03-11 DIAGNOSIS — D631 Anemia in chronic kidney disease: Secondary | ICD-10-CM | POA: Diagnosis not present

## 2019-03-12 ENCOUNTER — Other Ambulatory Visit: Payer: Self-pay

## 2019-03-12 DIAGNOSIS — I471 Supraventricular tachycardia: Secondary | ICD-10-CM | POA: Diagnosis not present

## 2019-03-12 DIAGNOSIS — R569 Unspecified convulsions: Secondary | ICD-10-CM | POA: Diagnosis not present

## 2019-03-12 DIAGNOSIS — H409 Unspecified glaucoma: Secondary | ICD-10-CM | POA: Diagnosis not present

## 2019-03-12 DIAGNOSIS — I1310 Hypertensive heart and chronic kidney disease without heart failure, with stage 1 through stage 4 chronic kidney disease, or unspecified chronic kidney disease: Secondary | ICD-10-CM | POA: Diagnosis not present

## 2019-03-12 DIAGNOSIS — R41841 Cognitive communication deficit: Secondary | ICD-10-CM | POA: Diagnosis not present

## 2019-03-12 DIAGNOSIS — D631 Anemia in chronic kidney disease: Secondary | ICD-10-CM | POA: Diagnosis not present

## 2019-03-12 DIAGNOSIS — I351 Nonrheumatic aortic (valve) insufficiency: Secondary | ICD-10-CM | POA: Diagnosis not present

## 2019-03-12 DIAGNOSIS — N1832 Chronic kidney disease, stage 3b: Secondary | ICD-10-CM | POA: Diagnosis not present

## 2019-03-12 DIAGNOSIS — Z8781 Personal history of (healed) traumatic fracture: Secondary | ICD-10-CM | POA: Diagnosis not present

## 2019-03-13 DIAGNOSIS — I471 Supraventricular tachycardia: Secondary | ICD-10-CM | POA: Diagnosis not present

## 2019-03-13 DIAGNOSIS — R569 Unspecified convulsions: Secondary | ICD-10-CM | POA: Diagnosis not present

## 2019-03-13 DIAGNOSIS — I1310 Hypertensive heart and chronic kidney disease without heart failure, with stage 1 through stage 4 chronic kidney disease, or unspecified chronic kidney disease: Secondary | ICD-10-CM | POA: Diagnosis not present

## 2019-03-13 DIAGNOSIS — R41841 Cognitive communication deficit: Secondary | ICD-10-CM | POA: Diagnosis not present

## 2019-03-13 DIAGNOSIS — N1832 Chronic kidney disease, stage 3b: Secondary | ICD-10-CM | POA: Diagnosis not present

## 2019-03-13 DIAGNOSIS — D631 Anemia in chronic kidney disease: Secondary | ICD-10-CM | POA: Diagnosis not present

## 2019-03-13 DIAGNOSIS — I351 Nonrheumatic aortic (valve) insufficiency: Secondary | ICD-10-CM | POA: Diagnosis not present

## 2019-03-13 DIAGNOSIS — H409 Unspecified glaucoma: Secondary | ICD-10-CM | POA: Diagnosis not present

## 2019-03-13 DIAGNOSIS — Z8781 Personal history of (healed) traumatic fracture: Secondary | ICD-10-CM | POA: Diagnosis not present

## 2019-03-18 DIAGNOSIS — R41841 Cognitive communication deficit: Secondary | ICD-10-CM | POA: Diagnosis not present

## 2019-03-18 DIAGNOSIS — I1310 Hypertensive heart and chronic kidney disease without heart failure, with stage 1 through stage 4 chronic kidney disease, or unspecified chronic kidney disease: Secondary | ICD-10-CM | POA: Diagnosis not present

## 2019-03-18 DIAGNOSIS — I471 Supraventricular tachycardia: Secondary | ICD-10-CM | POA: Diagnosis not present

## 2019-03-18 DIAGNOSIS — I351 Nonrheumatic aortic (valve) insufficiency: Secondary | ICD-10-CM | POA: Diagnosis not present

## 2019-03-18 DIAGNOSIS — R569 Unspecified convulsions: Secondary | ICD-10-CM | POA: Diagnosis not present

## 2019-03-18 DIAGNOSIS — H409 Unspecified glaucoma: Secondary | ICD-10-CM | POA: Diagnosis not present

## 2019-03-18 DIAGNOSIS — Z8781 Personal history of (healed) traumatic fracture: Secondary | ICD-10-CM | POA: Diagnosis not present

## 2019-03-18 DIAGNOSIS — D631 Anemia in chronic kidney disease: Secondary | ICD-10-CM | POA: Diagnosis not present

## 2019-03-18 DIAGNOSIS — N1832 Chronic kidney disease, stage 3b: Secondary | ICD-10-CM | POA: Diagnosis not present

## 2019-03-20 DIAGNOSIS — H409 Unspecified glaucoma: Secondary | ICD-10-CM | POA: Diagnosis not present

## 2019-03-20 DIAGNOSIS — R569 Unspecified convulsions: Secondary | ICD-10-CM | POA: Diagnosis not present

## 2019-03-20 DIAGNOSIS — I1310 Hypertensive heart and chronic kidney disease without heart failure, with stage 1 through stage 4 chronic kidney disease, or unspecified chronic kidney disease: Secondary | ICD-10-CM | POA: Diagnosis not present

## 2019-03-20 DIAGNOSIS — D631 Anemia in chronic kidney disease: Secondary | ICD-10-CM | POA: Diagnosis not present

## 2019-03-20 DIAGNOSIS — N1832 Chronic kidney disease, stage 3b: Secondary | ICD-10-CM | POA: Diagnosis not present

## 2019-03-20 DIAGNOSIS — I471 Supraventricular tachycardia: Secondary | ICD-10-CM | POA: Diagnosis not present

## 2019-03-20 DIAGNOSIS — I351 Nonrheumatic aortic (valve) insufficiency: Secondary | ICD-10-CM | POA: Diagnosis not present

## 2019-03-20 DIAGNOSIS — Z8781 Personal history of (healed) traumatic fracture: Secondary | ICD-10-CM | POA: Diagnosis not present

## 2019-03-20 DIAGNOSIS — R41841 Cognitive communication deficit: Secondary | ICD-10-CM | POA: Diagnosis not present

## 2019-03-25 DIAGNOSIS — Z8781 Personal history of (healed) traumatic fracture: Secondary | ICD-10-CM | POA: Diagnosis not present

## 2019-03-25 DIAGNOSIS — R41841 Cognitive communication deficit: Secondary | ICD-10-CM | POA: Diagnosis not present

## 2019-03-25 DIAGNOSIS — I471 Supraventricular tachycardia: Secondary | ICD-10-CM | POA: Diagnosis not present

## 2019-03-25 DIAGNOSIS — N1832 Chronic kidney disease, stage 3b: Secondary | ICD-10-CM | POA: Diagnosis not present

## 2019-03-25 DIAGNOSIS — I351 Nonrheumatic aortic (valve) insufficiency: Secondary | ICD-10-CM | POA: Diagnosis not present

## 2019-03-25 DIAGNOSIS — D631 Anemia in chronic kidney disease: Secondary | ICD-10-CM | POA: Diagnosis not present

## 2019-03-25 DIAGNOSIS — I1310 Hypertensive heart and chronic kidney disease without heart failure, with stage 1 through stage 4 chronic kidney disease, or unspecified chronic kidney disease: Secondary | ICD-10-CM | POA: Diagnosis not present

## 2019-03-25 DIAGNOSIS — R569 Unspecified convulsions: Secondary | ICD-10-CM | POA: Diagnosis not present

## 2019-03-25 DIAGNOSIS — H409 Unspecified glaucoma: Secondary | ICD-10-CM | POA: Diagnosis not present

## 2019-03-27 ENCOUNTER — Telehealth: Payer: Self-pay | Admitting: Family Medicine

## 2019-03-27 DIAGNOSIS — D631 Anemia in chronic kidney disease: Secondary | ICD-10-CM | POA: Diagnosis not present

## 2019-03-27 DIAGNOSIS — I471 Supraventricular tachycardia: Secondary | ICD-10-CM | POA: Diagnosis not present

## 2019-03-27 DIAGNOSIS — H409 Unspecified glaucoma: Secondary | ICD-10-CM | POA: Diagnosis not present

## 2019-03-27 DIAGNOSIS — I351 Nonrheumatic aortic (valve) insufficiency: Secondary | ICD-10-CM | POA: Diagnosis not present

## 2019-03-27 DIAGNOSIS — R569 Unspecified convulsions: Secondary | ICD-10-CM | POA: Diagnosis not present

## 2019-03-27 DIAGNOSIS — R41841 Cognitive communication deficit: Secondary | ICD-10-CM | POA: Diagnosis not present

## 2019-03-27 DIAGNOSIS — N1832 Chronic kidney disease, stage 3b: Secondary | ICD-10-CM | POA: Diagnosis not present

## 2019-03-27 DIAGNOSIS — I1310 Hypertensive heart and chronic kidney disease without heart failure, with stage 1 through stage 4 chronic kidney disease, or unspecified chronic kidney disease: Secondary | ICD-10-CM | POA: Diagnosis not present

## 2019-03-27 DIAGNOSIS — Z8781 Personal history of (healed) traumatic fracture: Secondary | ICD-10-CM | POA: Diagnosis not present

## 2019-03-27 NOTE — Telephone Encounter (Signed)
Cindee Salt with [facility] is calling in needing orders for PT for twice a week for 3 weeks.  He will accept verbal orders.

## 2019-03-28 NOTE — Telephone Encounter (Signed)
Okay for verbal 

## 2019-03-28 NOTE — Telephone Encounter (Signed)
I left Cindee Salt a voicemail to return my call.

## 2019-03-28 NOTE — Telephone Encounter (Signed)
Verbal given to Cindee Salt.

## 2019-03-28 NOTE — Telephone Encounter (Signed)
It is okay to give verbal authorization to requested services. Thanks, BJ 

## 2019-04-02 DIAGNOSIS — I1310 Hypertensive heart and chronic kidney disease without heart failure, with stage 1 through stage 4 chronic kidney disease, or unspecified chronic kidney disease: Secondary | ICD-10-CM | POA: Diagnosis not present

## 2019-04-02 DIAGNOSIS — I351 Nonrheumatic aortic (valve) insufficiency: Secondary | ICD-10-CM | POA: Diagnosis not present

## 2019-04-02 DIAGNOSIS — R569 Unspecified convulsions: Secondary | ICD-10-CM | POA: Diagnosis not present

## 2019-04-02 DIAGNOSIS — Z8781 Personal history of (healed) traumatic fracture: Secondary | ICD-10-CM | POA: Diagnosis not present

## 2019-04-02 DIAGNOSIS — I471 Supraventricular tachycardia: Secondary | ICD-10-CM | POA: Diagnosis not present

## 2019-04-02 DIAGNOSIS — N1832 Chronic kidney disease, stage 3b: Secondary | ICD-10-CM | POA: Diagnosis not present

## 2019-04-02 DIAGNOSIS — R41841 Cognitive communication deficit: Secondary | ICD-10-CM | POA: Diagnosis not present

## 2019-04-02 DIAGNOSIS — D631 Anemia in chronic kidney disease: Secondary | ICD-10-CM | POA: Diagnosis not present

## 2019-04-02 DIAGNOSIS — H409 Unspecified glaucoma: Secondary | ICD-10-CM | POA: Diagnosis not present

## 2019-04-03 DIAGNOSIS — N1832 Chronic kidney disease, stage 3b: Secondary | ICD-10-CM | POA: Diagnosis not present

## 2019-04-03 DIAGNOSIS — I351 Nonrheumatic aortic (valve) insufficiency: Secondary | ICD-10-CM | POA: Diagnosis not present

## 2019-04-03 DIAGNOSIS — R41841 Cognitive communication deficit: Secondary | ICD-10-CM | POA: Diagnosis not present

## 2019-04-03 DIAGNOSIS — R569 Unspecified convulsions: Secondary | ICD-10-CM | POA: Diagnosis not present

## 2019-04-03 DIAGNOSIS — I1310 Hypertensive heart and chronic kidney disease without heart failure, with stage 1 through stage 4 chronic kidney disease, or unspecified chronic kidney disease: Secondary | ICD-10-CM | POA: Diagnosis not present

## 2019-04-03 DIAGNOSIS — Z8781 Personal history of (healed) traumatic fracture: Secondary | ICD-10-CM | POA: Diagnosis not present

## 2019-04-03 DIAGNOSIS — H409 Unspecified glaucoma: Secondary | ICD-10-CM | POA: Diagnosis not present

## 2019-04-03 DIAGNOSIS — I471 Supraventricular tachycardia: Secondary | ICD-10-CM | POA: Diagnosis not present

## 2019-04-03 DIAGNOSIS — D631 Anemia in chronic kidney disease: Secondary | ICD-10-CM | POA: Diagnosis not present

## 2019-04-07 DIAGNOSIS — I471 Supraventricular tachycardia: Secondary | ICD-10-CM | POA: Diagnosis not present

## 2019-04-07 DIAGNOSIS — R569 Unspecified convulsions: Secondary | ICD-10-CM | POA: Diagnosis not present

## 2019-04-07 DIAGNOSIS — H409 Unspecified glaucoma: Secondary | ICD-10-CM | POA: Diagnosis not present

## 2019-04-07 DIAGNOSIS — D631 Anemia in chronic kidney disease: Secondary | ICD-10-CM | POA: Diagnosis not present

## 2019-04-07 DIAGNOSIS — Z8781 Personal history of (healed) traumatic fracture: Secondary | ICD-10-CM | POA: Diagnosis not present

## 2019-04-07 DIAGNOSIS — N1832 Chronic kidney disease, stage 3b: Secondary | ICD-10-CM | POA: Diagnosis not present

## 2019-04-07 DIAGNOSIS — R41841 Cognitive communication deficit: Secondary | ICD-10-CM | POA: Diagnosis not present

## 2019-04-07 DIAGNOSIS — I351 Nonrheumatic aortic (valve) insufficiency: Secondary | ICD-10-CM | POA: Diagnosis not present

## 2019-04-07 DIAGNOSIS — I1310 Hypertensive heart and chronic kidney disease without heart failure, with stage 1 through stage 4 chronic kidney disease, or unspecified chronic kidney disease: Secondary | ICD-10-CM | POA: Diagnosis not present

## 2019-04-08 DIAGNOSIS — R569 Unspecified convulsions: Secondary | ICD-10-CM | POA: Diagnosis not present

## 2019-04-08 DIAGNOSIS — H409 Unspecified glaucoma: Secondary | ICD-10-CM | POA: Diagnosis not present

## 2019-04-08 DIAGNOSIS — N1832 Chronic kidney disease, stage 3b: Secondary | ICD-10-CM | POA: Diagnosis not present

## 2019-04-08 DIAGNOSIS — R41841 Cognitive communication deficit: Secondary | ICD-10-CM | POA: Diagnosis not present

## 2019-04-08 DIAGNOSIS — Z8781 Personal history of (healed) traumatic fracture: Secondary | ICD-10-CM | POA: Diagnosis not present

## 2019-04-08 DIAGNOSIS — I471 Supraventricular tachycardia: Secondary | ICD-10-CM | POA: Diagnosis not present

## 2019-04-08 DIAGNOSIS — D631 Anemia in chronic kidney disease: Secondary | ICD-10-CM | POA: Diagnosis not present

## 2019-04-08 DIAGNOSIS — I351 Nonrheumatic aortic (valve) insufficiency: Secondary | ICD-10-CM | POA: Diagnosis not present

## 2019-04-08 DIAGNOSIS — I1310 Hypertensive heart and chronic kidney disease without heart failure, with stage 1 through stage 4 chronic kidney disease, or unspecified chronic kidney disease: Secondary | ICD-10-CM | POA: Diagnosis not present

## 2019-04-10 DIAGNOSIS — R569 Unspecified convulsions: Secondary | ICD-10-CM | POA: Diagnosis not present

## 2019-04-10 DIAGNOSIS — I351 Nonrheumatic aortic (valve) insufficiency: Secondary | ICD-10-CM | POA: Diagnosis not present

## 2019-04-10 DIAGNOSIS — I1310 Hypertensive heart and chronic kidney disease without heart failure, with stage 1 through stage 4 chronic kidney disease, or unspecified chronic kidney disease: Secondary | ICD-10-CM | POA: Diagnosis not present

## 2019-04-10 DIAGNOSIS — R41841 Cognitive communication deficit: Secondary | ICD-10-CM | POA: Diagnosis not present

## 2019-04-10 DIAGNOSIS — Z8781 Personal history of (healed) traumatic fracture: Secondary | ICD-10-CM | POA: Diagnosis not present

## 2019-04-10 DIAGNOSIS — D631 Anemia in chronic kidney disease: Secondary | ICD-10-CM | POA: Diagnosis not present

## 2019-04-10 DIAGNOSIS — H409 Unspecified glaucoma: Secondary | ICD-10-CM | POA: Diagnosis not present

## 2019-04-10 DIAGNOSIS — I471 Supraventricular tachycardia: Secondary | ICD-10-CM | POA: Diagnosis not present

## 2019-04-10 DIAGNOSIS — N1832 Chronic kidney disease, stage 3b: Secondary | ICD-10-CM | POA: Diagnosis not present

## 2019-04-15 ENCOUNTER — Other Ambulatory Visit: Payer: Self-pay

## 2019-04-15 ENCOUNTER — Other Ambulatory Visit: Payer: Medicare HMO | Admitting: Internal Medicine

## 2019-04-15 DIAGNOSIS — R41841 Cognitive communication deficit: Secondary | ICD-10-CM | POA: Diagnosis not present

## 2019-04-15 DIAGNOSIS — N1832 Chronic kidney disease, stage 3b: Secondary | ICD-10-CM | POA: Diagnosis not present

## 2019-04-15 DIAGNOSIS — Z8781 Personal history of (healed) traumatic fracture: Secondary | ICD-10-CM | POA: Diagnosis not present

## 2019-04-15 DIAGNOSIS — H409 Unspecified glaucoma: Secondary | ICD-10-CM | POA: Diagnosis not present

## 2019-04-15 DIAGNOSIS — I471 Supraventricular tachycardia: Secondary | ICD-10-CM | POA: Diagnosis not present

## 2019-04-15 DIAGNOSIS — I351 Nonrheumatic aortic (valve) insufficiency: Secondary | ICD-10-CM | POA: Diagnosis not present

## 2019-04-15 DIAGNOSIS — R569 Unspecified convulsions: Secondary | ICD-10-CM | POA: Diagnosis not present

## 2019-04-15 DIAGNOSIS — I1310 Hypertensive heart and chronic kidney disease without heart failure, with stage 1 through stage 4 chronic kidney disease, or unspecified chronic kidney disease: Secondary | ICD-10-CM | POA: Diagnosis not present

## 2019-04-15 DIAGNOSIS — D631 Anemia in chronic kidney disease: Secondary | ICD-10-CM | POA: Diagnosis not present

## 2019-04-16 DIAGNOSIS — I1310 Hypertensive heart and chronic kidney disease without heart failure, with stage 1 through stage 4 chronic kidney disease, or unspecified chronic kidney disease: Secondary | ICD-10-CM | POA: Diagnosis not present

## 2019-04-16 DIAGNOSIS — N1832 Chronic kidney disease, stage 3b: Secondary | ICD-10-CM | POA: Diagnosis not present

## 2019-04-16 DIAGNOSIS — I471 Supraventricular tachycardia: Secondary | ICD-10-CM | POA: Diagnosis not present

## 2019-04-16 DIAGNOSIS — D631 Anemia in chronic kidney disease: Secondary | ICD-10-CM | POA: Diagnosis not present

## 2019-04-16 DIAGNOSIS — R569 Unspecified convulsions: Secondary | ICD-10-CM | POA: Diagnosis not present

## 2019-04-16 DIAGNOSIS — R41841 Cognitive communication deficit: Secondary | ICD-10-CM | POA: Diagnosis not present

## 2019-04-16 DIAGNOSIS — Z8781 Personal history of (healed) traumatic fracture: Secondary | ICD-10-CM | POA: Diagnosis not present

## 2019-04-16 DIAGNOSIS — I351 Nonrheumatic aortic (valve) insufficiency: Secondary | ICD-10-CM | POA: Diagnosis not present

## 2019-04-16 DIAGNOSIS — H409 Unspecified glaucoma: Secondary | ICD-10-CM | POA: Diagnosis not present

## 2019-04-17 DIAGNOSIS — Z8781 Personal history of (healed) traumatic fracture: Secondary | ICD-10-CM | POA: Diagnosis not present

## 2019-04-17 DIAGNOSIS — I471 Supraventricular tachycardia: Secondary | ICD-10-CM | POA: Diagnosis not present

## 2019-04-17 DIAGNOSIS — I1310 Hypertensive heart and chronic kidney disease without heart failure, with stage 1 through stage 4 chronic kidney disease, or unspecified chronic kidney disease: Secondary | ICD-10-CM | POA: Diagnosis not present

## 2019-04-17 DIAGNOSIS — D631 Anemia in chronic kidney disease: Secondary | ICD-10-CM | POA: Diagnosis not present

## 2019-04-17 DIAGNOSIS — I351 Nonrheumatic aortic (valve) insufficiency: Secondary | ICD-10-CM | POA: Diagnosis not present

## 2019-04-17 DIAGNOSIS — R41841 Cognitive communication deficit: Secondary | ICD-10-CM | POA: Diagnosis not present

## 2019-04-17 DIAGNOSIS — R569 Unspecified convulsions: Secondary | ICD-10-CM | POA: Diagnosis not present

## 2019-04-17 DIAGNOSIS — H409 Unspecified glaucoma: Secondary | ICD-10-CM | POA: Diagnosis not present

## 2019-04-17 DIAGNOSIS — N1832 Chronic kidney disease, stage 3b: Secondary | ICD-10-CM | POA: Diagnosis not present

## 2019-05-07 ENCOUNTER — Encounter: Payer: Self-pay | Admitting: Family Medicine

## 2019-05-07 ENCOUNTER — Telehealth (INDEPENDENT_AMBULATORY_CARE_PROVIDER_SITE_OTHER): Payer: Medicare HMO | Admitting: Family Medicine

## 2019-05-07 VITALS — Ht 64.0 in

## 2019-05-07 DIAGNOSIS — M545 Low back pain, unspecified: Secondary | ICD-10-CM

## 2019-05-07 DIAGNOSIS — M62830 Muscle spasm of back: Secondary | ICD-10-CM | POA: Diagnosis not present

## 2019-05-07 MED ORDER — TIZANIDINE HCL 4 MG PO TABS: 2.0000 mg | ORAL_TABLET | Freq: Three times a day (TID) | ORAL | 0 refills | Status: AC | PRN

## 2019-05-07 NOTE — Progress Notes (Addendum)
Virtual Visit via Video Note   I connected with Natalie Fitzgerald and her daughter on 05/07/19 by a video enabled telemedicine application and verified that I am speaking with the correct person using two identifiers.  Location patient: home Location provider:work office Persons participating in the virtual visit: patient, provider  I discussed the limitations of evaluation and management by telemedicine and the availability of in person appointments. The patient expressed understanding and agreed to proceed.  Chief Complaint  Patient presents with  . Back Pain    mid back pain, started 2 days ago, staying in bed most of the day   HPI: Natalie Fitzgerald is a 86 year old female with history of hypertension,unstable gait, aortic valvular disease, and hyperlipidemia complaining about 2 days of moderate to severe lower back pain. She has hx lower back pain but this is different, she describes like "cramps/spasms." Problem is "continuous", not radiated to lower extremities. She denies numbness, tingling, or focal weakness. No saddle anesthesia or changes in bowel/bladder dysfunction. Pain is exacerbated by certain movements and alleviated by rest.  There is no history of trauma but according to her daughter, prior to pain onset she was sitting in a "unusual position" for long periods of time. Her daughter has massage area and applied topical medication, which has helped.  Negative for fever, chills, changes in appetite, abdominal pain, N/V, changes in bowel habits, urinary symptoms, or skin rash. + Decreased activity, has been in bed since pain onset. Problem is stable.  ROS: See pertinent positives and negatives per HPI.  Past Medical History:  Diagnosis Date  . Aortic valve disorders    moderate aortic insufficiency  . Atypical mole    nose  . Dizziness   . Glaucoma   . Nipple problem     Past Surgical History:  Procedure Laterality Date  . APPENDECTOMY    . BACK SURGERY     Family  History  Problem Relation Age of Onset  . Cancer Mother        kidney  . Alcohol abuse Father    Social History   Socioeconomic History  . Marital status: Widowed    Spouse name: Not on file  . Number of children: Not on file  . Years of education: Not on file  . Highest education level: Not on file  Occupational History  . Not on file  Tobacco Use  . Smoking status: Never Smoker  . Smokeless tobacco: Never Used  Substance and Sexual Activity  . Alcohol use: No  . Drug use: No  . Sexual activity: Not on file  Other Topics Concern  . Not on file  Social History Narrative  . Not on file   Social Determinants of Health   Financial Resource Strain:   . Difficulty of Paying Living Expenses:   Food Insecurity:   . Worried About Charity fundraiser in the Last Year:   . Arboriculturist in the Last Year:   Transportation Needs:   . Film/video editor (Medical):   Marland Kitchen Lack of Transportation (Non-Medical):   Physical Activity:   . Days of Exercise per Week:   . Minutes of Exercise per Session:   Stress:   . Feeling of Stress :   Social Connections:   . Frequency of Communication with Friends and Family:   . Frequency of Social Gatherings with Friends and Family:   . Attends Religious Services:   . Active Member of Clubs or Organizations:   . Attends  Club or Organization Meetings:   Marland Kitchen Marital Status:   Intimate Partner Violence:   . Fear of Current or Ex-Partner:   . Emotionally Abused:   Marland Kitchen Physically Abused:   . Sexually Abused:     Current Outpatient Medications:  .  amLODipine (NORVASC) 5 MG tablet, Take 1 tablet (5 mg total) by mouth daily., Disp: 30 tablet, Rfl: 3 .  Cholecalciferol (VITAMIN D-3) 125 MCG (5000 UT) TABS, Take 5,000 Units by mouth daily with breakfast., Disp: , Rfl:  .  hydrALAZINE (APRESOLINE) 10 MG tablet, Take 1 tablet (10 mg total) by mouth every 8 (eight) hours., Disp: 90 tablet, Rfl: 3 .  metoprolol tartrate (LOPRESSOR) 25 MG tablet, Take  1 tablet (25 mg total) by mouth 2 (two) times daily., Disp: 60 tablet, Rfl: 3 .  Multiple Vitamins-Minerals (ONE-A-DAY WOMENS 50 PLUS) TABS, Take 1 tablet by mouth daily with breakfast., Disp: , Rfl:  .  vitamin B-12 1000 MCG tablet, Take 1 tablet (1,000 mcg total) by mouth daily with breakfast., Disp:  , Rfl:  .  tiZANidine (ZANAFLEX) 4 MG tablet, Take 0.5-1 tablets (2-4 mg total) by mouth every 8 (eight) hours as needed for up to 20 days for muscle spasms., Disp: 30 tablet, Rfl: 0  EXAM:  VITALS per patient if applicable:Ht 5\' 4"  (1.626 m)   BMI 21.80 kg/m   GENERAL: alert, oriented, appears well and in no acute distress.She is in bed, seems comfortable.  HEENT: atraumatic, normocephalic,conjunctiva clear, no obvious abnormalities on inspection.  LUNGS: on inspection no signs of respiratory distress, breathing rate appears normal, no obvious gross SOB, gasping or wheezing  CV: no obvious cyanosis  Natalie: Mild pain with movement,when trying to get up from bed to evaluate back. No skin lesions or rash appreciated.  PSYCH/NEURO: pleasant and cooperative, no obvious depression or anxiety.  ASSESSMENT AND PLAN:  Discussed the following assessment and plan:  Back muscle spasm - Plan: tiZANidine (ZANAFLEX) 4 MG tablet Reporting this problem as new. We discussed possible etiologies. Reviewing notes, she has had "cramps" in LE's. Adequate hydration. For now I do not think further work-up is needed but if problem is persistent or gets worse, we will need to arrange lab appt (BMP,Mg). Local heat/ice and massage. Continue topical treatment.  Bilateral low back pain without sciatica, unspecified chronicity After discussion of some side effects, she would like to try muscle relaxant: Tizanidine 4 mg, recommend starting with 1/2 tablet can increase it to the whole tablet if well-tolerated, max 3 times per day.  She understands this type of medication can increase the risk for falls. Recommend  avoiding prolonged sitting,standing,lying down. She needs to move around the house as tolerated, relative rest.   I discussed the assessment and treatment plan with the patient. Natalie Fitzgerald and her daughter were provided an opportunity to ask questions and all were answered. The patient agreed with the plan and demonstrated an understanding of the instructions.   The patient was advised to call back or seek an in-person evaluation if the symptoms worsen or if the condition fails to improve as anticipated.  Return if symptoms worsen or fail to improve.    Sir Mallis Martinique, MD

## 2019-05-19 ENCOUNTER — Other Ambulatory Visit: Payer: Self-pay | Admitting: Family Medicine

## 2019-05-21 ENCOUNTER — Other Ambulatory Visit: Payer: Self-pay | Admitting: Family Medicine

## 2019-05-21 ENCOUNTER — Encounter: Payer: Self-pay | Admitting: Family Medicine

## 2019-05-21 ENCOUNTER — Telehealth (INDEPENDENT_AMBULATORY_CARE_PROVIDER_SITE_OTHER): Payer: Medicare HMO | Admitting: Family Medicine

## 2019-05-21 VITALS — BP 130/66 | HR 67

## 2019-05-21 DIAGNOSIS — G301 Alzheimer's disease with late onset: Secondary | ICD-10-CM | POA: Diagnosis not present

## 2019-05-21 DIAGNOSIS — F0280 Dementia in other diseases classified elsewhere without behavioral disturbance: Secondary | ICD-10-CM

## 2019-05-21 DIAGNOSIS — I10 Essential (primary) hypertension: Secondary | ICD-10-CM

## 2019-05-21 MED ORDER — AMLODIPINE BESYLATE 5 MG PO TABS: ORAL_TABLET | ORAL | 2 refills | Status: DC

## 2019-05-21 MED ORDER — DONEPEZIL HCL 5 MG PO TABS: 5.0000 mg | ORAL_TABLET | Freq: Every day | ORAL | 1 refills | Status: DC

## 2019-05-21 NOTE — Progress Notes (Signed)
Virtual Visit via Video Note  I connected with Natalie Fitzgerald on 05/21/19 by a video enabled telemedicine application and verified that I am speaking with the correct person using two identifiers.  Location patient: home Location provider:work office Persons participating in the virtual visit: patient, provider  I discussed the limitations of evaluation and management by telemedicine and the availability of in person appointments. The patient expressed understanding and agreed to proceed.   HPI: Natalie Fitzgerald is a 86 yo female who is being seen today for chronic disease management. Her daughter is with her. She was last seen on 05/07/2019 for acute visit, back pain. Zanaflex was recommended. Back pain has resolved.  HTN:Not checking BP regularly, when checked in the past it has been "good."  Currently she is on amlodipine 5 mg daily, hydralazine 10 mg 3 times daily, and metoprolol tartrate 25 mg twice daily. Negative for severe/frequent headache, visual changes, chest pain, dyspnea, palpitation, or edema.  Lab Results  Component Value Date   CREATININE 1.02 (H) 01/27/2019   BUN 17 01/27/2019   NA 137 01/27/2019   K 3.9 01/27/2019   CL 106 01/27/2019   CO2 21 (L) 01/27/2019   It has also been a concern about gradual onset of memory difficulties for a few years,it seems to be getting worse. Natalie Fitzgerald acknowledges problem. Negative for Natalie changes. She has not noted fever,chills,changes in appetite,abnormal wt loss,or focal deficit.  Lab Results  Component Value Date   VITAMINB12 268 01/23/2019   Lab Results  Component Value Date   TSH 3.958 01/23/2019    MMSE - Mini Mental State Exam 05/21/2019 09/17/2015  Not completed: - (No Data)  Orientation to time 2 -  Orientation to Place 2 -  Registration 1 -  Attention/ Calculation 4 -  Attention/Calculation-comments Spelled WORLD backwards, missed R. Able to count from 100 by 5's until 80.Could not do it by 7's. -  Recall 1 -   Language- name 2 objects 2 -  Language- repeat 1 -  Language- follow 3 step command 1 -  Language- read & follow direction 1 -  Write a sentence 0 -  Copy design 1 -  Total score 16 -    ROS: See pertinent positives and negatives per HPI.  Past Medical History:  Diagnosis Date  . Aortic valve disorders    moderate aortic insufficiency  . Atypical mole    nose  . Dizziness   . Glaucoma   . Nipple problem     Past Surgical History:  Procedure Laterality Date  . APPENDECTOMY    . BACK SURGERY      Family History  Problem Relation Age of Onset  . Cancer Mother        kidney  . Alcohol abuse Father     Social History   Socioeconomic History  . Marital status: Widowed    Spouse name: Not on file  . Number of children: Not on file  . Years of education: Not on file  . Highest education level: Not on file  Occupational History  . Not on file  Tobacco Use  . Smoking status: Never Smoker  . Smokeless tobacco: Never Used  Substance and Sexual Activity  . Alcohol use: No  . Drug use: No  . Sexual activity: Not on file  Other Topics Concern  . Not on file  Social History Narrative  . Not on file   Social Determinants of Health   Financial Resource Strain:   .  Difficulty of Paying Living Expenses:   Food Insecurity:   . Worried About Charity fundraiser in the Last Year:   . Arboriculturist in the Last Year:   Transportation Needs:   . Film/video editor (Medical):   Marland Kitchen Lack of Transportation (Non-Medical):   Physical Activity:   . Days of Exercise per Week:   . Minutes of Exercise per Session:   Stress:   . Feeling of Stress :   Social Connections:   . Frequency of Communication with Friends and Family:   . Frequency of Social Gatherings with Friends and Family:   . Attends Religious Services:   . Active Member of Clubs or Organizations:   . Attends Archivist Meetings:   Marland Kitchen Marital Status:   Intimate Partner Violence:   . Fear of  Current or Ex-Partner:   . Emotionally Abused:   Marland Kitchen Physically Abused:   . Sexually Abused:     Current Outpatient Medications:  .  amLODipine (NORVASC) 5 MG tablet, TAKE 1 TABLET(5 MG) BY MOUTH DAILY, Disp: 90 tablet, Rfl: 2 .  Cholecalciferol (VITAMIN D-3) 125 MCG (5000 UT) TABS, Take 5,000 Units by mouth daily with breakfast., Disp: , Rfl:  .  hydrALAZINE (APRESOLINE) 10 MG tablet, TAKE 1 TABLET(10 MG) BY MOUTH EVERY 8 HOURS, Disp: 90 tablet, Rfl: 3 .  metoprolol tartrate (LOPRESSOR) 25 MG tablet, TAKE 1 TABLET(25 MG) BY MOUTH TWICE DAILY, Disp: 60 tablet, Rfl: 3 .  Multiple Vitamins-Minerals (ONE-A-DAY WOMENS 50 PLUS) TABS, Take 1 tablet by mouth daily with breakfast., Disp: , Rfl:  .  tiZANidine (ZANAFLEX) 4 MG tablet, Take 0.5-1 tablets (2-4 mg total) by mouth every 8 (eight) hours as needed for up to 20 days for muscle spasms., Disp: 30 tablet, Rfl: 0 .  vitamin B-12 1000 MCG tablet, Take 1 tablet (1,000 mcg total) by mouth daily with breakfast., Disp:  , Rfl:  .  donepezil (ARICEPT) 5 MG tablet, Take 1 tablet (5 mg total) by mouth at bedtime., Disp: 30 tablet, Rfl: 1  EXAM:  VITALS per patient if applicable:BP [id number]   Pulse 67   GENERAL: alert, oriented, appears well and in no acute distress  HEENT: atraumatic, conjunttiva clear, no obvious abnormalities on inspection.  NECK: normal movements of the head and neck  LUNGS: on inspection no signs of respiratory distress, breathing rate appears normal, no obvious gross SOB, gasping or wheezing  CV: no obvious cyanosis  Natalie: moves all visible extremities without noticeable abnormality  PSYCH/NEURO: pleasant and cooperative, no obvious depression or anxiety, speech grossly intact. Oriented in person and place. She does not remember day,month. When asked about season she says winter first then Spring. She know who is the president of [place].Abnormal clock test.  Drawing    Clock test: She was supposed to place hands at 11: 10.      ASSESSMENT AND PLAN:  Discussed the following assessment and plan:  Late onset Alzheimer's disease without behavioral disturbance ([place]) We discussed other possible causes of cognitive disorders. Hx and MMSE score suggest dementia. We discussed dx,prognosis,and treatment options. Cognitive challenging games recommended. After discussing some side effects,she agrees with trying Aricept 5 mg daily.In 5 weeks if well tolerated dose can be increased to 10 mg.  Essential hypertension BP adequately controlled. No changes in current management. Continue low salt diet.  35 min video visit.   I discussed the assessment and treatment plan with the patient. Natalie Fitzgerald and her daughter provided  an opportunity to ask questions and all were answered. They agreed with the plan and demonstrated an understanding of the instructions.    Return in about 3 months (around 08/21/2019) for Dementia.   Shakeda Pearse [place], MD

## 2019-05-23 ENCOUNTER — Telehealth: Payer: Self-pay | Admitting: Cardiovascular Disease

## 2019-05-23 NOTE — Telephone Encounter (Signed)
Noted - Placed in appt note daughter will accompany pt to 05/28/2019 appointment with Dr Gwenlyn Found as she is wheelchair bound.

## 2019-05-23 NOTE — Telephone Encounter (Signed)
  Daughter [name] will need to come with patient to her appt with Dr Gwenlyn Found on 05/28/19 since the patient is wheelchair bound.

## 2019-05-27 ENCOUNTER — Other Ambulatory Visit: Payer: Medicare HMO | Admitting: Internal Medicine

## 2019-05-27 DIAGNOSIS — G301 Alzheimer's disease with late onset: Secondary | ICD-10-CM | POA: Diagnosis not present

## 2019-05-27 DIAGNOSIS — F0280 Dementia in other diseases classified elsewhere without behavioral disturbance: Secondary | ICD-10-CM | POA: Diagnosis not present

## 2019-05-27 DIAGNOSIS — Z515 Encounter for palliative care: Secondary | ICD-10-CM | POA: Diagnosis not present

## 2019-05-28 ENCOUNTER — Telehealth: Payer: Self-pay | Admitting: Family Medicine

## 2019-05-28 ENCOUNTER — Ambulatory Visit: Payer: Medicare HMO | Admitting: Cardiovascular Disease

## 2019-05-28 ENCOUNTER — Other Ambulatory Visit: Payer: Self-pay

## 2019-05-28 ENCOUNTER — Encounter: Payer: Self-pay | Admitting: Cardiovascular Disease

## 2019-05-28 DIAGNOSIS — I10 Essential (primary) hypertension: Secondary | ICD-10-CM

## 2019-05-28 DIAGNOSIS — I471 Supraventricular tachycardia: Secondary | ICD-10-CM | POA: Diagnosis not present

## 2019-05-28 DIAGNOSIS — E782 Mixed hyperlipidemia: Secondary | ICD-10-CM | POA: Diagnosis not present

## 2019-05-28 NOTE — Progress Notes (Signed)
05/28/2019 [facility]   1927/11/25  [id number]  Primary Physician Martinique, Betty G, MD Primary Cardiologist: Lorretta Harp MD Lupe Carney, [place]  HPI:  Natalie Fitzgerald is a 86 y.o.  moderately overweight widowed Caucasian female mother of 4 children, grandmother of 5 grandchildren self-referred to our [place] office because of convenience and parking issues at the [employer]. Her prior cardiologist was Dr. Cathie Olden.  She is accompanied by one of her [name] who she lives with.  I last spoke to her on the phone for a telemedicine virtual phone visit 07/30/2018.  She has a history of questionable hypertension as well as mild aortic insufficiency by 2-D echo as recently as 01/21/14 with normal LV size and function. She tells me that she was put on low-dose amlodipine because of her aortic insufficiency for "afterload reduction". She has never had a heart attack or stroke. She denies chest pain or shortness of breath.  Since I saw her in the office a year ago she is remained stable.  Her daughter, who she lives with, is present during the interview.  She denies chest pain or shortness of breath.  Blood pressures been under good control.   Current Meds  Medication Sig  . amLODipine (NORVASC) 5 MG tablet TAKE 1 TABLET(5 MG) BY MOUTH DAILY  . Cholecalciferol (VITAMIN D-3) 125 MCG (5000 UT) TABS Take 5,000 Units by mouth daily with breakfast.  . donepezil (ARICEPT) 5 MG tablet TAKE 1 TABLET(5 MG) BY MOUTH AT BEDTIME  . hydrALAZINE (APRESOLINE) 10 MG tablet TAKE 1 TABLET(10 MG) BY MOUTH EVERY 8 HOURS  . metoprolol tartrate (LOPRESSOR) 25 MG tablet TAKE 1 TABLET(25 MG) BY MOUTH TWICE DAILY  . Multiple Vitamins-Minerals (ONE-A-DAY WOMENS 50 PLUS) TABS Take 1 tablet by mouth daily with breakfast.  . vitamin B-12 1000 MCG tablet Take 1 tablet (1,000 mcg total) by mouth daily with breakfast.     No Known Allergies  Social History   Socioeconomic History  . Marital  status: Widowed    Spouse name: Not on file  . Number of children: Not on file  . Years of education: Not on file  . Highest education level: Not on file  Occupational History  . Not on file  Tobacco Use  . Smoking status: Never Smoker  . Smokeless tobacco: Never Used  Substance and Sexual Activity  . Alcohol use: No  . Drug use: No  . Sexual activity: Not on file  Other Topics Concern  . Not on file  Social History Narrative  . Not on file   Social Determinants of Health   Financial Resource Strain:   . Difficulty of Paying Living Expenses:   Food Insecurity:   . Worried About Charity fundraiser in the Last Year:   . Arboriculturist in the Last Year:   Transportation Needs:   . Film/video editor (Medical):   Marland Kitchen Lack of Transportation (Non-Medical):   Physical Activity:   . Days of Exercise per Week:   . Minutes of Exercise per Session:   Stress:   . Feeling of Stress :   Social Connections:   . Frequency of Communication with Friends and Family:   . Frequency of Social Gatherings with Friends and Family:   . Attends Religious Services:   . Active Member of Clubs or Organizations:   . Attends Archivist Meetings:   Marland Kitchen Marital Status:   Intimate Partner Violence:   .  Fear of Current or Ex-Partner:   . Emotionally Abused:   Marland Kitchen Physically Abused:   . Sexually Abused:      Review of Systems: General: negative for chills, fever, night sweats or weight changes.  Cardiovascular: negative for chest pain, dyspnea on exertion, edema, orthopnea, palpitations, paroxysmal nocturnal dyspnea or shortness of breath Dermatological: negative for rash Respiratory: negative for cough or wheezing Urologic: negative for hematuria Abdominal: negative for nausea, vomiting, diarrhea, bright red blood per rectum, melena, or hematemesis Neurologic: negative for visual changes, syncope, or dizziness All other systems reviewed and are otherwise negative except as noted  above.    Blood pressure 126/62, pulse 79, height 5\' 4"  (1.626 m), weight 120 lb 6.4 oz (54.6 kg), SpO2 96 %.  General appearance: alert and no distress Neck: no adenopathy, no JVD, supple, symmetrical, trachea midline, thyroid not enlarged, symmetric, no tenderness/mass/nodules and Soft left carotid bruit Lungs: clear to auscultation bilaterally Heart: regular rate and rhythm, S1, S2 normal, no murmur, click, rub or gallop Extremities: extremities normal, atraumatic, no cyanosis or edema Pulses: 2+ and symmetric Skin: Skin color, texture, turgor normal. No rashes or lesions Neurologic: Alert and oriented X 3, normal strength and tone. Normal symmetric reflexes. Normal coordination and gait  EKG not performed today  ASSESSMENT AND PLAN:   Hyperlipemia History of hyperlipidemia not on statin therapy followed by her PCP  Hypertension History of essential hypertension a blood pressure measured today 126/62.  She is on amlodipine, metoprolol and hydralazine.  SVT (supraventricular tachycardia) ([place]) History of SVT in the past without recurrence on metoprolol      Lorretta Harp MD [facility] 05/28/2019 2:35 PM

## 2019-05-28 NOTE — Patient Instructions (Signed)
Medication Instructions:  The current medical regimen is effective;  continue present plan and medications.  *If you need a refill on your cardiac medications before your next appointment, please call your pharmacy*  Follow-Up: At [facility], you and your health needs are our priority.  As part of our continuing mission to provide you with exceptional heart care, we have created designated Provider Care Teams.  These Care Teams include your primary Cardiologist (physician) and Advanced Practice Providers (APPs -  Physician Assistants and Nurse Practitioners) who all work together to provide you with the care you need, when you need it.  We recommend signing up for the patient portal called "MyChart".  Sign up information is provided on this After Visit Summary.  MyChart is used to connect with patients for Virtual Visits (Telemedicine).  Patients are able to view lab/test results, encounter notes, upcoming appointments, etc.  Non-urgent messages can be sent to your provider as well.   To learn more about what you can do with MyChart, go to [email or web address].    Your next appointment:   Follow up in 6 months with Kerin Ransom, PA-C Follow up in 12 months with Quay Burow, MD

## 2019-05-28 NOTE — Assessment & Plan Note (Signed)
History of hyperlipidemia not on statin therapy followed by her PCP 

## 2019-05-28 NOTE — Telephone Encounter (Signed)
Please advise 

## 2019-05-28 NOTE — Telephone Encounter (Signed)
Pts daughter is calling stating that the pt needs a referral to podiatrist for a sore ingrown toenail on L Big toe.  Pts daughter would like to have a call back to let her know if the referral went through.

## 2019-05-28 NOTE — Assessment & Plan Note (Signed)
History of essential hypertension a blood pressure measured today 126/62.  She is on amlodipine, metoprolol and hydralazine.

## 2019-05-28 NOTE — Assessment & Plan Note (Signed)
History of SVT in the past without recurrence on metoprolol

## 2019-05-30 ENCOUNTER — Other Ambulatory Visit: Payer: Self-pay | Admitting: *Deleted

## 2019-05-30 DIAGNOSIS — L600 Ingrowing nail: Secondary | ICD-10-CM

## 2019-05-30 NOTE — Telephone Encounter (Signed)
Referral placed and daughter,  [name] informed.

## 2019-05-30 NOTE — Telephone Encounter (Signed)
Usually podiatrist referral is not needed for most insurance. If she does need a referral, this can be placed. Thanks, BJ

## 2019-06-09 NOTE — Progress Notes (Signed)
    [facility] Consult Note Telephone: [phone]  Fax: [phone]  PATIENT NAME: Natalie Fitzgerald DOB: August 06, 1927 MRN: [id number]  PRIMARY CARE PROVIDER:   Martinique, Betty G, MD  REFERRING PROVIDER:  Martin[place], Betty G, MD [address] [place],  [place] [address]  RESPONSIBLE PARTYMertie Clause Daughter [phone]  [phone]    RECOMMENDATIONS and PLAN:  Palliative care encounter  Z51.5  1.  Advance care planning:  Goals reviewed with daughter with desire to avoid rehospitalization.  Daughter plans on continuing to provide care in the home setting.  DNR and MOST forms were previously completed and in the home.  Limited additional treatments and no feeding tube. Palliative care will follow-up in aprox 5-6 weeks.   2.  Vascular dementia:  Recurrent function decline since last visit.  FAST stage 6c, Continue to attempt mobilization at least to wheelchair daily.  Continue Aricept daily.  3.  Fatigue:  Multifactorial.  Consider evaluation of CBC and chemistries in light of hx of anemia and CKD.  Improve sleep hygiene. Hydration and adequate nutrition.  This could also represent advancement of vascular dementia. Monitor.  I spent 40 minutes providing this consultation,  from 1330 to 1410. More than 50% of the time in this consultation was spent coordinating communication with patient and daughter.    HISTORY OF PRESENT ILLNESS: Follow-up with  [name] .  Daughter reports that patient has reverted to spending many hours in the bed. She sleeps more during daytime hours and may be awake during the night.  She requires assistance with all ADLs and transferring to the chair.  No reports of rectal bleeding, vomiting or falls.  Palliative Care was asked to help address goals of care.   CODE STATUS: DNR  PPS: 40% transfers to wheelchair HOSPICE ELIGIBILITY/DIAGNOSIS: TBD  PAST MEDICAL HISTORY:  Past Medical History:  Diagnosis  Date  . Aortic valve disorders    moderate aortic insufficiency  . Atypical mole    nose  . Dizziness   . Glaucoma   . Nipple problem     SOCIAL HX: Lives with daughter  PERTINENT MEDICATIONS:  Outpatient Encounter Medications as of 05/27/2019  Medication Sig  . amLODipine (NORVASC) 5 MG tablet TAKE 1 TABLET(5 MG) BY MOUTH DAILY  . Cholecalciferol (VITAMIN D-3) 125 MCG (5000 UT) TABS Take 5,000 Units by mouth daily with breakfast.  . donepezil (ARICEPT) 5 MG tablet TAKE 1 TABLET(5 MG) BY MOUTH AT BEDTIME  . hydrALAZINE (APRESOLINE) 10 MG tablet TAKE 1 TABLET(10 MG) BY MOUTH EVERY 8 HOURS  . metoprolol tartrate (LOPRESSOR) 25 MG tablet TAKE 1 TABLET(25 MG) BY MOUTH TWICE DAILY  . Multiple Vitamins-Minerals (ONE-A-DAY WOMENS 50 PLUS) TABS Take 1 tablet by mouth daily with breakfast.  . [EXPIRED] tiZANidine (ZANAFLEX) 4 MG tablet Take 0.5-1 tablets (2-4 mg total) by mouth every 8 (eight) hours as needed for up to 20 days for muscle spasms.  . vitamin B-12 1000 MCG tablet Take 1 tablet (1,000 mcg total) by mouth daily with breakfast.   No facility-administered encounter medications on file as of 05/27/2019.    PHYSICAL EXAM:   General: NAD,chronically ill and frail appearing elderly female in bed Cardiovascular: regular rate and rhythm Pulmonary: clear ant fields Abdomen: soft, nontender, + bowel sounds Extremities: no edema Skin: no rashes Neurological: A&Ox2, generalized weakness, nods intermittently  Gonzella Lex, NP-C

## 2019-06-16 ENCOUNTER — Ambulatory Visit: Payer: Self-pay | Admitting: Podiatry

## 2019-07-15 ENCOUNTER — Other Ambulatory Visit: Payer: Medicare HMO | Admitting: Internal Medicine

## 2019-07-15 DIAGNOSIS — F0150 Vascular dementia without behavioral disturbance: Secondary | ICD-10-CM | POA: Diagnosis not present

## 2019-07-15 DIAGNOSIS — Z515 Encounter for palliative care: Secondary | ICD-10-CM | POA: Diagnosis not present

## 2019-07-16 NOTE — Progress Notes (Signed)
    [facility] Consult Note Telephone: [phone]  Fax: [phone]  PATIENT NAME: Natalie Fitzgerald DOB: [birth year] MRN: [id number]  PRIMARY CARE PROVIDER:   Martinique, Betty G, MD  REFERRING PROVIDER:  [place], Betty G, MD [address] [place],  [place] [address]  RESPONSIBLE PARTYMertie Clause Daughter [phone]  [phone]    RECOMMENDATIONS and PLAN:  Palliative care encounter  Z51.5  1.  Advance care planning:  Goals reviewed and remain unchanged.Marland Kitchen  She would like to avoid rehospitalization. DNR and MOST forms were previously completed and in the home.  Limited additional treatments and no feeding tube. Palliative care will follow-up in aprox 8 weeks.   2.  Vascular dementia:    FAST stage 6c with difficulty ambulating, Continue to attempt mobilization, transfers. Fall prevention risks reviewed. Continue Aricept daily.  3.  Fatigue: Unchanged and chronic. May be related to advancing dementia. Brief trips out of doors with supervision.  Hydration and adequate nutrition.  Monitor.  I spent 30 minutes providing this consultation,  from 1230 to 1300. More than 50% of the time in this consultation was spent coordinating communication with patient and daughter.    HISTORY OF PRESENT ILLNESS: Follow-up with  Natalie Fitzgerald .  Daughter reports increased weakness and 2 recent falls without noted injuries. She sleeps increased during daytime hours and night.  She requires assistance with all ADLs and transferring to the chair/bsc.  Palliative Care was asked to help address goals of care.   CODE STATUS: DNR  PPS: 40% transfers to wheelchair HOSPICE ELIGIBILITY/DIAGNOSIS: TBD  PAST MEDICAL HISTORY:  Past Medical History:  Diagnosis Date  . Aortic valve disorders    moderate aortic insufficiency  . Atypical mole    nose  . Dizziness   . Glaucoma   . Nipple problem     SOCIAL HX: Lives with  daughter/caregiver  PERTINENT MEDICATIONS:  Outpatient Encounter Medications as of 05/27/2019  Medication Sig  . amLODipine (NORVASC) 5 MG tablet TAKE 1 TABLET(5 MG) BY MOUTH DAILY  . Cholecalciferol (VITAMIN D-3) 125 MCG (5000 UT) TABS Take 5,000 Units by mouth daily with breakfast.  . donepezil (ARICEPT) 5 MG tablet TAKE 1 TABLET(5 MG) BY MOUTH AT BEDTIME  . hydrALAZINE (APRESOLINE) 10 MG tablet TAKE 1 TABLET(10 MG) BY MOUTH EVERY 8 HOURS  . metoprolol tartrate (LOPRESSOR) 25 MG tablet TAKE 1 TABLET(25 MG) BY MOUTH TWICE DAILY  . Multiple Vitamins-Minerals (ONE-A-DAY WOMENS 50 PLUS) TABS Take 1 tablet by mouth daily with breakfast.  . [EXPIRED] tiZANidine (ZANAFLEX) 4 MG tablet Take 0.5-1 tablets (2-4 mg total) by mouth every 8 (eight) hours as needed for up to 20 days for muscle spasms.  . vitamin B-12 1000 MCG tablet Take 1 tablet (1,000 mcg total) by mouth daily with breakfast.   No facility-administered encounter medications on file as of 05/27/2019.    PHYSICAL EXAM:   General: NAD,chronically ill and frail appearing elderly female in bed Cardiovascular: regular rate and rhythm Pulmonary: clear ant fields Abdomen: soft, nontender, + bowel sounds Extremities: no edema Skin: exposed skin is intact Neurological: A&Ox2, generalized weakness, requires assistance to stand  Gonzella Lex, NP-C

## 2019-08-17 ENCOUNTER — Other Ambulatory Visit: Payer: Self-pay | Admitting: Family Medicine

## 2019-08-17 DIAGNOSIS — I10 Essential (primary) hypertension: Secondary | ICD-10-CM

## 2019-08-22 ENCOUNTER — Telehealth: Payer: Medicare HMO | Admitting: Family Medicine

## 2019-08-25 ENCOUNTER — Encounter: Payer: Self-pay | Admitting: Family Medicine

## 2019-08-25 ENCOUNTER — Telehealth (INDEPENDENT_AMBULATORY_CARE_PROVIDER_SITE_OTHER): Payer: Medicare HMO | Admitting: Family Medicine

## 2019-08-25 VITALS — BP 142/82 | HR 60 | Ht 64.0 in

## 2019-08-25 DIAGNOSIS — K056 Periodontal disease, unspecified: Secondary | ICD-10-CM | POA: Diagnosis not present

## 2019-08-25 DIAGNOSIS — L282 Other prurigo: Secondary | ICD-10-CM

## 2019-08-25 DIAGNOSIS — I10 Essential (primary) hypertension: Secondary | ICD-10-CM

## 2019-08-25 DIAGNOSIS — G301 Alzheimer's disease with late onset: Secondary | ICD-10-CM

## 2019-08-25 DIAGNOSIS — F0280 Dementia in other diseases classified elsewhere without behavioral disturbance: Secondary | ICD-10-CM

## 2019-08-25 DIAGNOSIS — R2681 Unsteadiness on feet: Secondary | ICD-10-CM | POA: Diagnosis not present

## 2019-08-25 MED ORDER — AMLODIPINE BESYLATE 5 MG PO TABS: 7.5000 mg | ORAL_TABLET | Freq: Every day | ORAL | 2 refills | Status: DC

## 2019-08-25 MED ORDER — DONEPEZIL HCL 5 MG PO TABS: ORAL_TABLET | ORAL | 2 refills | Status: DC

## 2019-08-25 MED ORDER — CLOTRIMAZOLE-BETAMETHASONE 1-0.05 % EX CREA: 1.0000 "application " | TOPICAL_CREAM | Freq: Two times a day (BID) | CUTANEOUS | 1 refills | Status: DC

## 2019-08-25 NOTE — Assessment & Plan Note (Addendum)
Fall precautions. PT may help, so it will be arranged through [facility]. Help with ADL's can also be provided in the meantime.

## 2019-08-25 NOTE — Assessment & Plan Note (Addendum)
Daughter is reporting improvement. She does not think Aricept dose needs to be increased, so continue 5 mg daily. We discussed prognosis.

## 2019-08-25 NOTE — Progress Notes (Signed)
Virtual Visit via Video Note   I connected with [name] on 08/25/19 by a video enabled telemedicine application and verified that I am speaking with the correct person using two identifiers.  Location patient: home Location provider:work office Persons participating in the virtual visit: patient,daughter,and provider  I discussed the limitations of evaluation and management by telemedicine and the availability of in person appointments. The patient expressed understanding and agreed to proceed.  HPI: [name] is a 86 yo female with Hx of HTN,back pain,AI,and CKD III who is being seen today to follow on some of ehr chronic problems. Her daughter is her caregiver and she provides hx.  She was last seen on May 21, 2019.  Dementia:Last visit Aricept 5 mg was started. Her daughter has noticed positive changes since medication was started. She seems to be more "alert" throughout the day, enjoys watching TV, and having less episodes of confusion. Negative for side effects.  Hypertension: Her BP has been mildly elevated for the past few days. 153/77, and 54/64, 159/64, and 52/65, 120s/58, 130s/62, 131/61, 150/68. Negative for headaches, CP, dyspnea, palpitations, focal deficit, or edema. Currently she is on amlodipine 5 mg daily, metoprolol tartrate 25 mg twice daily, and hydralazine 10 mg 3 times daily. She has not complained of headache,CP,SOB,palpitations, and she has not had edema.   For the past week her appetite has decreased.  She has some periodontal disease, she has refused to see the dentist. According to daughter, teeth have fallen while she is eating. She does not brush her teeth, her daughter tries to help with this but it is hard for her to do so and has not been consistent.  She has not had pain or any discomfort.  Pruritic rash noted a month ago on her back, "tiny" 2 "flaky" lesions. Problem has improved.  She had whitish,scaly thick plaques on scalp, noted a few  days ago. Not tender. She has treated with mild shampoo and OTC A&D oint. . For a while she has had intermittent finely scaly areas around nose and behind ears. Negative for fever, chills, ear drainage, or sore throat.  Her daughter tries to help with her ADL's,it is becoming harder to do so. She does not have help, her sister visited recently and helped her with a shower.  Generalized OA and back pain, unstable gait that has gotten worse. PT has helped in the past.  ROS: See pertinent positives and negatives per HPI.  Past Medical History:  Diagnosis Date  . Aortic valve disorders    moderate aortic insufficiency  . Atypical mole    nose  . Dizziness   . Glaucoma   . Nipple problem     Past Surgical History:  Procedure Laterality Date  . APPENDECTOMY    . BACK SURGERY      Family History  Problem Relation Age of Onset  . Cancer Mother        kidney  . Alcohol abuse Father     Social History   Socioeconomic History  . Marital status: Widowed    Spouse name: Not on file  . Number of children: Not on file  . Years of education: Not on file  . Highest education level: Not on file  Occupational History  . Not on file  Tobacco Use  . Smoking status: Never Smoker  . Smokeless tobacco: Never Used  Substance and Sexual Activity  . Alcohol use: No  . Drug use: No  . Sexual activity: Not on  file  Other Topics Concern  . Not on file  Social History Narrative  . Not on file   Social Determinants of Health   Financial Resource Strain:   . Difficulty of Paying Living Expenses: Not on file  Food Insecurity:   . Worried About Charity fundraiser in the Last Year: Not on file  . Ran Out of Food in the Last Year: Not on file  Transportation Needs:   . Lack of Transportation (Medical): Not on file  . Lack of Transportation (Non-Medical): Not on file  Physical Activity:   . Days of Exercise per Week: Not on file  . Minutes of Exercise per Session: Not on file   Stress:   . Feeling of Stress : Not on file  Social Connections:   . Frequency of Communication with Friends and Family: Not on file  . Frequency of Social Gatherings with Friends and Family: Not on file  . Attends Religious Services: Not on file  . Active Member of Clubs or Organizations: Not on file  . Attends Archivist Meetings: Not on file  . Marital Status: Not on file  Intimate Partner Violence:   . Fear of Current or Ex-Partner: Not on file  . Emotionally Abused: Not on file  . Physically Abused: Not on file  . Sexually Abused: Not on file    Current Outpatient Medications:  .  amLODipine (NORVASC) 5 MG tablet, Take 1.5 tablets (7.5 mg total) by mouth at bedtime. TAKE 1 TABLET(5 MG) BY MOUTH DAILY, Disp: 90 tablet, Rfl: 2 .  Cholecalciferol (VITAMIN D-3) 125 MCG (5000 UT) TABS, Take 5,000 Units by mouth daily with breakfast., Disp: , Rfl:  .  donepezil (ARICEPT) 5 MG tablet, TAKE 1 TABLET(5 MG) BY MOUTH AT BEDTIME, Disp: 90 tablet, Rfl: 2 .  hydrALAZINE (APRESOLINE) 10 MG tablet, TAKE 1 TABLET(10 MG) BY MOUTH EVERY 8 HOURS, Disp: 90 tablet, Rfl: 3 .  metoprolol tartrate (LOPRESSOR) 25 MG tablet, TAKE 1 TABLET(25 MG) BY MOUTH TWICE DAILY, Disp: 60 tablet, Rfl: 3 .  Multiple Vitamins-Minerals (ONE-A-DAY WOMENS 50 PLUS) TABS, Take 1 tablet by mouth daily with breakfast., Disp: , Rfl:  .  vitamin B-12 1000 MCG tablet, Take 1 tablet (1,000 mcg total) by mouth daily with breakfast., Disp:  , Rfl:  .  clotrimazole-betamethasone (LOTRISONE) cream, Apply 1 application topically 2 (two) times daily., Disp: 30 g, Rfl: 1  EXAM:  VITALS per patient if applicable:BP (!) 671/24   Pulse 60   Ht 5\' 4"  (1.626 m)   BMI 20.67 kg/m   GENERAL: alert, oriented, appears well and in no acute distress  HEENT: atraumatic, normocephalic,conjunctiva clear, no obvious abnormalities on inspection of external nose and ears  LUNGS: on inspection no signs of respiratory distress, breathing  rate appears normal, no obvious gross SOB, gasping or wheezing  CV: no obvious cyanosis  SKIN: I cannot appreciate skin rash on back.  PSYCH/NEURO: Alert, pleasant and cooperative, no obvious depression or anxiety. Disoriented in time.  ASSESSMENT AND PLAN:  Discussed the following assessment and plan:  Pruritic rash  History suggests seborrheic dermatitis. We discussed dx,treatment options,and prognosis. Avoid oint medications. Lotrisone will help.  - Plan: clotrimazole-betamethasone (LOTRISONE) cream  Periodontal disease We discussed possible complications. Transportation is difficult because she needs a lot of help. [name] does not want to see a dentist.  Hypertension Mildly elevated. Possible complications of elevated BP discussed. Daughter agrees with increasing dose of Amlodipine from 5 mg to  7.5 mg (she has 2.5 mg left from old Rx). No changes in rest of antihypertensive meds. Continue monitoring BP and low salt diet.  Unstable gait Fall precautions. PT may help, so it will be arranged through [facility]. Help with ADL's can also be provided in the meantime.  Late onset Alzheimer's disease without behavioral disturbance [facility]) Daughter is reporting improvement. She does not think Aricept dose needs to be increased, so continue 5 mg daily. We discussed prognosis.  I discussed the assessment and treatment plan with the patient. [name] and her daughter were provided an opportunity to ask questions and all were answered. They agreed with the plan and demonstrated an understanding of the instructions.  Return in about 3 months (around 11/25/2019) for HTN.   Treena Cosman Martinique, MD

## 2019-08-25 NOTE — Assessment & Plan Note (Addendum)
Mildly elevated. Possible complications of elevated BP discussed. Daughter agrees with increasing dose of Amlodipine from 5 mg to 7.5 mg (she has 2.5 mg left from old Rx). No changes in rest of antihypertensive meds. Continue monitoring BP and low salt diet.

## 2019-09-01 DIAGNOSIS — N183 Chronic kidney disease, stage 3 unspecified: Secondary | ICD-10-CM | POA: Diagnosis not present

## 2019-09-01 DIAGNOSIS — M159 Polyosteoarthritis, unspecified: Secondary | ICD-10-CM | POA: Diagnosis not present

## 2019-09-01 DIAGNOSIS — L299 Pruritus, unspecified: Secondary | ICD-10-CM | POA: Diagnosis not present

## 2019-09-01 DIAGNOSIS — I10 Essential (primary) hypertension: Secondary | ICD-10-CM | POA: Diagnosis not present

## 2019-09-01 DIAGNOSIS — I351 Nonrheumatic aortic (valve) insufficiency: Secondary | ICD-10-CM | POA: Diagnosis not present

## 2019-09-01 DIAGNOSIS — F0280 Dementia in other diseases classified elsewhere without behavioral disturbance: Secondary | ICD-10-CM | POA: Diagnosis not present

## 2019-09-01 DIAGNOSIS — G301 Alzheimer's disease with late onset: Secondary | ICD-10-CM | POA: Diagnosis not present

## 2019-09-01 DIAGNOSIS — I129 Hypertensive chronic kidney disease with stage 1 through stage 4 chronic kidney disease, or unspecified chronic kidney disease: Secondary | ICD-10-CM | POA: Diagnosis not present

## 2019-09-01 DIAGNOSIS — H409 Unspecified glaucoma: Secondary | ICD-10-CM | POA: Diagnosis not present

## 2019-09-01 DIAGNOSIS — M549 Dorsalgia, unspecified: Secondary | ICD-10-CM | POA: Diagnosis not present

## 2019-09-02 DIAGNOSIS — M549 Dorsalgia, unspecified: Secondary | ICD-10-CM | POA: Diagnosis not present

## 2019-09-02 DIAGNOSIS — F0280 Dementia in other diseases classified elsewhere without behavioral disturbance: Secondary | ICD-10-CM | POA: Diagnosis not present

## 2019-09-02 DIAGNOSIS — H409 Unspecified glaucoma: Secondary | ICD-10-CM | POA: Diagnosis not present

## 2019-09-02 DIAGNOSIS — I351 Nonrheumatic aortic (valve) insufficiency: Secondary | ICD-10-CM | POA: Diagnosis not present

## 2019-09-02 DIAGNOSIS — L299 Pruritus, unspecified: Secondary | ICD-10-CM | POA: Diagnosis not present

## 2019-09-02 DIAGNOSIS — G301 Alzheimer's disease with late onset: Secondary | ICD-10-CM | POA: Diagnosis not present

## 2019-09-02 DIAGNOSIS — M159 Polyosteoarthritis, unspecified: Secondary | ICD-10-CM | POA: Diagnosis not present

## 2019-09-02 DIAGNOSIS — I129 Hypertensive chronic kidney disease with stage 1 through stage 4 chronic kidney disease, or unspecified chronic kidney disease: Secondary | ICD-10-CM | POA: Diagnosis not present

## 2019-09-02 DIAGNOSIS — N183 Chronic kidney disease, stage 3 unspecified: Secondary | ICD-10-CM | POA: Diagnosis not present

## 2019-09-04 ENCOUNTER — Telehealth: Payer: Self-pay | Admitting: Family Medicine

## 2019-09-04 DIAGNOSIS — M159 Polyosteoarthritis, unspecified: Secondary | ICD-10-CM | POA: Diagnosis not present

## 2019-09-04 DIAGNOSIS — G301 Alzheimer's disease with late onset: Secondary | ICD-10-CM | POA: Diagnosis not present

## 2019-09-04 DIAGNOSIS — F0280 Dementia in other diseases classified elsewhere without behavioral disturbance: Secondary | ICD-10-CM | POA: Diagnosis not present

## 2019-09-04 DIAGNOSIS — N183 Chronic kidney disease, stage 3 unspecified: Secondary | ICD-10-CM | POA: Diagnosis not present

## 2019-09-04 DIAGNOSIS — L299 Pruritus, unspecified: Secondary | ICD-10-CM | POA: Diagnosis not present

## 2019-09-04 DIAGNOSIS — I129 Hypertensive chronic kidney disease with stage 1 through stage 4 chronic kidney disease, or unspecified chronic kidney disease: Secondary | ICD-10-CM | POA: Diagnosis not present

## 2019-09-04 DIAGNOSIS — I351 Nonrheumatic aortic (valve) insufficiency: Secondary | ICD-10-CM | POA: Diagnosis not present

## 2019-09-04 DIAGNOSIS — H409 Unspecified glaucoma: Secondary | ICD-10-CM | POA: Diagnosis not present

## 2019-09-04 DIAGNOSIS — M549 Dorsalgia, unspecified: Secondary | ICD-10-CM | POA: Diagnosis not present

## 2019-09-04 NOTE — Telephone Encounter (Signed)
Natalie Fitzgerald from [place]  Needs verbal orders for PT and Social Worker  9 weeks  Twice a week for 3 weeks Once a week for 2 weeks Twice a week for 2 weeks Once a week for 2 weeks   Please advise

## 2019-09-05 DIAGNOSIS — M549 Dorsalgia, unspecified: Secondary | ICD-10-CM | POA: Diagnosis not present

## 2019-09-05 DIAGNOSIS — F0280 Dementia in other diseases classified elsewhere without behavioral disturbance: Secondary | ICD-10-CM | POA: Diagnosis not present

## 2019-09-05 DIAGNOSIS — I129 Hypertensive chronic kidney disease with stage 1 through stage 4 chronic kidney disease, or unspecified chronic kidney disease: Secondary | ICD-10-CM | POA: Diagnosis not present

## 2019-09-05 DIAGNOSIS — M159 Polyosteoarthritis, unspecified: Secondary | ICD-10-CM | POA: Diagnosis not present

## 2019-09-05 DIAGNOSIS — L299 Pruritus, unspecified: Secondary | ICD-10-CM | POA: Diagnosis not present

## 2019-09-05 DIAGNOSIS — I351 Nonrheumatic aortic (valve) insufficiency: Secondary | ICD-10-CM | POA: Diagnosis not present

## 2019-09-05 DIAGNOSIS — N183 Chronic kidney disease, stage 3 unspecified: Secondary | ICD-10-CM | POA: Diagnosis not present

## 2019-09-05 DIAGNOSIS — G301 Alzheimer's disease with late onset: Secondary | ICD-10-CM | POA: Diagnosis not present

## 2019-09-05 DIAGNOSIS — H409 Unspecified glaucoma: Secondary | ICD-10-CM | POA: Diagnosis not present

## 2019-09-05 NOTE — Telephone Encounter (Signed)
I left Natalie Fitzgerald a voicemail letting him know orders have been approved.

## 2019-09-05 NOTE — Telephone Encounter (Signed)
Okay for verbal 

## 2019-09-05 NOTE — Telephone Encounter (Signed)
It is okay to give verbal authorization to requested services. Thanks, BJ 

## 2019-09-08 DIAGNOSIS — H409 Unspecified glaucoma: Secondary | ICD-10-CM | POA: Diagnosis not present

## 2019-09-08 DIAGNOSIS — I129 Hypertensive chronic kidney disease with stage 1 through stage 4 chronic kidney disease, or unspecified chronic kidney disease: Secondary | ICD-10-CM | POA: Diagnosis not present

## 2019-09-08 DIAGNOSIS — L299 Pruritus, unspecified: Secondary | ICD-10-CM | POA: Diagnosis not present

## 2019-09-08 DIAGNOSIS — M159 Polyosteoarthritis, unspecified: Secondary | ICD-10-CM | POA: Diagnosis not present

## 2019-09-08 DIAGNOSIS — G301 Alzheimer's disease with late onset: Secondary | ICD-10-CM | POA: Diagnosis not present

## 2019-09-08 DIAGNOSIS — I351 Nonrheumatic aortic (valve) insufficiency: Secondary | ICD-10-CM | POA: Diagnosis not present

## 2019-09-08 DIAGNOSIS — M549 Dorsalgia, unspecified: Secondary | ICD-10-CM | POA: Diagnosis not present

## 2019-09-08 DIAGNOSIS — F0280 Dementia in other diseases classified elsewhere without behavioral disturbance: Secondary | ICD-10-CM | POA: Diagnosis not present

## 2019-09-08 DIAGNOSIS — N183 Chronic kidney disease, stage 3 unspecified: Secondary | ICD-10-CM | POA: Diagnosis not present

## 2019-09-11 ENCOUNTER — Telehealth: Payer: Self-pay | Admitting: Family Medicine

## 2019-09-11 DIAGNOSIS — I351 Nonrheumatic aortic (valve) insufficiency: Secondary | ICD-10-CM | POA: Diagnosis not present

## 2019-09-11 DIAGNOSIS — G301 Alzheimer's disease with late onset: Secondary | ICD-10-CM | POA: Diagnosis not present

## 2019-09-11 DIAGNOSIS — M549 Dorsalgia, unspecified: Secondary | ICD-10-CM | POA: Diagnosis not present

## 2019-09-11 DIAGNOSIS — N183 Chronic kidney disease, stage 3 unspecified: Secondary | ICD-10-CM | POA: Diagnosis not present

## 2019-09-11 DIAGNOSIS — M159 Polyosteoarthritis, unspecified: Secondary | ICD-10-CM | POA: Diagnosis not present

## 2019-09-11 DIAGNOSIS — H409 Unspecified glaucoma: Secondary | ICD-10-CM | POA: Diagnosis not present

## 2019-09-11 DIAGNOSIS — I129 Hypertensive chronic kidney disease with stage 1 through stage 4 chronic kidney disease, or unspecified chronic kidney disease: Secondary | ICD-10-CM | POA: Diagnosis not present

## 2019-09-11 DIAGNOSIS — F0280 Dementia in other diseases classified elsewhere without behavioral disturbance: Secondary | ICD-10-CM | POA: Diagnosis not present

## 2019-09-11 DIAGNOSIS — L299 Pruritus, unspecified: Secondary | ICD-10-CM | POA: Diagnosis not present

## 2019-09-11 NOTE — Telephone Encounter (Signed)
The patient had a TIA episode today to where she was not able to hold her drink in her mouth like she was drooling and she was not able to talk to her daughter and she thought she was in the living room when she was in her bedroom.  The EMS came out and checked her out. By the time they got there she was back to normal.  Langley Gauss the RN gave the daughter some signs and symptoms to look for to take the patient to the hospital.   Langley Gauss checked her vitals and everything is normal except her heart rate is 56.  Langley Gauss thinks it will be good for the patient to get an appointment to follow up with Dr. Martinique after this episode.  Please advise

## 2019-09-12 DIAGNOSIS — N183 Chronic kidney disease, stage 3 unspecified: Secondary | ICD-10-CM | POA: Diagnosis not present

## 2019-09-12 DIAGNOSIS — M549 Dorsalgia, unspecified: Secondary | ICD-10-CM | POA: Diagnosis not present

## 2019-09-12 DIAGNOSIS — I129 Hypertensive chronic kidney disease with stage 1 through stage 4 chronic kidney disease, or unspecified chronic kidney disease: Secondary | ICD-10-CM | POA: Diagnosis not present

## 2019-09-12 DIAGNOSIS — H409 Unspecified glaucoma: Secondary | ICD-10-CM | POA: Diagnosis not present

## 2019-09-12 DIAGNOSIS — I351 Nonrheumatic aortic (valve) insufficiency: Secondary | ICD-10-CM | POA: Diagnosis not present

## 2019-09-12 DIAGNOSIS — G301 Alzheimer's disease with late onset: Secondary | ICD-10-CM | POA: Diagnosis not present

## 2019-09-12 DIAGNOSIS — L299 Pruritus, unspecified: Secondary | ICD-10-CM | POA: Diagnosis not present

## 2019-09-12 DIAGNOSIS — F0280 Dementia in other diseases classified elsewhere without behavioral disturbance: Secondary | ICD-10-CM | POA: Diagnosis not present

## 2019-09-12 DIAGNOSIS — M159 Polyosteoarthritis, unspecified: Secondary | ICD-10-CM | POA: Diagnosis not present

## 2019-09-12 NOTE — Telephone Encounter (Signed)
Can you get them scheduled for a follow up visit? It can be in office or virtual. We still have some openings for next week.

## 2019-09-16 ENCOUNTER — Other Ambulatory Visit: Payer: Self-pay | Admitting: Family Medicine

## 2019-09-17 DIAGNOSIS — G301 Alzheimer's disease with late onset: Secondary | ICD-10-CM | POA: Diagnosis not present

## 2019-09-17 DIAGNOSIS — H409 Unspecified glaucoma: Secondary | ICD-10-CM | POA: Diagnosis not present

## 2019-09-17 DIAGNOSIS — L299 Pruritus, unspecified: Secondary | ICD-10-CM | POA: Diagnosis not present

## 2019-09-17 DIAGNOSIS — I351 Nonrheumatic aortic (valve) insufficiency: Secondary | ICD-10-CM | POA: Diagnosis not present

## 2019-09-17 DIAGNOSIS — M549 Dorsalgia, unspecified: Secondary | ICD-10-CM | POA: Diagnosis not present

## 2019-09-17 DIAGNOSIS — F0280 Dementia in other diseases classified elsewhere without behavioral disturbance: Secondary | ICD-10-CM | POA: Diagnosis not present

## 2019-09-17 DIAGNOSIS — I129 Hypertensive chronic kidney disease with stage 1 through stage 4 chronic kidney disease, or unspecified chronic kidney disease: Secondary | ICD-10-CM | POA: Diagnosis not present

## 2019-09-17 DIAGNOSIS — N183 Chronic kidney disease, stage 3 unspecified: Secondary | ICD-10-CM | POA: Diagnosis not present

## 2019-09-17 DIAGNOSIS — M159 Polyosteoarthritis, unspecified: Secondary | ICD-10-CM | POA: Diagnosis not present

## 2019-09-18 DIAGNOSIS — I351 Nonrheumatic aortic (valve) insufficiency: Secondary | ICD-10-CM | POA: Diagnosis not present

## 2019-09-18 DIAGNOSIS — G301 Alzheimer's disease with late onset: Secondary | ICD-10-CM | POA: Diagnosis not present

## 2019-09-18 DIAGNOSIS — I129 Hypertensive chronic kidney disease with stage 1 through stage 4 chronic kidney disease, or unspecified chronic kidney disease: Secondary | ICD-10-CM | POA: Diagnosis not present

## 2019-09-18 DIAGNOSIS — M549 Dorsalgia, unspecified: Secondary | ICD-10-CM | POA: Diagnosis not present

## 2019-09-18 DIAGNOSIS — N183 Chronic kidney disease, stage 3 unspecified: Secondary | ICD-10-CM | POA: Diagnosis not present

## 2019-09-18 DIAGNOSIS — M159 Polyosteoarthritis, unspecified: Secondary | ICD-10-CM | POA: Diagnosis not present

## 2019-09-18 DIAGNOSIS — H409 Unspecified glaucoma: Secondary | ICD-10-CM | POA: Diagnosis not present

## 2019-09-18 DIAGNOSIS — F0280 Dementia in other diseases classified elsewhere without behavioral disturbance: Secondary | ICD-10-CM | POA: Diagnosis not present

## 2019-09-18 DIAGNOSIS — L299 Pruritus, unspecified: Secondary | ICD-10-CM | POA: Diagnosis not present

## 2019-09-19 DIAGNOSIS — G301 Alzheimer's disease with late onset: Secondary | ICD-10-CM | POA: Diagnosis not present

## 2019-09-19 DIAGNOSIS — M549 Dorsalgia, unspecified: Secondary | ICD-10-CM | POA: Diagnosis not present

## 2019-09-19 DIAGNOSIS — L299 Pruritus, unspecified: Secondary | ICD-10-CM | POA: Diagnosis not present

## 2019-09-19 DIAGNOSIS — F0280 Dementia in other diseases classified elsewhere without behavioral disturbance: Secondary | ICD-10-CM | POA: Diagnosis not present

## 2019-09-19 DIAGNOSIS — I129 Hypertensive chronic kidney disease with stage 1 through stage 4 chronic kidney disease, or unspecified chronic kidney disease: Secondary | ICD-10-CM | POA: Diagnosis not present

## 2019-09-19 DIAGNOSIS — H409 Unspecified glaucoma: Secondary | ICD-10-CM | POA: Diagnosis not present

## 2019-09-19 DIAGNOSIS — M159 Polyosteoarthritis, unspecified: Secondary | ICD-10-CM | POA: Diagnosis not present

## 2019-09-19 DIAGNOSIS — I351 Nonrheumatic aortic (valve) insufficiency: Secondary | ICD-10-CM | POA: Diagnosis not present

## 2019-09-19 DIAGNOSIS — N183 Chronic kidney disease, stage 3 unspecified: Secondary | ICD-10-CM | POA: Diagnosis not present

## 2019-09-22 ENCOUNTER — Ambulatory Visit (INDEPENDENT_AMBULATORY_CARE_PROVIDER_SITE_OTHER): Payer: Medicare HMO | Admitting: Family Medicine

## 2019-09-22 ENCOUNTER — Encounter: Payer: Self-pay | Admitting: Family Medicine

## 2019-09-22 ENCOUNTER — Other Ambulatory Visit: Payer: Self-pay

## 2019-09-22 VITALS — BP 118/60 | HR 76 | Temp 98.5°F | Resp 16 | Ht 64.0 in

## 2019-09-22 DIAGNOSIS — N1832 Chronic kidney disease, stage 3b: Secondary | ICD-10-CM

## 2019-09-22 DIAGNOSIS — I10 Essential (primary) hypertension: Secondary | ICD-10-CM | POA: Diagnosis not present

## 2019-09-22 DIAGNOSIS — G301 Alzheimer's disease with late onset: Secondary | ICD-10-CM

## 2019-09-22 DIAGNOSIS — F0280 Dementia in other diseases classified elsewhere without behavioral disturbance: Secondary | ICD-10-CM

## 2019-09-22 DIAGNOSIS — R29818 Other symptoms and signs involving the nervous system: Secondary | ICD-10-CM

## 2019-09-22 DIAGNOSIS — R197 Diarrhea, unspecified: Secondary | ICD-10-CM | POA: Diagnosis not present

## 2019-09-22 DIAGNOSIS — Z23 Encounter for immunization: Secondary | ICD-10-CM

## 2019-09-22 MED ORDER — AMLODIPINE BESYLATE 5 MG PO TABS: 5.0000 mg | ORAL_TABLET | Freq: Every day | ORAL | 2 refills | Status: DC

## 2019-09-22 NOTE — Patient Instructions (Signed)
A few things to remember from today's visit:   Late onset Alzheimer's disease without behavioral disturbance ([place])  Essential hypertension - Plan: BASIC METABOLIC PANEL WITH GFR, CBC with Differential/Platelet  Stage 3b chronic kidney disease - Plan: BASIC METABOLIC PANEL WITH GFR, VITAMIN D 25 Hydroxy (Vit-D Deficiency, Fractures), CBC with Differential/Platelet  If you need refills please call your pharmacy. Do not use My Chart to request refills or for acute issues that need immediate attention.   Today Amlodipine decreased to 5 mg daily. Stop supplements. No changes in rest of meds. Hydration.  Please be sure medication list is accurate. If a new problem present, please set up appointment sooner than planned today.

## 2019-09-22 NOTE — Progress Notes (Signed)
Chief Complaint  Patient presents with  . Follow-up    tia episode   HPI: [name] is a 86 y.o. female with history of hypertension CKD 3, non rheumatic AI,Alzheimer's dementia, and chronic back pain here today with her daughter complaining of episode of "TIA" , no prior hx. Her daughter provides most of hx.  On 09/11/19 she had a transient facial weakness, could not drink from a cup,drooling. She is not sure which side was affected, it lasted about 60 sec and back to her baseline. Her daughter called EMS to evaluate pt, VS stable and was not deemed necessary to take her to the ER. PT going to the house and reported to daughter mild left UE weakness but her daughter has not noted new focal weakness. She has [facility], which provides some help for her daughter,who is her caregiver.  Decreased appetite for a week or so. Sleeping most of the day and fatigue. MS is at her baseline. Diarrhea x 1 today am, which she has sometimes , not unusual. No blood in stool or melena. Recently she had some [place] food.  No blood in stool,N/V,or abdominal pain. She has not had fever,cough,wheezing,SOB, or skin rash.  Neck pain, intermittently, not radiated. Problem has been going on for a while. Pain is exacerbated by movement. No Hx of trauma. Zanaflex has helped but her daughter stopped med afraid of possible interaction with other medications.  HTN: Last visit amlodipine was increased from 5 mg to 7.5 mg. She is also on hydroxyzine 10 mg 3 times daily and metoprolol tartrate 25 mg twice daily.  BP readings at home on the lower normal range:110's-120/50's-60's,occasionally SBP 140.  Negative for severe/frequent headache, chest pain,or edema. CKD III, negative for decreased urine output,foam in urine,or gross hematuria. + Anemia of chronic disease.  Component     Latest Ref Rng & Units 01/27/2019  WBC     3.8 - 10.8 Thousand/uL 11.0 (H)  RBC     3.80 - 5.10 Million/uL 3.31  (L)  Hemoglobin     11.7 - 15.5 g/dL 8.4 (L)  HCT     35 - 45 % 27.2 (L)  MCV     80.0 - 100.0 fL 82.2  MCH     27.0 - 33.0 pg 25.4 (L)  MCHC     32.0 - 36.0 g/dL 30.9  RDW     11.0 - 15.0 % 15.3  Platelets     140 - 400 Thousand/uL 315  nRBC     0.0 - 0.2 % 0.0   She sees cardiologist regularly and has an appt coming. Her daughter has difficulty bringing her to OV's and wonders if it is necessary to continue visits given the fact she is otherwise stable. She is DNR and her daughter wants to keep her comfortable ,no hospitalizations or invasive procedures. SVT and HLD: Last visit with Dr Gwenlyn Found was on 05/24/19.  Lab Results  Component Value Date   CHOL 182 01/22/2014   HDL 47.20 01/22/2014   LDLCALC 110 (H) 01/22/2014   LDLDIRECT 130.2 12/23/2012   TRIG 126.0 01/22/2014   CHOLHDL 4 01/22/2014   Component     Latest Ref Rng & Units 01/27/2019  Sodium     135 - 146 mmol/L 137  Potassium     3.5 - 5.3 mmol/L 3.9  Chloride     98 - 110 mmol/L 106  CO2     20 - 32 mmol/L 21 (L)  Glucose     65 - 99 mg/dL 93  BUN     7 - 25 mg/dL 17  Creatinine     0.60 - 0.88 mg/dL 1.02 (H)  Calcium     8.6 - 10.4 mg/dL 8.8 (L)  GFR, Est Non African American     > OR = 60 mL/min/1.70m 49 (L)  GFR, Est African American     > OR = 60 mL/min/1.782m56 (L)  Anion gap     5 - 15 10   She is on vit D supplementation,B12,and multivitamins. She does not want to take these supplements, no known hx of vit D or B12 deficiency.  Alzheimer's dementia: Currently she is on Aricept 5 mg daily, which she has tolerated well and seems to be helping.  Review of Systems  Constitutional: Positive for activity change, appetite change and fatigue. Negative for fever.  HENT: Negative for rhinorrhea and sore throat.   Endocrine: Negative for cold intolerance and heat intolerance.  Genitourinary: Negative for difficulty urinating and dysuria.  Musculoskeletal: Positive for arthralgias, back pain and  gait problem.  Skin: Negative for rash and wound.  Psychiatric/Behavioral: Negative for hallucinations.  Rest see pertinent positives and negatives per HPI.  Current Outpatient Medications on File Prior to Visit  Medication Sig Dispense Refill  . Cholecalciferol (VITAMIN D-3) 125 MCG (5000 UT) TABS Take 5,000 Units by mouth daily with breakfast.    . clotrimazole-betamethasone (LOTRISONE) cream Apply 1 application topically 2 (two) times daily. 30 g 1  . donepezil (ARICEPT) 5 MG tablet TAKE 1 TABLET(5 MG) BY MOUTH AT BEDTIME 90 tablet 2  . hydrALAZINE (APRESOLINE) 10 MG tablet TAKE 1 TABLET(10 MG) BY MOUTH EVERY 8 HOURS 90 tablet 3  . metoprolol tartrate (LOPRESSOR) 25 MG tablet TAKE 1 TABLET(25 MG) BY MOUTH TWICE DAILY 60 tablet 3  . Multiple Vitamins-Minerals (ONE-A-DAY WOMENS 50 PLUS) TABS Take 1 tablet by mouth daily with breakfast.    . vitamin B-12 1000 MCG tablet Take 1 tablet (1,000 mcg total) by mouth daily with breakfast.     No current facility-administered medications on file prior to visit.   Past Medical History:  Diagnosis Date  . Aortic valve disorders    moderate aortic insufficiency  . Atypical mole    nose  . Dizziness   . Glaucoma   . Nipple problem    No Known Allergies  Social History   Socioeconomic History  . Marital status: Widowed    Spouse name: Not on file  . Number of children: Not on file  . Years of education: Not on file  . Highest education level: Not on file  Occupational History  . Not on file  Tobacco Use  . Smoking status: Never Smoker  . Smokeless tobacco: Never Used  Substance and Sexual Activity  . Alcohol use: No  . Drug use: No  . Sexual activity: Not on file  Other Topics Concern  . Not on file  Social History Narrative  . Not on file   Social Determinants of Health   Financial Resource Strain:   . Difficulty of Paying Living Expenses: Not on file  Food Insecurity:   . Worried About RuCharity fundraisern the Last  Year: Not on file  . Ran Out of Food in the Last Year: Not on file  Transportation Needs:   . Lack of Transportation (Medical): Not on file  . Lack of Transportation (Non-Medical): Not on file  Physical Activity:   .  Days of Exercise per Week: Not on file  . Minutes of Exercise per Session: Not on file  Stress:   . Feeling of Stress : Not on file  Social Connections:   . Frequency of Communication with Friends and Family: Not on file  . Frequency of Social Gatherings with Friends and Family: Not on file  . Attends Religious Services: Not on file  . Active Member of Clubs or Organizations: Not on file  . Attends Archivist Meetings: Not on file  . Marital Status: Not on file    Vitals:   09/22/19 1425  BP: 118/60  Pulse: 76  Resp: 16  Temp: 98.5 F (36.9 C)  SpO2: 97%   Body mass index is 20.67 kg/m.  Physical Exam Vitals and nursing note reviewed.  Constitutional:      General: She is not in acute distress.    Appearance: She is well-developed. She is not ill-appearing.  HENT:     Head: Normocephalic and atraumatic.     Mouth/Throat:     Mouth: Mucous membranes are moist. No oral lesions.     Dentition: Abnormal dentition.  Eyes:     Conjunctiva/sclera: Conjunctivae normal.  Cardiovascular:     Rate and Rhythm: Normal rate and regular rhythm.     Heart sounds: No murmur heard.   Pulmonary:     Effort: Pulmonary effort is normal. No respiratory distress.     Breath sounds: Normal breath sounds.  Abdominal:     Palpations: Abdomen is soft.     Tenderness: There is no abdominal tenderness.  Musculoskeletal:     Cervical back: Spasms and tenderness present. Decreased range of motion.  Skin:    General: Skin is warm.     Findings: No erythema or rash.  Neurological:     Mental Status: She is alert.     Comments: I do not appreciate focal deficit. She is on a wheel chair. Oriented in person and place.  Psychiatric:        Behavior: Behavior is  cooperative.     Comments: Well groomed, good eye contact.   ASSESSMENT AND PLAN:  Ms.Cameo was seen today for follow-up.  Diagnoses and all orders for this visit: Orders Placed This Encounter  Procedures  . Flu Vaccine QUAD High Dose(Fluad)  . BASIC METABOLIC PANEL WITH GFR  . VITAMIN D 25 Hydroxy (Vit-D Deficiency, Fractures)  . CBC with Differential/Platelet  . Urinalysis, Routine w reflex microscopic    Lab Results  Component Value Date   CREATININE 1.07 (H) 09/22/2019   BUN 18 09/22/2019   NA 132 (L) 09/22/2019   K 4.3 09/22/2019   CL 101 09/22/2019   CO2 20 09/22/2019   Lab Results  Component Value Date   WBC 12.2 (H) 09/22/2019   HGB 6.9 (L) 09/22/2019   HCT 23.9 (L) 09/22/2019   MCV 83.0 09/22/2019   PLT 426 (H) 09/22/2019    Transient neurologic deficit Examination today with no noticeable focal deficit. TIA is certainly in the differential Dx's. For now I did not think brain imagine is needed,daughter agrees. Further recommendation will be given according to lab results.  Diarrhea, unspecified type According to her daughter, this is something that happens occasionally mainly after eating spicy food. Recommend trying to avoid trigger factors, risk of electrolyte abnormalities. Follow-up as needed.  Late onset Alzheimer's disease without behavioral disturbance ([place]) Gradually getting worse. Decreased appetite is part of the problems and can get worse. Offer p.o. fluids  regularly through the day. Continue Aricept 5 mg daily. She can stop multivitamins and supplements. She is receiving palliative care services.  Essential hypertension BP on lower normal range. Recommend decreasing amlodipine from 7.5 mg to 5 mg. No changes in rest of her medications. Continue monitoring BP closely.  -     amLODipine (NORVASC) 5 MG tablet; Take 1 tablet (5 mg total) by mouth at bedtime. TAKE 1 TABLET(5 MG) BY MOUTH DAILY  Stage 3b chronic kidney disease Cr1.0-1.2 and  eGFR has been in the high 30's and low 40's. Continue adequate hydration,low salt diet, and BP controlled.  Need for influenza vaccination -     Flu Vaccine QUAD High Dose(Fluad)   Return if symptoms worsen or fail to improve, for keep next appt..   Lumi Winslett G. Martinique, MD  [facility]. [place] office.  A few things to remember from today's visit:   Late onset Alzheimer's disease without behavioral disturbance ([place])  Essential hypertension - Plan: BASIC METABOLIC PANEL WITH GFR, CBC with Differential/Platelet  Stage 3b chronic kidney disease - Plan: BASIC METABOLIC PANEL WITH GFR, VITAMIN D 25 Hydroxy (Vit-D Deficiency, Fractures), CBC with Differential/Platelet  If you need refills please call your pharmacy. Do not use My Chart to request refills or for acute issues that need immediate attention.   Today Amlodipine decreased to 5 mg daily. Stop supplements. No changes in rest of meds. Hydration.  Please be sure medication list is accurate. If a new problem present, please set up appointment sooner than planned today.

## 2019-09-23 ENCOUNTER — Encounter: Payer: Self-pay | Admitting: Family Medicine

## 2019-09-23 ENCOUNTER — Emergency Department (HOSPITAL_COMMUNITY)
Admission: EM | Admit: 2019-09-23 | Discharge: 2019-09-23 | Disposition: A | Discharge status: Home / Self Care | Payer: Medicare HMO | Attending: Emergency Medicine | Admitting: Emergency Medicine

## 2019-09-23 ENCOUNTER — Encounter (HOSPITAL_COMMUNITY): Payer: Self-pay

## 2019-09-23 DIAGNOSIS — Z79899 Other long term (current) drug therapy: Secondary | ICD-10-CM | POA: Diagnosis not present

## 2019-09-23 DIAGNOSIS — N390 Urinary tract infection, site not specified: Secondary | ICD-10-CM | POA: Insufficient documentation

## 2019-09-23 DIAGNOSIS — N183 Chronic kidney disease, stage 3 unspecified: Secondary | ICD-10-CM | POA: Diagnosis not present

## 2019-09-23 DIAGNOSIS — F0390 Unspecified dementia without behavioral disturbance: Secondary | ICD-10-CM | POA: Insufficient documentation

## 2019-09-23 DIAGNOSIS — R531 Weakness: Secondary | ICD-10-CM | POA: Diagnosis present

## 2019-09-23 DIAGNOSIS — D649 Anemia, unspecified: Secondary | ICD-10-CM | POA: Diagnosis not present

## 2019-09-23 DIAGNOSIS — I129 Hypertensive chronic kidney disease with stage 1 through stage 4 chronic kidney disease, or unspecified chronic kidney disease: Secondary | ICD-10-CM | POA: Insufficient documentation

## 2019-09-23 LAB — COMPREHENSIVE METABOLIC PANEL
ALT: 13 U/L (ref 0–44)
AST: 20 U/L (ref 15–41)
Albumin: 2.9 g/dL — ABNORMAL LOW (ref 3.5–5.0)
Alkaline Phosphatase: 65 U/L (ref 38–126)
Anion gap: 11 (ref 5–15)
BUN: 17 mg/dL (ref 8–23)
CO2: 19 mmol/L — ABNORMAL LOW (ref 22–32)
Calcium: 8.9 mg/dL (ref 8.9–10.3)
Chloride: 100 mmol/L (ref 98–111)
Creatinine, Ser: 0.95 mg/dL (ref 0.44–1.00)
GFR calc Af Amer: >60 mL/min (ref >60)
GFR calc non Af Amer: 53 mL/min — ABNORMAL LOW (ref >60)
Glucose, Bld: 116 mg/dL — ABNORMAL HIGH (ref 70–99)
Potassium: 4.4 mmol/L (ref 3.5–5.1)
Sodium: 130 mmol/L — ABNORMAL LOW (ref 135–145)
Total Bilirubin: 0.5 mg/dL (ref 0.3–1.2)
Total Protein: 6.8 g/dL (ref 6.5–8.1)

## 2019-09-23 LAB — URINALYSIS, ROUTINE W REFLEX MICROSCOPIC
Bilirubin Urine: NEGATIVE
Glucose, UA: NEGATIVE mg/dL
Hgb urine dipstick: NEGATIVE
Ketones, ur: NEGATIVE mg/dL
Nitrite: NEGATIVE
Protein, ur: NEGATIVE mg/dL
Specific Gravity, Urine: 1.016 (ref 1.005–1.030)
pH: 5 (ref 5.0–8.0)

## 2019-09-23 LAB — PREPARE RBC (CROSSMATCH)

## 2019-09-23 LAB — CBC
HCT: 23.3 % — ABNORMAL LOW (ref 36.0–46.0)
Hemoglobin: 7.1 g/dL — ABNORMAL LOW (ref 12.0–15.0)
MCH: 24.6 pg — ABNORMAL LOW (ref 26.0–34.0)
MCHC: 30.5 g/dL (ref 30.0–36.0)
MCV: 80.6 fL (ref 80.0–100.0)
Platelets: 414 10*3/uL — ABNORMAL HIGH (ref 150–400)
RBC: 2.89 MIL/uL — ABNORMAL LOW (ref 3.87–5.11)
RDW: 15.7 % — ABNORMAL HIGH (ref 11.5–15.5)
WBC: 15.5 10*3/uL — ABNORMAL HIGH (ref 4.0–10.5)
nRBC: 0 % (ref 0.0–0.2)

## 2019-09-23 LAB — CBC WITH DIFFERENTIAL/PLATELET
Absolute Monocytes: 671 cells/uL (ref 200–950)
Basophils Absolute: 37 cells/uL (ref 0–200)
Basophils Relative: 0.3 %
Eosinophils Absolute: 146 cells/uL (ref 15–500)
Eosinophils Relative: 1.2 %
HCT: 23.9 % — ABNORMAL LOW (ref 35.0–45.0)
Hemoglobin: 6.9 g/dL — ABNORMAL LOW (ref 11.7–15.5)
Lymphs Abs: 2074 cells/uL (ref 850–3900)
MCH: 24 pg — ABNORMAL LOW (ref 27.0–33.0)
MCHC: 28.9 g/dL — ABNORMAL LOW (ref 32.0–36.0)
MCV: 83 fL (ref 80.0–100.0)
MPV: 9.8 fL (ref 7.5–12.5)
Monocytes Relative: 5.5 %
Neutro Abs: 9272 cells/uL — ABNORMAL HIGH (ref 1500–7800)
Neutrophils Relative %: 76 %
Platelets: 426 10*3/uL — ABNORMAL HIGH (ref 140–400)
RBC: 2.88 10*6/uL — ABNORMAL LOW (ref 3.80–5.10)
RDW: 14.2 % (ref 11.0–15.0)
Total Lymphocyte: 17 %
WBC: 12.2 10*3/uL — ABNORMAL HIGH (ref 3.8–10.8)

## 2019-09-23 LAB — DIFFERENTIAL
Abs Immature Granulocytes: 0.12 10*3/uL — ABNORMAL HIGH (ref 0.00–0.07)
Basophils Absolute: 0 10*3/uL (ref 0.0–0.1)
Basophils Relative: 0 %
Eosinophils Absolute: 0.1 10*3/uL (ref 0.0–0.5)
Eosinophils Relative: 1 %
Immature Granulocytes: 1 %
Lymphocytes Relative: 16 %
Lymphs Abs: 2.4 10*3/uL (ref 0.7–4.0)
Monocytes Absolute: 0.6 10*3/uL (ref 0.1–1.0)
Monocytes Relative: 4 %
Neutro Abs: 11.8 10*3/uL — ABNORMAL HIGH (ref 1.7–7.7)
Neutrophils Relative %: 78 %

## 2019-09-23 LAB — BASIC METABOLIC PANEL WITH GFR
BUN/Creatinine Ratio: 17 (calc) (ref 6–22)
BUN: 18 mg/dL (ref 7–25)
CO2: 20 mmol/L (ref 20–32)
Calcium: 8.7 mg/dL (ref 8.6–10.4)
Chloride: 101 mmol/L (ref 98–110)
Creat: 1.07 mg/dL — ABNORMAL HIGH (ref 0.60–0.88)
GFR, Est African American: 53 mL/min/{1.73_m2} — ABNORMAL LOW (ref >=60)
GFR, Est Non African American: 46 mL/min/{1.73_m2} — ABNORMAL LOW (ref >=60)
Glucose, Bld: 196 mg/dL — ABNORMAL HIGH (ref 65–99)
Potassium: 4.3 mmol/L (ref 3.5–5.3)
Sodium: 132 mmol/L — ABNORMAL LOW (ref 135–146)

## 2019-09-23 LAB — VITAMIN D 25 HYDROXY (VIT D DEFICIENCY, FRACTURES): Vit D, 25-Hydroxy: 45 ng/mL (ref 30–100)

## 2019-09-23 LAB — POC OCCULT BLOOD, ED: Fecal Occult Bld: NEGATIVE

## 2019-09-23 MED ORDER — SODIUM CHLORIDE 0.9% IV SOLUTION: Freq: Once | INTRAVENOUS | Status: AC

## 2019-09-23 MED ORDER — CEPHALEXIN 500 MG PO CAPS: 500.0000 mg | ORAL_CAPSULE | Freq: Three times a day (TID) | ORAL | 0 refills | Status: DC

## 2019-09-23 MED ORDER — SODIUM CHLORIDE 0.9 % IV SOLN
1.0000 g | Freq: Once | INTRAVENOUS | Status: AC
  Administered 2019-09-23: 1 g via INTRAVENOUS
  Filled 2019-09-23: qty 10

## 2019-09-23 NOTE — ED Provider Notes (Signed)
[facility]  Provider Note  CSN: [id number]  Arrival date & time: 09/23/19 1020   History     Chief Complaint  Patient presents with  . Abnormal Lab   Natalie Fitzgerald is a 86 y.o. female presenting for evaluation of generalized weakness and low hgb.   Level 5 caveat due to dementia.   Daughter states patient has had gradually worsening weakness over the past several months. This is been notable over the past 2 months. Patient saw her PCP yesterday, and today was called to come to the ER due to low blood counts. Daughter states patient is not able to participate in physical therapy as much as she used to. Daughter denies fevers, chills, cough, vomiting, complaints of chest or abdominal pain, change in urination, or bowel movements. Patient is not on a blood thinner. She is not having any hematemesis, obvious hematuria, or abnormal color of the stool. Patient had similar anemia 9 months ago, was admitted and given a blood transfusion and had significant improvement of symptoms. Unknown cause for anemia at that time.   Additional history obtained from chart review. Patient with a history of Alzheimer's, anemia, CKD, hypertension. I reviewed patient's hospitalization in January of this year for anemia, her hemoglobin was 6.5 at the time, baseline between 8 and 9.   HPI       Past Medical History:  Diagnosis Date  . Aortic valve disorders    moderate aortic insufficiency  . Atypical mole    nose  . Dizziness   . Glaucoma   . Nipple problem        Patient Active Problem List   Diagnosis Date Noted  . Late onset Alzheimer's disease without behavioral disturbance ([place]) 08/25/2019  . SVT (supraventricular tachycardia) ([place]) 01/22/2019  . Unresponsive episode 01/22/2019  . Anemia 01/22/2019  . DNR no code (do not resuscitate) 12/24/2018  . Unstable gait 06/25/2017  . CKD (chronic kidney disease), stage III 06/25/2017  . Aortic insufficiency 01/14/2014   . Hypertension 09/13/2010  . Left breast mass, uoq 07/19/2010  . SEBORRHEIC KERATOSIS 11/27/2008  . LOW BACK PAIN 01/24/2007  . OSTEOPENIA 01/24/2007  . Hyperlipemia 01/09/2007  . Allergic rhinitis 01/09/2007        Past Surgical History:  Procedure Laterality Date  . APPENDECTOMY    . BACK SURGERY     OB History   No obstetric history on file.          Family History  Problem Relation Age of Onset  . Cancer Mother    kidney  . Alcohol abuse Father    Social History       Tobacco Use  . Smoking status: Never Smoker  . Smokeless tobacco: Never Used  Substance Use Topics  . Alcohol use: No  . Drug use: No   Home Medications         Prior to Admission medications   Medication Sig Start Date End Date Taking? Authorizing Provider  amLODipine (NORVASC) 5 MG tablet Take 1 tablet (5 mg total) by mouth at bedtime. TAKE 1 TABLET(5 MG) BY MOUTH DAILY 09/22/19  Yes Martinique, Betty G, MD  Cholecalciferol (VITAMIN D-3) 125 MCG (5000 UT) TABS Take 5,000 Units by mouth daily with breakfast.   Yes [provider]  clotrimazole-betamethasone (LOTRISONE) cream Apply 1 application topically 2 (two) times daily. 08/25/19  Yes Martinique, Betty G, MD  donepezil (ARICEPT) 5 MG tablet TAKE 1 TABLET(5 MG) BY MOUTH AT BEDTIME 08/25/19  Yes Martinique, Betty G, MD  hydrALAZINE (APRESOLINE) 10 MG tablet TAKE 1 TABLET(10 MG) BY MOUTH EVERY 8 HOURS  Patient taking differently: Take 10 mg by mouth every 8 (eight) hours. At 0800, 1600, and 0000 09/16/19  Yes Martinique, Betty G, MD  metoprolol tartrate (LOPRESSOR) 25 MG tablet TAKE 1 TABLET(25 MG) BY MOUTH TWICE DAILY  Patient taking differently: Take 25 mg by mouth 2 (two) times daily. At 0800 and 1600 09/16/19  Yes Martinique, Betty G, MD  Multiple Vitamins-Minerals (ONE-A-DAY WOMENS 50 PLUS) TABS Take 1 tablet by mouth daily with breakfast.   Yes [provider]  vitamin B-12 1000 MCG tablet Take 1 tablet (1,000 mcg total) by mouth daily with  breakfast. 01/29/19  Yes Geradine Girt, DO   Allergies  Patient has no known allergies.  Review of Systems  Review of Systems  Unable to perform ROS: Dementia  Neurological: Positive for weakness.    Physical Exam  Updated Vital Signs  BP (!) 145/64 (BP Location: Left Arm)  Pulse 70  Temp 98.4 F (36.9 C) (Oral)  Resp 16  SpO2 100%  Physical Exam  Vitals and nursing note reviewed. Exam conducted with a chaperone present.  Constitutional:  General: She is not in acute distress. Appearance: She is well-developed.  Comments: Appears nontoxic. Pleasantly confused  HENT:  Head: Normocephalic and atraumatic.  Eyes:  Extraocular Movements: Extraocular movements intact.  Conjunctiva/sclera: Conjunctivae normal.  Pupils: Pupils are equal, round, and reactive to light.  Cardiovascular:  Rate and Rhythm: Normal rate and regular rhythm.  Pulses: Normal pulses.  Pulmonary:  Effort: Pulmonary effort is normal. No respiratory distress.  Breath sounds: Normal breath sounds. No wheezing.  Abdominal:  General: There is no distension.  Palpations: Abdomen is soft. There is no mass.  Tenderness: There is no abdominal tenderness. There is no guarding or rebound.  Genitourinary:  Comments: No gross blood on rectal exam. Hemoccult negative. Musculoskeletal:  General: Normal range of motion.  Cervical back: Normal range of motion and neck supple.  Skin:  General: Skin is warm and dry.  Neurological:  Mental Status: Mental status is at baseline.    ED Results / Procedures / Treatments  Labs  (all labs ordered are listed, but only abnormal results are displayed)       Labs Reviewed  COMPREHENSIVE METABOLIC PANEL - Abnormal; Notable for the following components:  Result Value    Sodium 130 (*)    CO2 19 (*)    Glucose, Bld 116 (*)    Albumin 2.9 (*)    GFR calc non Af Amer 53 (*)    All other components within normal limits  CBC - Abnormal; Notable for the following components:     WBC 15.5 (*)    RBC 2.89 (*)    Hemoglobin 7.1 (*)    HCT 23.3 (*)    MCH 24.6 (*)    RDW 15.7 (*)    Platelets 414 (*)    All other components within normal limits  DIFFERENTIAL - Abnormal; Notable for the following components:   Neutro Abs 11.8 (*)    Abs Immature Granulocytes 0.12 (*)    All other components within normal limits  URINE CULTURE  URINALYSIS, ROUTINE W REFLEX MICROSCOPIC  POC OCCULT BLOOD, ED  TYPE AND SCREEN  PREPARE RBC (CROSSMATCH)   EKG  None  Radiology   Imaging Results (Last 48 hours)    Procedures  .Critical Care  Performed by: Franchot Heidelberg, PA-C  Authorized by: Franchot Heidelberg, PA-C  Critical care provider statement:  Critical care time (minutes): 40  Critical care time was exclusive of: Separately billable procedures and treating other patients and teaching time  Critical care was necessary to treat or prevent imminent or life-threatening deterioration of the following conditions: Circulatory failure  Critical care was time spent personally by me on the following activities: Blood draw for specimens, development of treatment plan with patient or surrogate, evaluation of patient's response to treatment, examination of patient, obtaining history from patient or surrogate, ordering and performing treatments and interventions, ordering and review of laboratory studies, ordering and review of radiographic studies, pulse oximetry, re-evaluation of patient's condition and review of old charts I assumed direction of critical care for this patient from another provider in my specialty: no  Comments:  Patient with low hemoglobin requiring transfusion.  (including critical care time)  Medications Ordered in ED  Medications  0.9 % sodium chloride infusion (Manually program via Guardrails IV Fluids) ( Intravenous New Bag/Given 09/23/19 1615)   ED Course  I have reviewed the triage vital signs and the nursing notes.  Pertinent labs & imaging results  that were available during my care of the patient were reviewed by me and considered in my medical decision making (see chart for details).   MDM Rules/Calculators/A&P   Pt presenting for evaluation of generalized weakness and low blood counts. On exam, patient is nontoxic. No obvious source of bleeding. She is not on blood thinners. Today her hemoglobin is 7.1, similar to yesterday 6.9. This is not too far off patient's baseline of between 8 and 9, however could be contributing to her weakness. Vital signs are stable. Remaining labs interpreted by me, overall reassuring. Case discussed with attending, Dr. Zenia Resides evaluated the patient. Will plan for transfusion of 1 unit of blood and discharge. Patient also pending a urine to ensure no UTI.  Urine with leuks and few bacteria. Due to reports of weakness, will tx for mild UTI. Will send for culture. On reassessment, pt states she is feeling well. Discussed findings and plan with patient daughter, who is agreeable.  Encourage close follow-up with PCP for recheck of labs.  At this time, patient be safe discharge.  Return precautions given.  Patient and daughter state they understand and agree to plan.   Franchot Heidelberg, PA-C 09/23/19 2240    Lacretia Leigh, MD 09/24/19 [phone]

## 2019-09-23 NOTE — ED Provider Notes (Signed)
Medical screening examination/treatment/procedure(s) were conducted as a shared visit with non-physician practitioner(s) and myself.  I personally evaluated the patient during the encounter.    This is an 86 year old female here due to low hemoglobin of unclear etiology.  The patient has a longstanding history of this.  No clear etiology found this time.  Patient's hemoglobin here is 7.1.  Will transfuse 1 unit of blood and discharge home   Lacretia Leigh, MD 09/23/19 1517

## 2019-09-23 NOTE — Discharge Instructions (Addendum)
Take antibiotics as prescribed. Take the entire course, even if symptoms improve.  Follow-up with your primary care doctor for recheck of blood counts and symptoms within the next week. Return to the emergency room with any new, worsening, concerning symptoms.

## 2019-09-23 NOTE — ED Triage Notes (Addendum)
Pt arrives today with HPOA, daughter. Pt was sent by PCP. Pt's hgb is "6.something" per daughter. Pt has been having a slow increase in generalized weakness. No blood noted in stool per daughter. Pt's daughter states that PCP advised them to have a UA done as well. Pt does not ambulate alone normally.

## 2019-09-24 LAB — BPAM RBC
Blood Product Expiration Date: 202109192359
ISSUE DATE / TIME: 202109141610
Unit Type and Rh: 600

## 2019-09-24 LAB — TYPE AND SCREEN
ABO/RH(D): A NEG
Antibody Screen: NEGATIVE
Unit division: 0

## 2019-09-24 LAB — URINE CULTURE

## 2019-09-25 DIAGNOSIS — M159 Polyosteoarthritis, unspecified: Secondary | ICD-10-CM | POA: Diagnosis not present

## 2019-09-25 DIAGNOSIS — G301 Alzheimer's disease with late onset: Secondary | ICD-10-CM | POA: Diagnosis not present

## 2019-09-25 DIAGNOSIS — L299 Pruritus, unspecified: Secondary | ICD-10-CM | POA: Diagnosis not present

## 2019-09-25 DIAGNOSIS — M549 Dorsalgia, unspecified: Secondary | ICD-10-CM | POA: Diagnosis not present

## 2019-09-25 DIAGNOSIS — N183 Chronic kidney disease, stage 3 unspecified: Secondary | ICD-10-CM | POA: Diagnosis not present

## 2019-09-25 DIAGNOSIS — I351 Nonrheumatic aortic (valve) insufficiency: Secondary | ICD-10-CM | POA: Diagnosis not present

## 2019-09-25 DIAGNOSIS — I129 Hypertensive chronic kidney disease with stage 1 through stage 4 chronic kidney disease, or unspecified chronic kidney disease: Secondary | ICD-10-CM | POA: Diagnosis not present

## 2019-09-25 DIAGNOSIS — F0280 Dementia in other diseases classified elsewhere without behavioral disturbance: Secondary | ICD-10-CM | POA: Diagnosis not present

## 2019-09-25 DIAGNOSIS — H409 Unspecified glaucoma: Secondary | ICD-10-CM | POA: Diagnosis not present

## 2019-09-30 ENCOUNTER — Other Ambulatory Visit: Payer: Self-pay

## 2019-09-30 ENCOUNTER — Ambulatory Visit (INDEPENDENT_AMBULATORY_CARE_PROVIDER_SITE_OTHER): Payer: Medicare HMO | Admitting: Family Medicine

## 2019-09-30 ENCOUNTER — Encounter: Payer: Self-pay | Admitting: Family Medicine

## 2019-09-30 VITALS — BP 120/70 | HR 77 | Resp 16 | Ht 64.0 in

## 2019-09-30 DIAGNOSIS — N1832 Chronic kidney disease, stage 3b: Secondary | ICD-10-CM | POA: Diagnosis not present

## 2019-09-30 DIAGNOSIS — E871 Hypo-osmolality and hyponatremia: Secondary | ICD-10-CM

## 2019-09-30 DIAGNOSIS — D649 Anemia, unspecified: Secondary | ICD-10-CM | POA: Diagnosis not present

## 2019-09-30 DIAGNOSIS — I10 Essential (primary) hypertension: Secondary | ICD-10-CM

## 2019-09-30 MED ORDER — FERROUS SULFATE 325 (65 FE) MG PO TABS: ORAL_TABLET | ORAL | 3 refills | Status: DC

## 2019-09-30 NOTE — Patient Instructions (Addendum)
A few things to remember from today's visit:  Essential hypertension  Anemia, unspecified type - Plan: ferrous sulfate 325 (65 FE) MG tablet  Stage 3b chronic kidney disease  Hyponatremia  If you need refills please call your pharmacy. Do not use My Chart to request refills or for acute issues that need immediate attention.   Iron supplementation every 2-3 days with with orange juice.  Please be sure medication list is accurate. If a new problem present, please set up appointment sooner than planned today.

## 2019-09-30 NOTE — Progress Notes (Signed)
HPI: Natalie Fitzgerald is a 86 y.o. female with hx of dementia,HTN, SVT,and unstable gait here today with her daughter to follow on recent ER visit. She can provide some hx and her daughter complements it.  Last seen on 09/22/19. She was sent to the ER because H/H was  6.9/23.9. S/P RBC's transfusion. She has been more fatigue than usual and felt mildly better a few days after transfusion. Dx'ed and treated for UTI.  09/23/19 Ucx MULTIPLE SPECIES PRESENT, SUGGEST RECOLLECTIONAbnormal  No urinary symptoms at this time.  She is not on iron supplementation. POC occult blood was done and negative.  Negative for abdominal pain,N/V,changes in bowel habits,blood in stool,melena,and gross hematuria.  Lab Results  Component Value Date   WBC 15.5 (H) 09/23/2019   HGB 7.1 (L) 09/23/2019   HCT 23.3 (L) 09/23/2019   MCV 80.6 09/23/2019   PLT 414 (H) 09/23/2019   HypoNa++: She is drinking water mixed with Gatorade. MS is at her baseline.  HTN and CKD III: She is on Amlodipine 5 mg daily and Metoprolol 25 mg bid. Negative for headache,CP,SOB,decreased urine output,or edema.  Lab Results  Component Value Date   CREATININE 0.95 09/23/2019   BUN 17 09/23/2019   NA 130 (L) 09/23/2019   K 4.4 09/23/2019   CL 100 09/23/2019   CO2 19 (L) 09/23/2019   Review of Systems  Constitutional: Positive for fatigue. Negative for activity change and appetite change.  HENT: Positive for dental problem. Negative for mouth sores, nosebleeds and sore throat.   Eyes: Negative for redness and visual disturbance.  Respiratory: Negative for cough and wheezing.   Genitourinary: Negative for dysuria and pelvic pain.  Musculoskeletal: Positive for arthralgias and gait problem.  Neurological: Negative for syncope and headaches.  Rest see pertinent positives and negatives per HPI.  Current Outpatient Medications on File Prior to Visit  Medication Sig Dispense Refill  . amLODipine (NORVASC) 5 MG  tablet Take 1 tablet (5 mg total) by mouth at bedtime. TAKE 1 TABLET(5 MG) BY MOUTH DAILY 90 tablet 2  . Cholecalciferol (VITAMIN D-3) 125 MCG (5000 UT) TABS Take 5,000 Units by mouth daily with breakfast.    . clotrimazole-betamethasone (LOTRISONE) cream Apply 1 application topically 2 (two) times daily. 30 g 1  . donepezil (ARICEPT) 5 MG tablet TAKE 1 TABLET(5 MG) BY MOUTH AT BEDTIME 90 tablet 2  . hydrALAZINE (APRESOLINE) 10 MG tablet TAKE 1 TABLET(10 MG) BY MOUTH EVERY 8 HOURS (Patient taking differently: Take 10 mg by mouth every 8 (eight) hours. At 0800, 1600, and 0000) 90 tablet 3  . metoprolol tartrate (LOPRESSOR) 25 MG tablet TAKE 1 TABLET(25 MG) BY MOUTH TWICE DAILY (Patient taking differently: Take 25 mg by mouth 2 (two) times daily. At 0800 and 1600) 60 tablet 3  . Multiple Vitamins-Minerals (ONE-A-DAY WOMENS 50 PLUS) TABS Take 1 tablet by mouth daily with breakfast.    . vitamin B-12 1000 MCG tablet Take 1 tablet (1,000 mcg total) by mouth daily with breakfast.     No current facility-administered medications on file prior to visit.   Past Medical History:  Diagnosis Date  . Aortic valve disorders    moderate aortic insufficiency  . Atypical mole    nose  . Dizziness   . Glaucoma   . Nipple problem    No Known Allergies  Social History   Socioeconomic History  . Marital status: Widowed    Spouse name: Not on file  . Number of  children: Not on file  . Years of education: Not on file  . Highest education level: Not on file  Occupational History  . Not on file  Tobacco Use  . Smoking status: Never Smoker  . Smokeless tobacco: Never Used  Substance and Sexual Activity  . Alcohol use: No  . Drug use: No  . Sexual activity: Not on file  Other Topics Concern  . Not on file  Social History Narrative  . Not on file   Social Determinants of Health   Financial Resource Strain:   . Difficulty of Paying Living Expenses: Not on file  Food Insecurity:   . Worried About  Charity fundraiser in the Last Year: Not on file  . Ran Out of Food in the Last Year: Not on file  Transportation Needs:   . Lack of Transportation (Medical): Not on file  . Lack of Transportation (Non-Medical): Not on file  Physical Activity:   . Days of Exercise per Week: Not on file  . Minutes of Exercise per Session: Not on file  Stress:   . Feeling of Stress : Not on file  Social Connections:   . Frequency of Communication with Friends and Family: Not on file  . Frequency of Social Gatherings with Friends and Family: Not on file  . Attends Religious Services: Not on file  . Active Member of Clubs or Organizations: Not on file  . Attends Archivist Meetings: Not on file  . Marital Status: Not on file   Vitals:   09/30/19 1529  BP: 120/70  Pulse: 77  Resp: 16  SpO2: 98%   Body mass index is 20.67 kg/m.  Physical Exam Vitals and nursing note reviewed.  Constitutional:      General: She is not in acute distress.    Appearance: She is well-developed.  HENT:     Head: Normocephalic and atraumatic.     Mouth/Throat:     Mouth: Mucous membranes are moist.  Eyes:     Conjunctiva/sclera: Conjunctivae normal.  Cardiovascular:     Rate and Rhythm: Normal rate and regular rhythm.     Heart sounds: No murmur heard.   Pulmonary:     Effort: Pulmonary effort is normal. No respiratory distress.     Breath sounds: Normal breath sounds.  Abdominal:     Palpations: Abdomen is soft.     Tenderness: There is no abdominal tenderness.  Lymphadenopathy:     Cervical: No cervical adenopathy.  Skin:    General: Skin is warm.     Findings: No erythema or rash.  Neurological:     Mental Status: She is alert.     Cranial Nerves: No cranial nerve deficit.     Comments: She is in a wheel chair.  Psychiatric:     Comments: Well groomed, good eye contact.    ASSESSMENT AND PLAN:  Natalie Fitzgerald was seen today for hospitalization follow-up.  Diagnoses and all orders for this  visit:  Anemia, unspecified type H/H slight improvement, no significant improvement in regard to fatigue.  We can re-check CBC next visit but we need to discuss plan of care. If we are going to continue with labs and what to do if Hg falls again under 7.0.   -     ferrous sulfate 325 (65 FE) MG tablet; 1 tab every 2-3 days with vit C.  Essential hypertension BP adequately controlled. No changes in current management.  Stage 3b chronic kidney disease Cr  1.0-1.2, e GFR high 40's. Continue adequate hydration and adequate BP.  Hyponatremia We discussed dx and possible etiologies. Continue hydration with Gatorade + water. Instructed about warning signs.  Return in about 2 months (around 11/30/2019).   Christoffer Currier G. Martinique, MD  [facility]. [place] office.   A few things to remember from today's visit:  Essential hypertension  Anemia, unspecified type - Plan: ferrous sulfate 325 (65 FE) MG tablet  Stage 3b chronic kidney disease  Hyponatremia  If you need refills please call your pharmacy. Do not use My Chart to request refills or for acute issues that need immediate attention.   Iron supplementation every 2-3 days with with orange juice.  Please be sure medication list is accurate. If a new problem present, please set up appointment sooner than planned today.

## 2019-10-01 DIAGNOSIS — I10 Essential (primary) hypertension: Secondary | ICD-10-CM | POA: Diagnosis not present

## 2019-10-01 DIAGNOSIS — M549 Dorsalgia, unspecified: Secondary | ICD-10-CM | POA: Diagnosis not present

## 2019-10-01 DIAGNOSIS — I129 Hypertensive chronic kidney disease with stage 1 through stage 4 chronic kidney disease, or unspecified chronic kidney disease: Secondary | ICD-10-CM | POA: Diagnosis not present

## 2019-10-01 DIAGNOSIS — F0280 Dementia in other diseases classified elsewhere without behavioral disturbance: Secondary | ICD-10-CM | POA: Diagnosis not present

## 2019-10-01 DIAGNOSIS — L299 Pruritus, unspecified: Secondary | ICD-10-CM | POA: Diagnosis not present

## 2019-10-01 DIAGNOSIS — N183 Chronic kidney disease, stage 3 unspecified: Secondary | ICD-10-CM | POA: Diagnosis not present

## 2019-10-01 DIAGNOSIS — M159 Polyosteoarthritis, unspecified: Secondary | ICD-10-CM | POA: Diagnosis not present

## 2019-10-01 DIAGNOSIS — I351 Nonrheumatic aortic (valve) insufficiency: Secondary | ICD-10-CM | POA: Diagnosis not present

## 2019-10-01 DIAGNOSIS — G301 Alzheimer's disease with late onset: Secondary | ICD-10-CM | POA: Diagnosis not present

## 2019-10-01 DIAGNOSIS — H409 Unspecified glaucoma: Secondary | ICD-10-CM | POA: Diagnosis not present

## 2019-10-03 DIAGNOSIS — M159 Polyosteoarthritis, unspecified: Secondary | ICD-10-CM | POA: Diagnosis not present

## 2019-10-03 DIAGNOSIS — L299 Pruritus, unspecified: Secondary | ICD-10-CM | POA: Diagnosis not present

## 2019-10-03 DIAGNOSIS — F0280 Dementia in other diseases classified elsewhere without behavioral disturbance: Secondary | ICD-10-CM | POA: Diagnosis not present

## 2019-10-03 DIAGNOSIS — G301 Alzheimer's disease with late onset: Secondary | ICD-10-CM | POA: Diagnosis not present

## 2019-10-03 DIAGNOSIS — M549 Dorsalgia, unspecified: Secondary | ICD-10-CM | POA: Diagnosis not present

## 2019-10-03 DIAGNOSIS — I351 Nonrheumatic aortic (valve) insufficiency: Secondary | ICD-10-CM | POA: Diagnosis not present

## 2019-10-03 DIAGNOSIS — I129 Hypertensive chronic kidney disease with stage 1 through stage 4 chronic kidney disease, or unspecified chronic kidney disease: Secondary | ICD-10-CM | POA: Diagnosis not present

## 2019-10-03 DIAGNOSIS — H409 Unspecified glaucoma: Secondary | ICD-10-CM | POA: Diagnosis not present

## 2019-10-03 DIAGNOSIS — N183 Chronic kidney disease, stage 3 unspecified: Secondary | ICD-10-CM | POA: Diagnosis not present

## 2019-10-07 DIAGNOSIS — I129 Hypertensive chronic kidney disease with stage 1 through stage 4 chronic kidney disease, or unspecified chronic kidney disease: Secondary | ICD-10-CM | POA: Diagnosis not present

## 2019-10-07 DIAGNOSIS — H409 Unspecified glaucoma: Secondary | ICD-10-CM | POA: Diagnosis not present

## 2019-10-07 DIAGNOSIS — N183 Chronic kidney disease, stage 3 unspecified: Secondary | ICD-10-CM | POA: Diagnosis not present

## 2019-10-07 DIAGNOSIS — L299 Pruritus, unspecified: Secondary | ICD-10-CM | POA: Diagnosis not present

## 2019-10-07 DIAGNOSIS — I351 Nonrheumatic aortic (valve) insufficiency: Secondary | ICD-10-CM | POA: Diagnosis not present

## 2019-10-07 DIAGNOSIS — M549 Dorsalgia, unspecified: Secondary | ICD-10-CM | POA: Diagnosis not present

## 2019-10-07 DIAGNOSIS — G301 Alzheimer's disease with late onset: Secondary | ICD-10-CM | POA: Diagnosis not present

## 2019-10-07 DIAGNOSIS — F0280 Dementia in other diseases classified elsewhere without behavioral disturbance: Secondary | ICD-10-CM | POA: Diagnosis not present

## 2019-10-07 DIAGNOSIS — M159 Polyosteoarthritis, unspecified: Secondary | ICD-10-CM | POA: Diagnosis not present

## 2019-10-09 DIAGNOSIS — G301 Alzheimer's disease with late onset: Secondary | ICD-10-CM | POA: Diagnosis not present

## 2019-10-09 DIAGNOSIS — L299 Pruritus, unspecified: Secondary | ICD-10-CM | POA: Diagnosis not present

## 2019-10-09 DIAGNOSIS — F0280 Dementia in other diseases classified elsewhere without behavioral disturbance: Secondary | ICD-10-CM | POA: Diagnosis not present

## 2019-10-09 DIAGNOSIS — I351 Nonrheumatic aortic (valve) insufficiency: Secondary | ICD-10-CM | POA: Diagnosis not present

## 2019-10-09 DIAGNOSIS — M549 Dorsalgia, unspecified: Secondary | ICD-10-CM | POA: Diagnosis not present

## 2019-10-09 DIAGNOSIS — N183 Chronic kidney disease, stage 3 unspecified: Secondary | ICD-10-CM | POA: Diagnosis not present

## 2019-10-09 DIAGNOSIS — I129 Hypertensive chronic kidney disease with stage 1 through stage 4 chronic kidney disease, or unspecified chronic kidney disease: Secondary | ICD-10-CM | POA: Diagnosis not present

## 2019-10-09 DIAGNOSIS — H409 Unspecified glaucoma: Secondary | ICD-10-CM | POA: Diagnosis not present

## 2019-10-09 DIAGNOSIS — M159 Polyosteoarthritis, unspecified: Secondary | ICD-10-CM | POA: Diagnosis not present

## 2019-10-10 ENCOUNTER — Other Ambulatory Visit: Payer: Medicare HMO | Admitting: Internal Medicine

## 2019-10-13 ENCOUNTER — Telehealth: Payer: Self-pay | Admitting: Family Medicine

## 2019-10-13 NOTE — Telephone Encounter (Signed)
Okay to place order once they find a company?

## 2019-10-13 NOTE — Telephone Encounter (Signed)
Pts daughter is calling in stating that they need a referral/order for the pt b/c she needs respite care for 5 hrs a day twice a week for at least a month they are unsure of the company but will call around and call back to let us know.  Pts daughter is calling stating that the insurance will cover the care.

## 2019-10-14 DIAGNOSIS — I351 Nonrheumatic aortic (valve) insufficiency: Secondary | ICD-10-CM | POA: Diagnosis not present

## 2019-10-14 DIAGNOSIS — M159 Polyosteoarthritis, unspecified: Secondary | ICD-10-CM | POA: Diagnosis not present

## 2019-10-14 DIAGNOSIS — I129 Hypertensive chronic kidney disease with stage 1 through stage 4 chronic kidney disease, or unspecified chronic kidney disease: Secondary | ICD-10-CM | POA: Diagnosis not present

## 2019-10-14 DIAGNOSIS — G301 Alzheimer's disease with late onset: Secondary | ICD-10-CM | POA: Diagnosis not present

## 2019-10-14 DIAGNOSIS — H409 Unspecified glaucoma: Secondary | ICD-10-CM | POA: Diagnosis not present

## 2019-10-14 DIAGNOSIS — L299 Pruritus, unspecified: Secondary | ICD-10-CM | POA: Diagnosis not present

## 2019-10-14 DIAGNOSIS — F0280 Dementia in other diseases classified elsewhere without behavioral disturbance: Secondary | ICD-10-CM | POA: Diagnosis not present

## 2019-10-14 DIAGNOSIS — N183 Chronic kidney disease, stage 3 unspecified: Secondary | ICD-10-CM | POA: Diagnosis not present

## 2019-10-14 DIAGNOSIS — M549 Dorsalgia, unspecified: Secondary | ICD-10-CM | POA: Diagnosis not present

## 2019-10-14 NOTE — Telephone Encounter (Signed)
Yes,it is ok to place referral. Thanks, BJ

## 2019-10-15 NOTE — Telephone Encounter (Signed)
Noted. Will wait for daughter to call back and let me know where to send the referral.

## 2019-10-16 DIAGNOSIS — I129 Hypertensive chronic kidney disease with stage 1 through stage 4 chronic kidney disease, or unspecified chronic kidney disease: Secondary | ICD-10-CM | POA: Diagnosis not present

## 2019-10-16 DIAGNOSIS — G301 Alzheimer's disease with late onset: Secondary | ICD-10-CM | POA: Diagnosis not present

## 2019-10-16 DIAGNOSIS — F0280 Dementia in other diseases classified elsewhere without behavioral disturbance: Secondary | ICD-10-CM | POA: Diagnosis not present

## 2019-10-16 DIAGNOSIS — M159 Polyosteoarthritis, unspecified: Secondary | ICD-10-CM | POA: Diagnosis not present

## 2019-10-16 DIAGNOSIS — N183 Chronic kidney disease, stage 3 unspecified: Secondary | ICD-10-CM | POA: Diagnosis not present

## 2019-10-16 DIAGNOSIS — L299 Pruritus, unspecified: Secondary | ICD-10-CM | POA: Diagnosis not present

## 2019-10-16 DIAGNOSIS — H409 Unspecified glaucoma: Secondary | ICD-10-CM | POA: Diagnosis not present

## 2019-10-16 DIAGNOSIS — I351 Nonrheumatic aortic (valve) insufficiency: Secondary | ICD-10-CM | POA: Diagnosis not present

## 2019-10-16 DIAGNOSIS — M549 Dorsalgia, unspecified: Secondary | ICD-10-CM | POA: Diagnosis not present

## 2019-10-22 DIAGNOSIS — F0280 Dementia in other diseases classified elsewhere without behavioral disturbance: Secondary | ICD-10-CM | POA: Diagnosis not present

## 2019-10-22 DIAGNOSIS — H409 Unspecified glaucoma: Secondary | ICD-10-CM | POA: Diagnosis not present

## 2019-10-22 DIAGNOSIS — L299 Pruritus, unspecified: Secondary | ICD-10-CM | POA: Diagnosis not present

## 2019-10-22 DIAGNOSIS — I351 Nonrheumatic aortic (valve) insufficiency: Secondary | ICD-10-CM | POA: Diagnosis not present

## 2019-10-22 DIAGNOSIS — N183 Chronic kidney disease, stage 3 unspecified: Secondary | ICD-10-CM | POA: Diagnosis not present

## 2019-10-22 DIAGNOSIS — I129 Hypertensive chronic kidney disease with stage 1 through stage 4 chronic kidney disease, or unspecified chronic kidney disease: Secondary | ICD-10-CM | POA: Diagnosis not present

## 2019-10-22 DIAGNOSIS — G301 Alzheimer's disease with late onset: Secondary | ICD-10-CM | POA: Diagnosis not present

## 2019-10-22 DIAGNOSIS — M159 Polyosteoarthritis, unspecified: Secondary | ICD-10-CM | POA: Diagnosis not present

## 2019-10-22 DIAGNOSIS — M549 Dorsalgia, unspecified: Secondary | ICD-10-CM | POA: Diagnosis not present

## 2019-10-28 DIAGNOSIS — I129 Hypertensive chronic kidney disease with stage 1 through stage 4 chronic kidney disease, or unspecified chronic kidney disease: Secondary | ICD-10-CM | POA: Diagnosis not present

## 2019-10-28 DIAGNOSIS — M549 Dorsalgia, unspecified: Secondary | ICD-10-CM | POA: Diagnosis not present

## 2019-10-28 DIAGNOSIS — G301 Alzheimer's disease with late onset: Secondary | ICD-10-CM | POA: Diagnosis not present

## 2019-10-28 DIAGNOSIS — I351 Nonrheumatic aortic (valve) insufficiency: Secondary | ICD-10-CM | POA: Diagnosis not present

## 2019-10-28 DIAGNOSIS — H409 Unspecified glaucoma: Secondary | ICD-10-CM | POA: Diagnosis not present

## 2019-10-28 DIAGNOSIS — N183 Chronic kidney disease, stage 3 unspecified: Secondary | ICD-10-CM | POA: Diagnosis not present

## 2019-10-28 DIAGNOSIS — M159 Polyosteoarthritis, unspecified: Secondary | ICD-10-CM | POA: Diagnosis not present

## 2019-10-28 DIAGNOSIS — L299 Pruritus, unspecified: Secondary | ICD-10-CM | POA: Diagnosis not present

## 2019-10-28 DIAGNOSIS — F0280 Dementia in other diseases classified elsewhere without behavioral disturbance: Secondary | ICD-10-CM | POA: Diagnosis not present

## 2019-10-31 NOTE — Telephone Encounter (Signed)
I spoke with pt's daughter. They don't need the respite care any longer, insurance would not cover it. She will call us back if she needs anything or if she wants to order a hoyer lift.

## 2019-10-31 NOTE — Telephone Encounter (Signed)
I left pt's daughter a voicemail to call me back if she has any questions or if they have found a company for the respite care.

## 2019-11-05 ENCOUNTER — Other Ambulatory Visit: Payer: Self-pay | Admitting: Family Medicine

## 2019-11-10 DIAGNOSIS — Z7409 Other reduced mobility: Secondary | ICD-10-CM

## 2019-11-10 HISTORY — DX: Other reduced mobility: Z74.09

## 2019-11-11 ENCOUNTER — Encounter: Payer: Self-pay | Admitting: Cardiology

## 2019-11-13 ENCOUNTER — Telehealth: Payer: Medicare HMO | Admitting: Cardiology

## 2019-11-17 ENCOUNTER — Telehealth: Payer: Self-pay

## 2019-11-17 MED ORDER — DONEPEZIL HCL 5 MG PO TABS
5.0000 mg | ORAL_TABLET | Freq: Every day | ORAL | 2 refills | Status: DC
Start: 2019-11-17 — End: 2022-03-30

## 2019-11-17 NOTE — Telephone Encounter (Signed)
Rx has been sent in. 

## 2019-11-17 NOTE — Telephone Encounter (Signed)
Patients daughter called in requesting a refill request for patient for her medication. Says she has no more refills left    donepezil (ARICEPT) 5 MG tablet  [facility] [phone] - [place], [place]

## 2019-11-18 ENCOUNTER — Other Ambulatory Visit: Payer: Self-pay

## 2019-11-18 ENCOUNTER — Ambulatory Visit (INDEPENDENT_AMBULATORY_CARE_PROVIDER_SITE_OTHER): Payer: Medicare HMO

## 2019-11-18 DIAGNOSIS — Z742 Need for assistance at home and no other household member able to render care: Secondary | ICD-10-CM

## 2019-11-18 DIAGNOSIS — Z0000 Encounter for general adult medical examination without abnormal findings: Secondary | ICD-10-CM | POA: Diagnosis not present

## 2019-11-18 NOTE — Progress Notes (Signed)
Virtual Visit via Telephone Note  I connected with  Natalie Fitzgerald on 11/18/19 at 10:15 AM EST by telephone and verified that I am speaking with the correct person using two identifiers.  Medicare Annual Wellness visit completed telephonically due to Covid-19 pandemic.   Persons participating in this call: This Health Coach and this patient and [name]   Location: Patient: Home Provider: Office   I discussed the limitations, risks, security and privacy concerns of performing an evaluation and management service by telephone and the availability of in person appointments. The patient expressed understanding and agreed to proceed.  Unable to perform video visit due to video visit attempted and failed and/or patient does not have video capability.   Some vital signs may be absent or patient reported.   Willette Brace, LPN    Subjective:   Natalie Fitzgerald is a 86 y.o. female who presents for Medicare Annual (Subsequent) preventive examination.  Review of Systems     Cardiac Risk Factors include: advanced age (>84men, >71 women);hypertension;dyslipidemia;sedentary lifestyle     Objective:    There were no vitals filed for this visit. There is no height or weight on file to calculate BMI.  Advanced Directives 11/18/2019 09/23/2019 01/22/2019 09/17/2015  Does Patient Have a Medical Advance Directive? Yes No Yes Yes  Type of Advance Directive Out of facility DNR (pink MOST or yellow form) - Out of facility DNR (pink MOST or yellow form) -  Copy of [place] in Chart? - - - No - copy requested  Would patient like information on creating a medical advance directive? - Yes (ED - Information included in AVS) - -    Current Medications (verified) Outpatient Encounter Medications as of 11/18/2019  Medication Sig  . amLODipine (NORVASC) 5 MG tablet Take 1 tablet (5 mg total) by mouth at bedtime. TAKE 1 TABLET(5 MG) BY MOUTH DAILY  . donepezil (ARICEPT) 5  MG tablet Take 1 tablet (5 mg total) by mouth at bedtime.  . hydrALAZINE (APRESOLINE) 10 MG tablet TAKE 1 TABLET(10 MG) BY MOUTH EVERY 8 HOURS (Patient taking differently: Take 10 mg by mouth every 8 (eight) hours. At 0800, 1600, and 0000)  . metoprolol tartrate (LOPRESSOR) 25 MG tablet TAKE 1 TABLET(25 MG) BY MOUTH TWICE DAILY (Patient taking differently: Take 25 mg by mouth 2 (two) times daily. At 0800 and 1600)  . Cholecalciferol (VITAMIN D-3) 125 MCG (5000 UT) TABS Take 5,000 Units by mouth daily with breakfast. (Patient not taking: Reported on 11/18/2019)  . clotrimazole-betamethasone (LOTRISONE) cream Apply 1 application topically 2 (two) times daily. (Patient not taking: Reported on 11/18/2019)  . ferrous sulfate 325 (65 FE) MG tablet 1 tab every 2-3 days with vit C. (Patient not taking: Reported on 11/18/2019)  . Multiple Vitamins-Minerals (ONE-A-DAY WOMENS 50 PLUS) TABS Take 1 tablet by mouth daily with breakfast. (Patient not taking: Reported on 11/18/2019)  . vitamin B-12 1000 MCG tablet Take 1 tablet (1,000 mcg total) by mouth daily with breakfast. (Patient not taking: Reported on 11/18/2019)   No facility-administered encounter medications on file as of 11/18/2019.    Allergies (verified) Patient has no known allergies.   History: Past Medical History:  Diagnosis Date  . Aortic valve disorders    moderate aortic insufficiency  . Atypical mole    nose  . Decreased ambulation status 11/10/2019  . Dizziness   . Glaucoma   . Nipple problem    Past Surgical History:  Procedure Laterality  Date  . APPENDECTOMY    . BACK SURGERY     Family History  Problem Relation Age of Onset  . Cancer Mother        kidney  . Alcohol abuse Father    Social History   Socioeconomic History  . Marital status: Widowed    Spouse name: Not on file  . Number of children: Not on file  . Years of education: Not on file  . Highest education level: Not on file  Occupational History  . Not on file   Tobacco Use  . Smoking status: Never Smoker  . Smokeless tobacco: Never Used  Substance and Sexual Activity  . Alcohol use: No  . Drug use: No  . Sexual activity: Not on file  Other Topics Concern  . Not on file  Social History Narrative  . Not on file   Social Determinants of Health   Financial Resource Strain: Low Risk   . Difficulty of Paying Living Expenses: Not hard at all  Food Insecurity: No Food Insecurity  . Worried About Charity fundraiser in the Last Year: Never true  . Ran Out of Food in the Last Year: Never true  Transportation Needs: Unmet Transportation Needs  . Lack of Transportation (Medical): Yes  . Lack of Transportation (Non-Medical): Yes  Physical Activity: Inactive  . Days of Exercise per Week: 0 days  . Minutes of Exercise per Session: 0 min  Stress: Stress Concern Present  . Feeling of Stress : To some extent  Social Connections: Socially Isolated  . Frequency of Communication with Friends and Family: More than three times a week  . Frequency of Social Gatherings with Friends and Family: Never  . Attends Religious Services: Never  . Active Member of Clubs or Organizations: No  . Attends Archivist Meetings: Never  . Marital Status: Widowed    Tobacco Counseling Counseling given: Not Answered   Clinical Intake:  Pre-visit preparation completed: Yes  Pain : No/denies pain     BMI - recorded: 20.67 Nutritional Status: BMI of 19-24  Normal Nutritional Risks: Non-healing wound (dime size area looks like a start of a bed sore near sacral area of buttocks) Diabetes: No  How often do you need to have someone help you when you read instructions, pamphlets, or other written materials from your doctor or pharmacy?: 1 - Never  Diabetic?No  Interpreter Needed?: No  Information entered by :: Charlott Rakes, LPN   Activities of Daily Living In your present state of health, do you have any difficulty performing the following  activities: 11/18/2019 09/22/2019  Hearing? Y N  Comment mild loss -  Vision? N N  Difficulty concentrating or making decisions? Y N  Comment doesn't understand things as well -  Walking or climbing stairs? Y N  Comment non weight bearing -  Dressing or bathing? Y N  Comment daughter assist -  Doing errands, shopping? Tempie Donning  Preparing Food and eating ? Y -  Using the Toilet? Y -  In the past six months, have you accidently leaked urine? Y -  Do you have problems with loss of bowel control? Y -  Managing your Medications? Y -  Managing your Finances? Y -  Housekeeping or managing your Housekeeping? Y -  Comment daughter assist -  Some recent data might be hidden    Patient Care Team: Martinique, Betty G, MD as PCP - General (Family Medicine) Lorretta Harp, MD as PCP -  Cardiology (Cardiology)  Indicate any recent Medical Services you may have received from other than [facility] providers in the past year (date may be approximate).     Assessment:   This is a routine wellness examination for [facility].  Hearing/Vision screen  Hearing Screening   125Hz  250Hz  500Hz  1000Hz  2000Hz  3000Hz  4000Hz  6000Hz  8000Hz   Right ear:           Left ear:           Comments: Pt has mild loss  Vision Screening Comments: Pt has not followed up in several years for eye exams  Dietary issues and exercise activities discussed: Current Exercise Habits: The patient does not participate in regular exercise at present, Exercise limited by: Other - see comments (non weight bearing)  Goals    . Exercise 150 minutes per week (moderate activity)     Using the  Stretch for ARMS AND WORK WITH LEGS     . patient     Patient may move downstairs to make life easier;  No steps; can get outside; has a bathroom     . Patient Stated     Great skin care and keep her comfortable      Depression Screen PHQ 2/9 Scores 11/18/2019 12/24/2018 01/26/2015 01/22/2014  PHQ - 2 Score 2 0 0 0  PHQ- 9 Score 5 - - -    Fall  Risk Fall Risk  11/18/2019 12/24/2018 09/17/2015 01/26/2015 01/22/2014  Falls in the past year? 0 1 Yes Yes No  Number falls in past yr: 0 1 1 1  -  Injury with Fall? 0 0 - Yes -  Risk for fall due to : Other (Comment) Impaired balance/gait;Impaired mobility - Other (Comment) -  Risk for fall due to: Comment pt as become non weight bearing - - syncope episode -  Follow up Falls prevention discussed Education provided Education provided - -    Any stairs in or around the home? No  If so, are there any without handrails? No  Home free of loose throw rugs in walkways, pet beds, electrical cords, etc? Yes  Adequate lighting in your home to reduce risk of falls? Yes   ASSISTIVE DEVICES UTILIZED TO PREVENT FALLS:  Life alert? No  Use of a cane, walker or w/c? No   Wheel chair Grab bars in the bathroom? Yes  Shower chair or bench in shower? Yes  Elevated toilet seat or a handicapped toilet? No   TIMED UP AND GO:  Was the test performed? No .      Cognitive Function: MMSE - Mini Mental State Exam 05/21/2019 09/17/2015  Not completed: - (No Data)  Orientation to time 2 -  Orientation to Place 2 -  Registration 1 -  Attention/ Calculation 4 -  Attention/Calculation-comments Spelled WORLD backwards, missed R. Able to count from 100 by 5's until 80.Could not do it by 7's. -  Recall 1 -  Language- name 2 objects 2 -  Language- repeat 1 -  Language- follow 3 step command 1 -  Language- read & follow direction 1 -  Write a sentence 0 -  Copy design 1 -  Total score 16 -        Immunizations Immunization History  Administered Date(s) Administered  . Fluad Quad(high Dose 65+) 09/22/2019  . Influenza Split 11/30/2011  . Influenza Whole 10/24/2006, 10/22/2007, 10/06/2008  . Influenza, High Dose Seasonal PF 12/09/2014, 09/17/2015  . Influenza,inj,Quad PF,6+ Mos 12/23/2012, 10/23/2013  . [place] SARS-COVID-2 Vaccination  02/03/2019, 03/03/2019  . Pneumococcal Conjugate-13 01/22/2014  .  Pneumococcal-Unspecified 11/05/2010  . Td 12/23/2012    TDAP status: Up to date Flu Vaccine status: Up to date Pneumococcal vaccine status: Up to date Covid-19 vaccine status: Completed vaccines  Qualifies for Shingles Vaccine? Yes   Zostavax completed No   Shingrix Completed?: No.    Education has been provided regarding the importance of this vaccine. Patient has been advised to call insurance company to determine out of pocket expense if they have not yet received this vaccine. Advised may also receive vaccine at local pharmacy or Health Dept. Verbalized acceptance and understanding.  Screening Tests Health Maintenance  Topic Date Due  . DEXA SCAN  12/09/2019 (Originally 01/01/1995)  . TETANUS/TDAP  12/24/2022  . INFLUENZA VACCINE  Completed  . COVID-19 Vaccine  Completed  . PNA vac Low Risk Adult  Completed    Health Maintenance  There are no preventive care reminders to display for this patient.  Colorectal cancer screening: No longer required.  Mammogram status: No longer required.       Additional Screening:  Vision Screening: Recommended annual ophthalmology exams for early detection of glaucoma and other disorders of the eye. Is the patient up to date with their annual eye exam?  No  Who is the provider or what is the name of the office in which the patient attends annual eye exams? Pt has not been in several years     Dental Screening: Recommended annual dental exams for proper oral hygiene  Community Resource Referral / Chronic Care Management: CRR required this visit?  Yes   CCM required this visit?  Yes      Plan:     I have personally reviewed and noted the following in the patient's chart:   . Medical and social history . Use of alcohol, tobacco or illicit drugs  . Current medications and supplements . Functional ability and status . Nutritional status . Physical activity . Advanced directives . List of other physicians . Hospitalizations,  surgeries, and ER visits in previous 12 months . Vitals . Screenings to include cognitive, depression, and falls . Referrals and appointments  In addition, I have reviewed and discussed with patient certain preventive protocols, quality metrics, and best practice recommendations. A written personalized care plan for preventive services as well as general preventive health recommendations were provided to patient.     Willette Brace, LPN   [id number]/[id number]   Nurse Notes: Pt has become non weight bearing in the past few weeks. Pt daughter states that she has noticed in the past 5 days a small size of a dime area near her sacral crease. Pt is requesting what she can do to avoid any opening to that area because she is incontinent. Connected care notes have been notified for assistance to the daughter who wants to keep mom home and care for her she just would like resources to make that continue to happen.

## 2019-11-18 NOTE — Patient Instructions (Addendum)
Natalie Fitzgerald , Thank you for taking time to come for your Medicare Wellness Visit. I appreciate your ongoing commitment to your health goals. Please review the following plan we discussed and let me know if I can assist you in the future.   Screening recommendations/referrals: Colonoscopy: No longer required Mammogram: No longer required Bone Density: Pt declined and postponed until 12/09/19 Recommended yearly ophthalmology/optometry visit for glaucoma screening and checkup Recommended yearly dental visit for hygiene and checkup  Vaccinations: Influenza vaccine: Up to date Done 09/22/19 Pneumococcal vaccine: Up to date Tdap vaccine: Up to date Shingles vaccine: Shingrix discussed. Please contact your pharmacy for coverage information.    Covid-19:Completed 1/25, & 03/03/19   Advanced directives: Copies inchart  Conditions/risks identified: Great skin acre and keeping her comfortable   Next appointment: Follow up in one year for your annual wellness visit    Preventive Care 65 Years and Older, Female Preventive care refers to lifestyle choices and visits with your health care provider that can promote health and wellness. What does preventive care include?  A yearly physical exam. This is also called an annual well check.  Dental exams once or twice a year.  Routine eye exams. Ask your health care provider how often you should have your eyes checked.  Personal lifestyle choices, including:  Daily care of your teeth and gums.  Regular physical activity.  Eating a healthy diet.  Avoiding tobacco and drug use.  Limiting alcohol use.  Practicing safe sex.  Taking low-dose aspirin every day.  Taking vitamin and mineral supplements as recommended by your health care provider. What happens during an annual well check? The services and screenings done by your health care provider during your annual well check will depend on your age, overall health, lifestyle risk factors,  and family history of disease. Counseling  Your health care provider may ask you questions about your:  Alcohol use.  Tobacco use.  Drug use.  Emotional well-being.  Home and relationship well-being.  Sexual activity.  Eating habits.  History of falls.  Memory and ability to understand (cognition).  Work and work Statistician.  Reproductive health. Screening  You may have the following tests or measurements:  Height, weight, and BMI.  Blood pressure.  Lipid and cholesterol levels. These may be checked every 5 years, or more frequently if you are over 80 years old.  Skin check.  Lung cancer screening. You may have this screening every year starting at age 93 if you have a 30-pack-year history of smoking and currently smoke or have quit within the past 15 years.  Fecal occult blood test (FOBT) of the stool. You may have this test every year starting at age 41.  Flexible sigmoidoscopy or colonoscopy. You may have a sigmoidoscopy every 5 years or a colonoscopy every 10 years starting at age 37.  Hepatitis C blood test.  Hepatitis B blood test.  Sexually transmitted disease (STD) testing.  Diabetes screening. This is done by checking your blood sugar (glucose) after you have not eaten for a while (fasting). You may have this done every 1-3 years.  Bone density scan. This is done to screen for osteoporosis. You may have this done starting at age 16.  Mammogram. This may be done every 1-2 years. Talk to your health care provider about how often you should have regular mammograms. Talk with your health care provider about your test results, treatment options, and if necessary, the need for more tests. Vaccines  Your health care provider  may recommend certain vaccines, such as:  Influenza vaccine. This is recommended every year.  Tetanus, diphtheria, and acellular pertussis (Tdap, Td) vaccine. You may need a Td booster every 10 years.  Zoster vaccine. You may need  this after age 24.  Pneumococcal 13-valent conjugate (PCV13) vaccine. One dose is recommended after age 61.  Pneumococcal polysaccharide (PPSV23) vaccine. One dose is recommended after age 67. Talk to your health care provider about which screenings and vaccines you need and how often you need them. This information is not intended to replace advice given to you by your health care provider. Make sure you discuss any questions you have with your health care provider. Document Released: 01/22/2015 Document Revised: 09/15/2015 Document Reviewed: 10/27/2014 Elsevier Interactive Patient Education  2017 [employer] Prevention in the Home Falls can cause injuries. They can happen to people of all ages. There are many things you can do to make your home safe and to help prevent falls. What can I do on the outside of my home?  Regularly fix the edges of walkways and driveways and fix any cracks.  Remove anything that might make you trip as you walk through a door, such as a raised step or threshold.  Trim any bushes or trees on the path to your home.  Use bright outdoor lighting.  Clear any walking paths of anything that might make someone trip, such as rocks or tools.  Regularly check to see if handrails are loose or broken. Make sure that both sides of any steps have handrails.  Any raised decks and porches should have guardrails on the edges.  Have any leaves, snow, or ice cleared regularly.  Use sand or salt on walking paths during winter.  Clean up any spills in your garage right away. This includes oil or grease spills. What can I do in the bathroom?  Use night lights.  Install grab bars by the toilet and in the tub and shower. Do not use towel bars as grab bars.  Use non-skid mats or decals in the tub or shower.  If you need to sit down in the shower, use a plastic, non-slip stool.  Keep the floor dry. Clean up any water that spills on the floor as soon as it  happens.  Remove soap buildup in the tub or shower regularly.  Attach bath mats securely with double-sided non-slip rug tape.  Do not have throw rugs and other things on the floor that can make you trip. What can I do in the bedroom?  Use night lights.  Make sure that you have a light by your bed that is easy to reach.  Do not use any sheets or blankets that are too big for your bed. They should not hang down onto the floor.  Have a firm chair that has side arms. You can use this for support while you get dressed.  Do not have throw rugs and other things on the floor that can make you trip. What can I do in the kitchen?  Clean up any spills right away.  Avoid walking on wet floors.  Keep items that you use a lot in easy-to-reach places.  If you need to reach something above you, use a strong step stool that has a grab bar.  Keep electrical cords out of the way.  Do not use floor polish or wax that makes floors slippery. If you must use wax, use non-skid floor wax.  Do not have throw rugs  and other things on the floor that can make you trip. What can I do with my stairs?  Do not leave any items on the stairs.  Make sure that there are handrails on both sides of the stairs and use them. Fix handrails that are broken or loose. Make sure that handrails are as long as the stairways.  Check any carpeting to make sure that it is firmly attached to the stairs. Fix any carpet that is loose or worn.  Avoid having throw rugs at the top or bottom of the stairs. If you do have throw rugs, attach them to the floor with carpet tape.  Make sure that you have a light switch at the top of the stairs and the bottom of the stairs. If you do not have them, ask someone to add them for you. What else can I do to help prevent falls?  Wear shoes that:  Do not have high heels.  Have rubber bottoms.  Are comfortable and fit you well.  Are closed at the toe. Do not wear sandals.  If you  use a stepladder:  Make sure that it is fully opened. Do not climb a closed stepladder.  Make sure that both sides of the stepladder are locked into place.  Ask someone to hold it for you, if possible.  Clearly mark and make sure that you can see:  Any grab bars or handrails.  First and last steps.  Where the edge of each step is.  Use tools that help you move around (mobility aids) if they are needed. These include:  Canes.  Walkers.  Scooters.  Crutches.  Turn on the lights when you go into a dark area. Replace any light bulbs as soon as they burn out.  Set up your furniture so you have a clear path. Avoid moving your furniture around.  If any of your floors are uneven, fix them.  If there are any pets around you, be aware of where they are.  Review your medicines with your doctor. Some medicines can make you feel dizzy. This can increase your chance of falling. Ask your doctor what other things that you can do to help prevent falls. This information is not intended to replace advice given to you by your health care provider. Make sure you discuss any questions you have with your health care provider. Document Released: 10/22/2008 Document Revised: 06/03/2015 Document Reviewed: 01/30/2014 Elsevier Interactive Patient Education  2017 [employer].

## 2019-11-19 ENCOUNTER — Telehealth: Payer: Self-pay

## 2019-11-19 NOTE — Telephone Encounter (Signed)
    MA11/10/2019 1st Attempt  Name: Natalie Fitzgerald   MRN: [id number]   DOB: 1927/12/18   AGE: 86 y.o.   GENDER: female   PCP Martinique, Betty G, MD.   11/19/19 Spoke with patient's daughter [name] about SCAT, PACE, dental resources. [name] feels that she may need a referral for home health.  I will email resources to [name] per her request by Friday, Nov 12 and follow-up by phone to ensure patient has received the email.    Ninoska Goswick, AAS Paralegal, [place] . Embedded Care Coordination [facility]  Care Management  [address], [place] [address] [email or web address]  [phone]   [email or web address]

## 2019-11-20 ENCOUNTER — Telehealth: Payer: Self-pay

## 2019-11-20 NOTE — Telephone Encounter (Signed)
Patients daughter called in wanting to speak with nurse about getting patient set up with referral for home health. Message in chart from [facility] and she states that is not what she needed they sent a referral for PT and she doesn't need that she needs home health to help her with the patients ADLs     Please call and advise

## 2019-11-21 ENCOUNTER — Telehealth (INDEPENDENT_AMBULATORY_CARE_PROVIDER_SITE_OTHER): Payer: Medicare HMO | Admitting: Family Medicine

## 2019-11-21 ENCOUNTER — Encounter: Payer: Self-pay | Admitting: Family Medicine

## 2019-11-21 ENCOUNTER — Telehealth: Payer: Self-pay

## 2019-11-21 DIAGNOSIS — N1832 Chronic kidney disease, stage 3b: Secondary | ICD-10-CM

## 2019-11-21 DIAGNOSIS — M24542 Contracture, left hand: Secondary | ICD-10-CM | POA: Diagnosis not present

## 2019-11-21 DIAGNOSIS — I10 Essential (primary) hypertension: Secondary | ICD-10-CM

## 2019-11-21 DIAGNOSIS — R197 Diarrhea, unspecified: Secondary | ICD-10-CM

## 2019-11-21 NOTE — Progress Notes (Signed)
Virtual Visit via Video Note I connected with Natalie Fitzgerald and her daughter on 11/21/19 by a video enabled telemedicine application and verified that I am speaking with the correct person using two identifiers.  Location patient: home Location provider:work office Persons participating in the virtual visit: patient, daughter,provider Her daughter provides information. I discussed the limitations of evaluation and management by telemedicine and the availability of in person appointments. The patient expressed understanding and agreed to proceed.  HPI: Natalie Fitzgerald is a 86 yo female with hx of cognitive impairment/dementia,HTN,OA,and valvular disease ,whose daughter is concerned because left hand contractures. About a week ago her daughter noted her left hand forming a fist, she thought she had hand muscle spasm/cramp. She was hoping she will regain movement in a few days but she has not done so at this time. She is now moving thumb and index and improving.   Her daughter has checked neuro a few times and she has not had Natalie changes.  No focal deficit, joint edema or erythema.  She has Hx of OA and has had LE cramps. Yesterday she had some fatigue.  Negative for fever,sore throat,nasal congestion,rhinorrhea,CP,SOB,wheezing,palpitations,or skin rash.  HTN: She is on Hydralazine 10 mg tis,Amlodipine 5 mg daily,and Metoprolol Tartrate 25 mg bid. CKD III.  Negative for headache or edema.  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 She eats breakfast but daughter has a "hard time" having her eating supper.  Last night she started with loosed stools, last one a few minutes ago. She has had soft stools in the past intermittently but not as loose.  She is not having abdominal pain,N/V,dysuria,decreased urine output,or gross hematuria.  No sick contact or new foods.  She has had episodes of diarrhea intermittently for a while, last one a month ago. She wears pull ups.  Her daughter  is having a hard time providing full care. Natalie Fitzgerald needs help with most ADL's and all IADL's. We have used PT for [facility] to help with care but she doe snot feel PT will help at this time.  ROS: See pertinent positives and negatives per HPI.  Past Medical History:  Diagnosis Date  . Aortic valve disorders    moderate aortic insufficiency  . Atypical mole    nose  . Decreased ambulation status 11/10/2019  . Dizziness   . Glaucoma   . Nipple problem    Past Surgical History:  Procedure Laterality Date  . APPENDECTOMY    . BACK SURGERY     Family History  Problem Relation Age of Onset  . Cancer Mother        kidney  . Alcohol abuse Father    Social History   Socioeconomic History  . Marital status: Widowed    Spouse name: Not on file  . Number of children: Not on file  . Years of education: Not on file  . Highest education level: Not on file  Occupational History  . Not on file  Tobacco Use  . Smoking status: Never Smoker  . Smokeless tobacco: Never Used  Substance and Sexual Activity  . Alcohol use: No  . Drug use: No  . Sexual activity: Not on file  Other Topics Concern  . Not on file  Social History Narrative  . Not on file   Social Determinants of Health   Financial Resource Strain: Low Risk   . Difficulty of Paying Living Expenses: Not hard at all  Food Insecurity: No Food Insecurity  .  Worried About Charity fundraiser in the Last Year: Never true  . Ran Out of Food in the Last Year: Never true  Transportation Needs: Unmet Transportation Needs  . Lack of Transportation (Medical): Yes  . Lack of Transportation (Non-Medical): Yes  Physical Activity: Inactive  . Days of Exercise per Week: 0 days  . Minutes of Exercise per Session: 0 min  Stress: Stress Concern Present  . Feeling of Stress : To some extent  Social Connections: Socially Isolated  . Frequency of Communication with Friends and Family: More than three times a week  . Frequency of Social  Gatherings with Friends and Family: Never  . Attends Religious Services: Never  . Active Member of Clubs or Organizations: No  . Attends Archivist Meetings: Never  . Marital Status: Widowed  Intimate Partner Violence: Not At Risk  . Fear of Current or Ex-Partner: No  . Emotionally Abused: No  . Physically Abused: No  . Sexually Abused: No    Current Outpatient Medications:  .  amLODipine (NORVASC) 5 MG tablet, Take 1 tablet (5 mg total) by mouth at bedtime. TAKE 1 TABLET(5 MG) BY MOUTH DAILY, Disp: 90 tablet, Rfl: 2 .  Cholecalciferol (VITAMIN D-3) 125 MCG (5000 UT) TABS, Take 5,000 Units by mouth daily with breakfast. , Disp: , Rfl:  .  clotrimazole-betamethasone (LOTRISONE) cream, Apply 1 application topically 2 (two) times daily., Disp: 30 g, Rfl: 1 .  donepezil (ARICEPT) 5 MG tablet, Take 1 tablet (5 mg total) by mouth at bedtime., Disp: 90 tablet, Rfl: 2 .  ferrous sulfate 325 (65 FE) MG tablet, 1 tab every 2-3 days with vit C., Disp: 30 tablet, Rfl: 3 .  hydrALAZINE (APRESOLINE) 10 MG tablet, TAKE 1 TABLET(10 MG) BY MOUTH EVERY 8 HOURS (Patient taking differently: Take 10 mg by mouth every 8 (eight) hours. At 0800, 1600, and 0000), Disp: 90 tablet, Rfl: 3 .  metoprolol tartrate (LOPRESSOR) 25 MG tablet, TAKE 1 TABLET(25 MG) BY MOUTH TWICE DAILY (Patient taking differently: Take 25 mg by mouth 2 (two) times daily. At 0800 and 1600), Disp: 60 tablet, Rfl: 3 .  Multiple Vitamins-Minerals (ONE-A-DAY WOMENS 50 PLUS) TABS, Take 1 tablet by mouth daily with breakfast. , Disp: , Rfl:  .  vitamin B-12 1000 MCG tablet, Take 1 tablet (1,000 mcg total) by mouth daily with breakfast., Disp:  , Rfl:   EXAM:  VITALS per patient if applicable:N/A She has not had BP checked.  GENERAL: alert, appears well and in no acute distress. She is in bed.  HEENT: atraumatic, conjunctiva clear, no obvious abnormalities on inspection. Mucosa is moist.  NECK: normal movements of the head and  neck  LUNGS: on inspection no signs of respiratory distress, breathing rate appears normal, no obvious gross SOB, gasping or wheezing  CV: no obvious cyanosis  Natalie: Left hand with flexed IP joints. No edema or erythema appreciated. Able to move left thumb and index, good coordination. Limitation of active movement of IP of rest of fingers. Passive ROM is also limited and elicits some pain. Wrist flexion and extension is adequate.  PSYCH/NEURO: pleasant and cooperative, no obvious depression or anxiety, speech and thought processing grossly intact No facial focal deficit. She remembers year, missed month by one (October). She doe snot remember name of president of [place] but knows it is not Trump. Oriented in place and person.  ASSESSMENT AND PLAN:  Discussed the following assessment and plan:  Contracture of hand joint, left On  inspection/observation and based on hx, it does not seem to be an acute CVA/nero deficit. She could have a muscle cramp as a precipitating factor and because pain she avoided movement, therefore aggravating stiffness/OA. Monitor for new symptoms. Tylenol 500 mg 3-4 times per day and/or topical OTC icy hot/asper cream may help.  Hand fine ROM/PT will be beneficial. Her daughter does not think PT needs to be arrange, she can help with hand ROM exercises.  Diarrhea, unspecified type Hopefully it is transient. She was treated for UTI in 09/2019.  If persistent we will need to consider stool studies. Oral clear fluids with electrolytes, she is enjoying "flat" sprite. Instructed about warning signs.  Stage 3b chronic kidney disease (HCC) Adequate hydration and BP controlled.  Essential hypertension Continue same dose of Metoprolol tartrate,Amlodipine,and Hydralazine. Low salt diet. Risk of hypotension,so it is important to monitor BP a few times per week.  No problem-specific Assessment & Plan notes found for this encounter.   I discussed the assessment and  treatment plan with the patient. The patient was provided an opportunity to ask questions and all were answered. The patient agreed with the plan and demonstrated an understanding of the instructions.   Return if symptoms worsen or fail to improve.   Gradie Ohm Martinique, MD

## 2019-11-21 NOTE — Telephone Encounter (Signed)
    MA11/12/2019   Name: Natalie Fitzgerald   MRN: [id number]   DOB: 10-18-1927   AGE: 86 y.o.   GENDER: female   PCP Martinique, Betty G, MD.   11/21/19 Left message on voicemail for patient's [name] to return my call regarding resources emailed on 11/19/19.  I will call again next week.    [name], AAS Paralegal, [place] . Embedded Care Coordination [facility]  Care Management  [address], [place] [address] [email or web address]  [phone]   [email or web address]

## 2019-11-21 NOTE — Telephone Encounter (Signed)
Patient has appointment this morning, will discuss & place referral then.

## 2019-11-23 ENCOUNTER — Encounter: Payer: Self-pay | Admitting: Family Medicine

## 2019-11-24 ENCOUNTER — Telehealth: Payer: Self-pay

## 2019-11-24 NOTE — Telephone Encounter (Signed)
    MA11/15/2021 2nd Attempt  Name: Natalie Fitzgerald   MRN: [id number]   DOB: December 21, 1927   AGE: 86 y.o.   GENDER: female   PCP Martinique, Betty G, MD.   11/24/19  Left message on voicemail for patient's daughter [name] to return my call regarding resources emailed on 11/19/19.  I will call again later this week.    Simon Llamas, AAS Paralegal, [place] . Embedded Care Coordination [facility] Health  Care Management  [address], [place] [address] [email or web address]  [phone]   [email or web address]

## 2019-11-26 ENCOUNTER — Telehealth: Payer: Self-pay

## 2019-11-26 NOTE — Telephone Encounter (Signed)
    MA11/17/2021 3rd Attempt  Name: Natalie Fitzgerald   MRN: [id number]   DOB: 06/14/1927   AGE: 86 y.o.   GENDER: female   PCP Martinique, Betty G, MD.   11/26/19 Left message on voicemail for patient's daughter [name] to return my call regarding resources emailed on 11/19/19.  3rd attempt to contact patient's daughter closing referral.    Buelah Rennie, AAS Paralegal, [place] . Embedded Care Coordination [facility]  Care Management  [address], [place] [address] [email or web address]  [phone]   [email or web address]

## 2019-12-09 ENCOUNTER — Telehealth: Payer: Medicare HMO | Admitting: Cardiology

## 2019-12-12 ENCOUNTER — Telehealth: Payer: Self-pay | Admitting: Family Medicine

## 2019-12-12 ENCOUNTER — Other Ambulatory Visit: Payer: Medicare HMO | Admitting: Internal Medicine

## 2019-12-12 ENCOUNTER — Other Ambulatory Visit: Payer: Self-pay

## 2019-12-12 DIAGNOSIS — F0150 Vascular dementia without behavioral disturbance: Secondary | ICD-10-CM

## 2019-12-12 DIAGNOSIS — Z515 Encounter for palliative care: Secondary | ICD-10-CM

## 2019-12-12 NOTE — Telephone Encounter (Signed)
I spoke with Enid Derry, she is aware it is okay to switch to Hospice and Dr. Martinique will be the attending physician.

## 2019-12-12 NOTE — Telephone Encounter (Signed)
Natalie Fitzgerald is calling in to see if it is okay for the pt to switch to Hospice and is Dr. Martinique would continue to be the pts attending physician.  You can leave a detail msg on the secured voice mail.

## 2019-12-15 ENCOUNTER — Other Ambulatory Visit: Payer: Self-pay

## 2019-12-15 NOTE — Progress Notes (Signed)
[facility] Consult Note Telephone: [phone]  Fax: [phone]  PATIENT NAME: Natalie Fitzgerald DOB: [birth year] MRN: [id number]  PRIMARY CARE PROVIDER:   Martinique, Betty G, MD  REFERRING PROVIDER:  Martinique, Betty G, MD [address] [place],  [place] [address]  RESPONSIBLE PARTY:    Natalie Fitzgerald Daughter [phone]      RECOMMENDATIONS and PLAN: Palliative care encounter:  Z51.5  1.  Advance care planning:   DNAR and MOST forms were previously completed and on file.  Daughter is interestesd in changing treatment plan for patient to comfort without escalation of therapies.  She is in favor of Hospice care if patient is appropriate due to additional decline.  Palliative care will monitor patient until transition to Hospice care. Daughter is in agreement with plan.  2.  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  FAST stage is now 7d and PPS has decreased from a weak 40 to 20%.  Telephone discussion with Drs Natalie Fitzgerald and clinical assistant for Betty Martinique who agreed with terminal status of patient with a potential of <6 months if the natural process remains on the current trajectory.  Hospice at home referral was communicated with [facility] referral center for additional planning.   I spent 60 minutes providing this consultation,  from 1230 to 1330. More than 50% of the time in this consultation was spent coordinating communication  .   HISTORY OF PRESENT ILLNESS: Follow up with  Natalie Fitzgerald due to reports of decline over the past 2 months.  Daughter reports that patient is no longer able to sit up on her own or stand.  She has been eating only bites of food and skips numerous meals over the past 2 weeks.  She has apparent weight loss and is unable to be weighed due to immobility status. Current upper extremity diameter is 4" and waist 32". She continues to require total care for her ADLs, and is incontinent  or B&B.  No reports of recent seizure activity. Palliative Care was asked to help address goals of care.   CODE STATUS: DNAR/DNI  PPS: 20% HOSPICE ELIGIBILITY/DIAGNOSIS: YES/ Advanced vascular dementia, aortic valve disorder  PAST MEDICAL HISTORY:  Past Medical History:  Diagnosis Date  . Aortic valve disorders    moderate aortic insufficiency  . Atypical mole    nose  . Decreased ambulation status 11/10/2019  . Dizziness   . Glaucoma   . Nipple problem     SOCIAL HX: Lives with daughter/POA Social History   Tobacco Use  . Smoking status: Never Smoker  . Smokeless tobacco: Never Used  Substance Use Topics  . Alcohol use: No    ALLERGIES: No Known Allergies   PERTINENT MEDICATIONS:  Outpatient Encounter Medications as of 12/12/2019  Medication Sig  . amLODipine (NORVASC) 5 MG tablet Take 1 tablet (5 mg total) by mouth at bedtime. TAKE 1 TABLET(5 MG) BY MOUTH DAILY  . Cholecalciferol (VITAMIN D-3) 125 MCG (5000 UT) TABS Take 5,000 Units by mouth daily with breakfast.   . clotrimazole-betamethasone (LOTRISONE) cream Apply 1 application topically 2 (two) times daily.  Marland Kitchen donepezil (ARICEPT) 5 MG tablet Take 1 tablet (5 mg total) by mouth at bedtime.  . ferrous sulfate 325 (65 FE) MG tablet 1 tab every 2-3 days with vit C.  . hydrALAZINE (APRESOLINE) 10 MG tablet TAKE 1 TABLET(10 MG) BY MOUTH EVERY 8 HOURS (Patient taking differently: Take 10 mg by mouth  every 8 (eight) hours. At 0800, 1600, and 0000)  . metoprolol tartrate (LOPRESSOR) 25 MG tablet TAKE 1 TABLET(25 MG) BY MOUTH TWICE DAILY (Patient taking differently: Take 25 mg by mouth 2 (two) times daily. At 0800 and 1600)  . Multiple Vitamins-Minerals (ONE-A-DAY WOMENS 50 PLUS) TABS Take 1 tablet by mouth daily with breakfast.   . vitamin B-12 1000 MCG tablet Take 1 tablet (1,000 mcg total) by mouth daily with breakfast.   No facility-administered encounter medications on file as of 12/12/2019.    PHYSICAL EXAM:   General:  NAD, frail appearing, cachectic elderly female reclining in bed Cardiovascular: regular rate and rhythm Pulmonary: unlabored respirations Abdomen: soft, nontender, + bowel sounds GU: no suprapubic tenderness Extremities: no edema, decreased muscle mass Skin: exposed skin is intact Neurological: Alert, oriented to person and self. Psych:  Pleasantly confused  Natalie Lex, NP-C

## 2019-12-25 ENCOUNTER — Telehealth: Payer: Self-pay | Admitting: Family Medicine

## 2019-12-25 NOTE — Telephone Encounter (Signed)
The daughter [name] is wanting to know if they could start weaning pt off of Donzipil due to too many stools.

## 2020-01-07 ENCOUNTER — Encounter: Payer: Self-pay | Admitting: Family Medicine

## 2020-01-07 DIAGNOSIS — F0150 Vascular dementia without behavioral disturbance: Secondary | ICD-10-CM | POA: Insufficient documentation

## 2020-01-14 ENCOUNTER — Other Ambulatory Visit: Payer: Self-pay | Admitting: Family Medicine

## 2020-01-15 ENCOUNTER — Other Ambulatory Visit: Payer: Self-pay | Admitting: Family Medicine

## 2020-02-13 ENCOUNTER — Other Ambulatory Visit: Payer: Self-pay | Admitting: Family Medicine

## 2020-02-13 DIAGNOSIS — I10 Essential (primary) hypertension: Secondary | ICD-10-CM

## 2020-03-09 DEATH — deceased

## 2020-03-10 ENCOUNTER — Telehealth: Payer: Self-pay | Admitting: Family Medicine

## 2020-03-10 NOTE — Telephone Encounter (Addendum)
Called and spoke with Ms' Rodgers (Ms. Roseman daughter), who tells me that Ms Weiler passed away on 21-Mar-2020 at 4: 26 pm. She was a hospice pt. She was comfortable and in "not much pain", she died at home with her daughter on her side as she wanted.  Ms Corine Shelter tells me that the day before her death she was having some difficulty swallowing. She did not eat breakfast the date of death but she was drinking fluids.She had some intermittent episodes of confusion.  Same afternoon Ms. Trevathan c/o some back pain, her daughter gave her a drop of pain medication and right after her daughter noted she was not responsive,she has passed.  Ms. Corine Shelter has hospice/social worker helping her. She feels like she is dealing well with her mother's death. She is honoring her wishes and making funeral arrangements. She is grateful she died peacefully and at home.  Possible cause of death acute MI given her hx of HTN,CKD,and HLD.  Death certificate will be completed electronically, DAVE. Naveen Clardy Martinique, MD

## 2020-11-10 ENCOUNTER — Telehealth: Payer: Self-pay | Admitting: Family Medicine

## 2020-11-10 NOTE — Telephone Encounter (Signed)
I called patient regarding her AWV appt.  I spoke with her daughter [name].  She stated patient passed away March 17, 2020.

## 2020-11-30 ENCOUNTER — Ambulatory Visit: Payer: Medicare HMO

## 2021-02-28 ENCOUNTER — Encounter (HOSPITAL_COMMUNITY): Payer: Self-pay | Admitting: Emergency Medicine

## 2021-02-28 ENCOUNTER — Emergency Department (HOSPITAL_COMMUNITY)
Admission: EM | Admit: 2021-02-28 | Payer: Medicare HMO | Source: Home / Self Care | Attending: Student | Admitting: Student

## 2021-02-28 NOTE — ED Triage Notes (Signed)
Pt here from home with c/o fall  into the tub trying to get up from commode , pt found to be in afib rvr , received 900 of fluid from ems for low b/p and is currently being treated for colon CA

## 2021-05-17 IMAGING — CT CT HEAD W/O CM
4 series · 16 of 47 positions shown, 18 images · non-contrast
Comparison: None.

CLINICAL DATA: Encephalopathy, dizziness

EXAM:
CT HEAD WITHOUT CONTRAST
TECHNIQUE: Contiguous axial images were obtained from the base of the skull
through the vertex without intravenous contrast.

[Series 3: head without · axial · non-contrast · 0.42mm/px · z∈[+1328,+1448]mm · 7 of 32 slices shown, 9 images]
[im 4/32  brain]
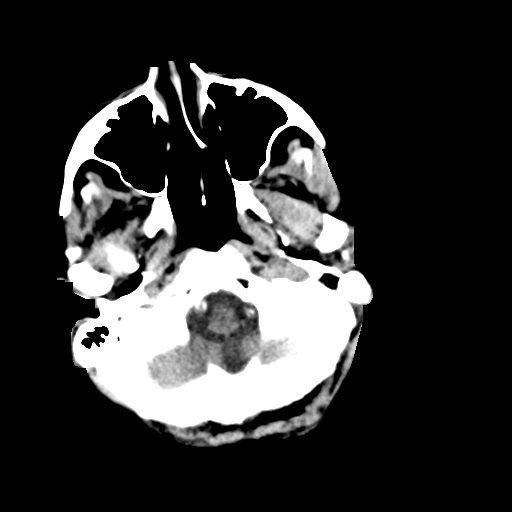
[im 4/32  bone]
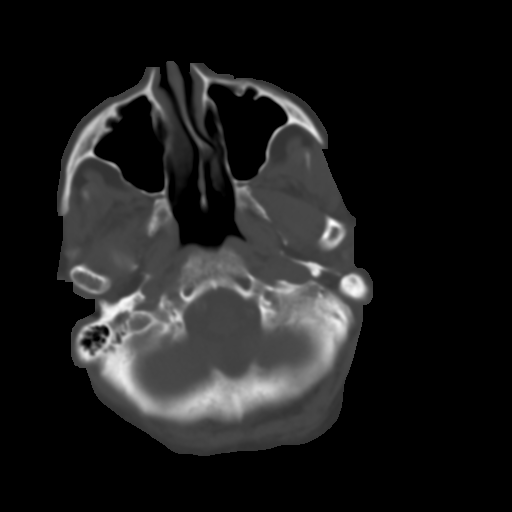
[im 8/32  brain]
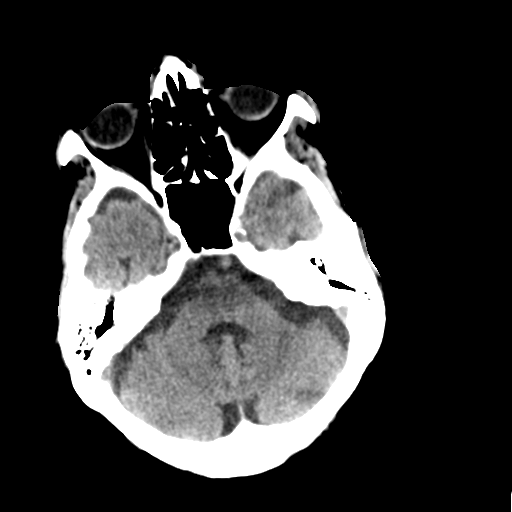
[im 12/32  brain]
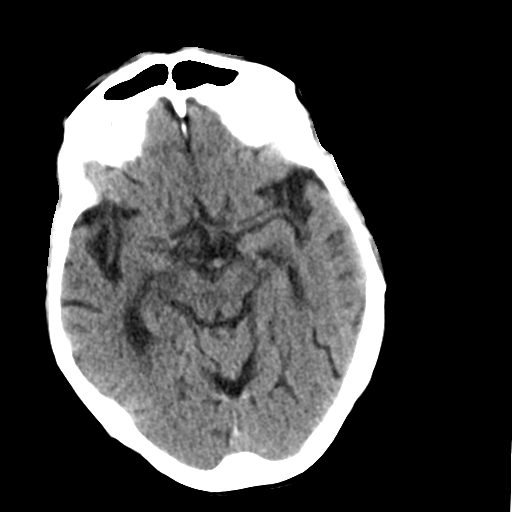
[im 16/32  brain]
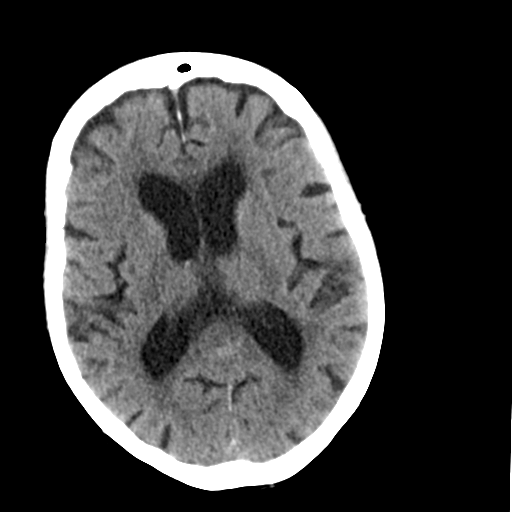
[im 20/32  brain]
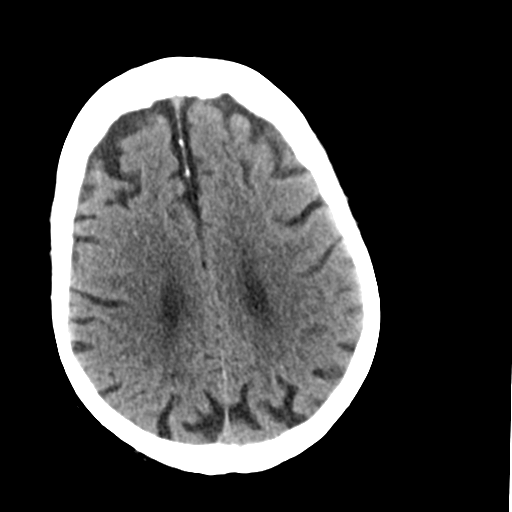
[im 20/32  bone]
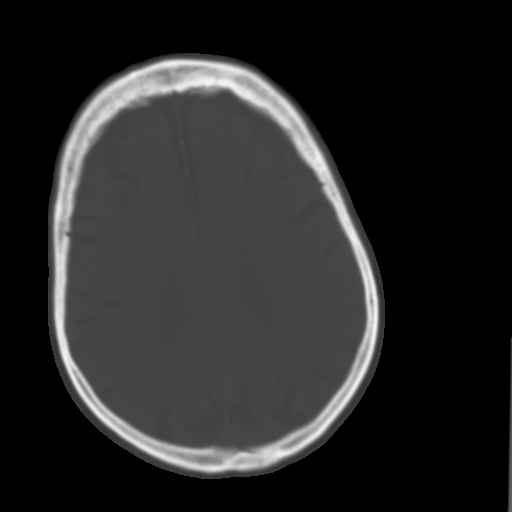
[im 24/32  brain]
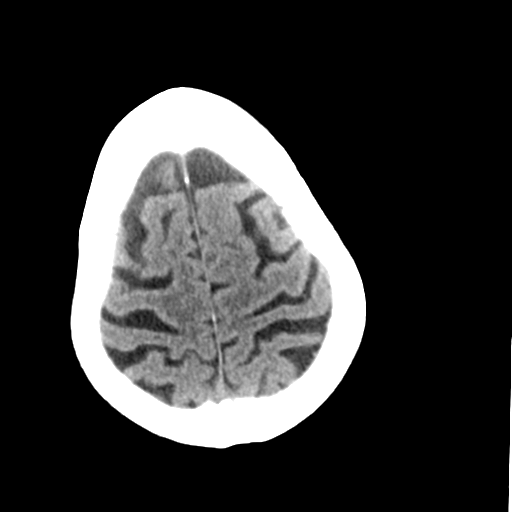
[im 28/32  brain]
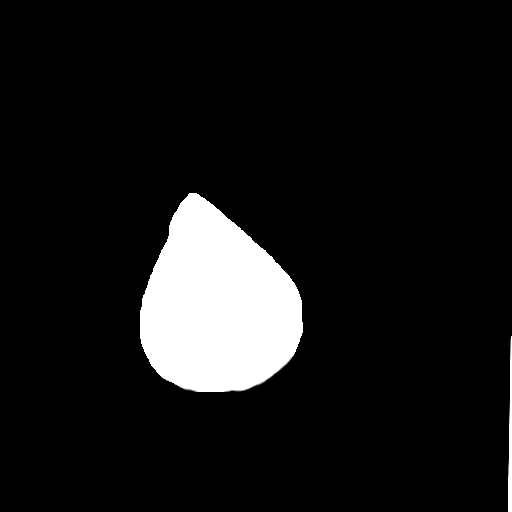

[Series 4: head bone · axial · 0.42mm/px · z∈[+1327,+1359]mm · 3 of 79 slices shown]
[im 8/79  bone]
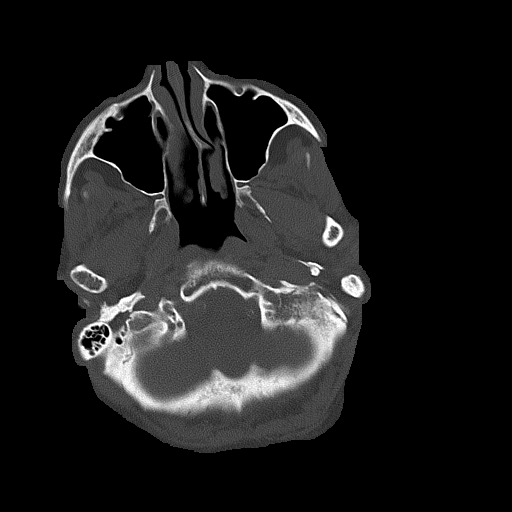
[im 16/79  bone]
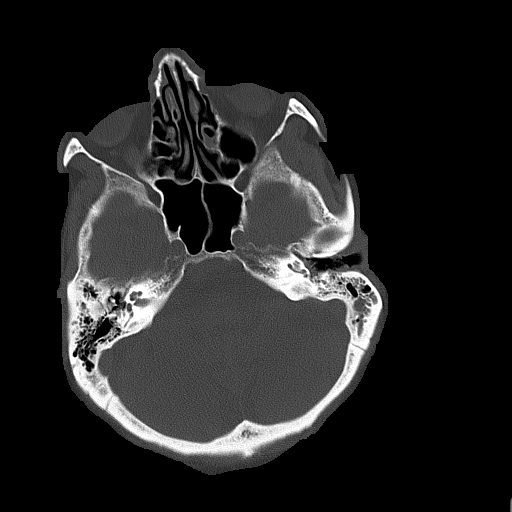
[im 24/79  bone]
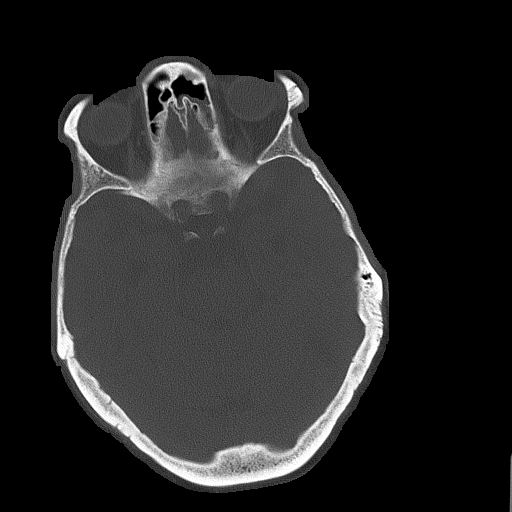

[Series 5: head without cor · coronal · non-contrast · 0.31mm/px · 3 of 68 slices shown]
[im 23/68  brain]
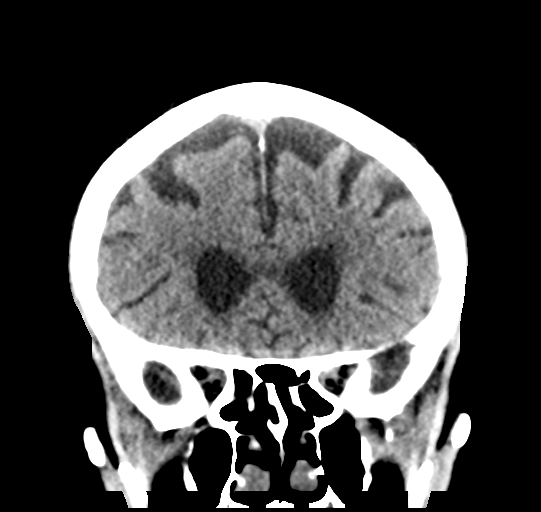
[im 30/68  brain]
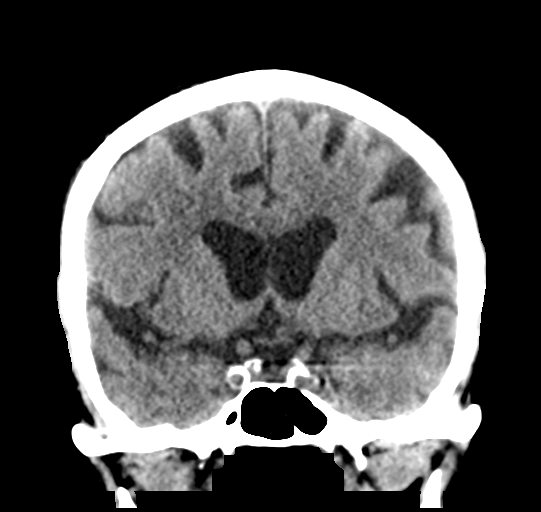
[im 38/68  brain]
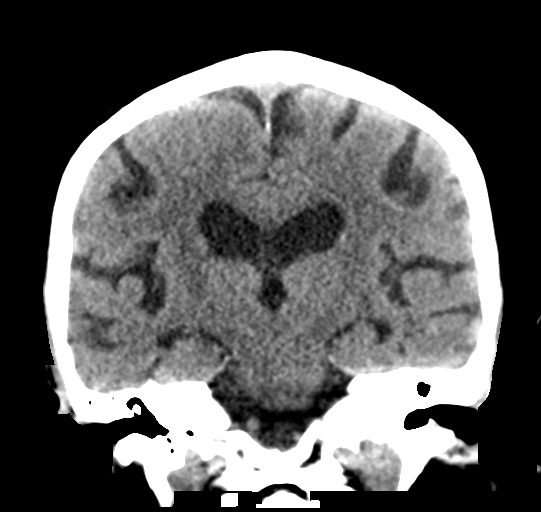

[Series 6: head without sag · sagittal · non-contrast · 0.31mm/px · 3 of 55 slices shown]
[im 19/55  brain]
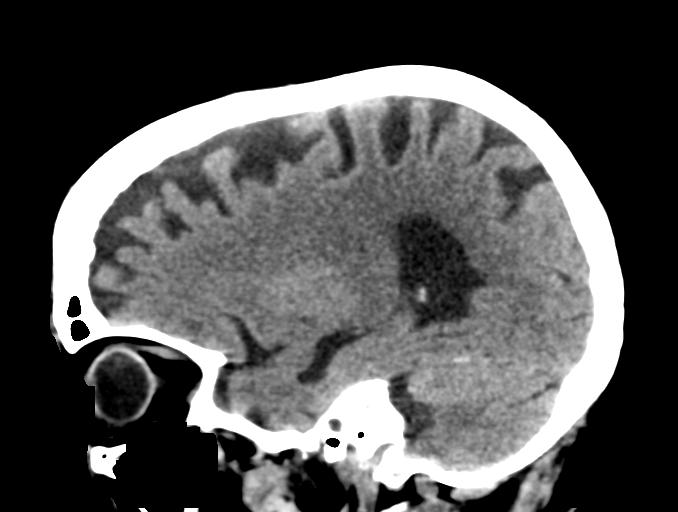
[im 28/55  brain]
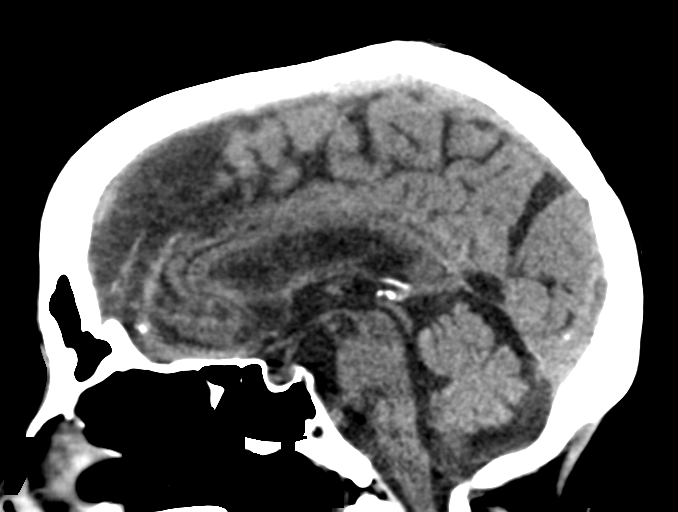
[im 37/55  brain]
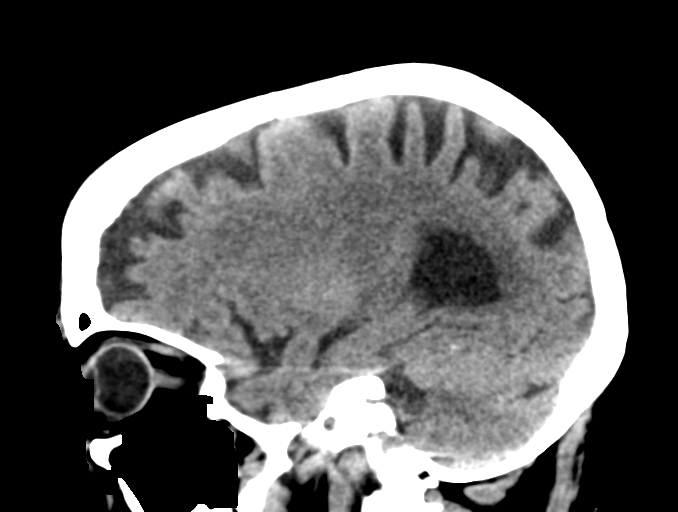

[16 of 47 positions shown; findings below may reference images not displayed]

FINDINGS: Brain: No evidence of acute infarction, hemorrhage, hydrocephalus,
extra-axial collection or mass lesion/mass effect. Symmetric
prominence of the ventricles, cisterns and sulci compatible with
moderate parenchymal volume loss. Patchy areas of white matter
hypoattenuation are most compatible with chronic microvascular
angiopathy.

Vascular: Atherosclerotic calcification of the carotid siphons and
intradural vertebral arteries. No hyperdense vessel.

Skull: No calvarial fracture or suspicious osseous lesion. No scalp
swelling or hematoma.

Sinuses/Orbits: Paranasal sinuses and mastoid air cells are
predominantly clear. Orbital structures are unremarkable aside from
prior lens extractions.

Other: Debris in the left external auditory canal.
IMPRESSION: 1. No acute intracranial abnormality.
2. Moderate parenchymal volume loss and chronic microvascular
angiopathy.
3. Intracranial atherosclerosis
4. Debris present in the left external auditory canal, correlate
with direct visualization to exclude a cerumen impaction.
